# Patient Record
Sex: Female | Born: 1950 | Race: White | Hispanic: No | Smoking: Never smoker
Health system: Southern US, Community
[De-identification: ages and names within clinical notes are randomized; demographics above are authoritative.]

## PROBLEM LIST (undated history)

## (undated) DIAGNOSIS — K219 Gastro-esophageal reflux disease without esophagitis: Secondary | ICD-10-CM

## (undated) DIAGNOSIS — L409 Psoriasis, unspecified: Secondary | ICD-10-CM

## (undated) DIAGNOSIS — N8110 Cystocele, unspecified: Secondary | ICD-10-CM

## (undated) DIAGNOSIS — K921 Melena: Secondary | ICD-10-CM

## (undated) DIAGNOSIS — K5790 Diverticulosis of intestine, part unspecified, without perforation or abscess without bleeding: Secondary | ICD-10-CM

## (undated) DIAGNOSIS — D500 Iron deficiency anemia secondary to blood loss (chronic): Secondary | ICD-10-CM

## (undated) DIAGNOSIS — E039 Hypothyroidism, unspecified: Secondary | ICD-10-CM

---

## 2020-11-27 LAB — COLOGUARD: COLOGUARD: POSITIVE — AB

## 2021-08-02 ENCOUNTER — Emergency Department (HOSPITAL_BASED_OUTPATIENT_CLINIC_OR_DEPARTMENT_OTHER): Payer: Medicare HMO

## 2021-08-02 ENCOUNTER — Emergency Department (HOSPITAL_BASED_OUTPATIENT_CLINIC_OR_DEPARTMENT_OTHER)
Admission: EM | Admit: 2021-08-02 | Discharge: 2021-08-03 | Disposition: A | Discharge status: Home / Self Care | Payer: Medicare HMO | Attending: Emergency Medicine | Admitting: Emergency Medicine

## 2021-08-02 ENCOUNTER — Encounter (HOSPITAL_BASED_OUTPATIENT_CLINIC_OR_DEPARTMENT_OTHER): Payer: Self-pay | Admitting: Radiology

## 2021-08-02 DIAGNOSIS — K529 Noninfective gastroenteritis and colitis, unspecified: Secondary | ICD-10-CM | POA: Diagnosis not present

## 2021-08-02 DIAGNOSIS — K5900 Constipation, unspecified: Secondary | ICD-10-CM | POA: Insufficient documentation

## 2021-08-02 DIAGNOSIS — R1013 Epigastric pain: Secondary | ICD-10-CM | POA: Diagnosis present

## 2021-08-02 LAB — COMPREHENSIVE METABOLIC PANEL
ALT: 18 U/L (ref 0–44)
AST: 17 U/L (ref 15–41)
Albumin: 4.2 g/dL (ref 3.5–5.0)
Alkaline Phosphatase: 58 U/L (ref 38–126)
Anion gap: 12 (ref 5–15)
BUN: 12 mg/dL (ref 8–23)
CO2: 28 mmol/L (ref 22–32)
Calcium: 9.4 mg/dL (ref 8.9–10.3)
Chloride: 96 mmol/L — ABNORMAL LOW (ref 98–111)
Creatinine, Ser: 0.59 mg/dL (ref 0.44–1.00)
GFR, Estimated: >60 mL/min (ref >60)
Glucose, Bld: 125 mg/dL — ABNORMAL HIGH (ref 70–99)
Potassium: 3.4 mmol/L — ABNORMAL LOW (ref 3.5–5.1)
Sodium: 136 mmol/L (ref 135–145)
Total Bilirubin: 0.5 mg/dL (ref 0.3–1.2)
Total Protein: 6.8 g/dL (ref 6.5–8.1)

## 2021-08-02 LAB — CBC
HCT: 29.9 % — ABNORMAL LOW (ref 36.0–46.0)
Hemoglobin: 9.8 g/dL — ABNORMAL LOW (ref 12.0–15.0)
MCH: 29.5 pg (ref 26.0–34.0)
MCHC: 32.8 g/dL (ref 30.0–36.0)
MCV: 90.1 fL (ref 80.0–100.0)
Platelets: 442 10*3/uL — ABNORMAL HIGH (ref 150–400)
RBC: 3.32 MIL/uL — ABNORMAL LOW (ref 3.87–5.11)
RDW: 13.4 % (ref 11.5–15.5)
WBC: 9.5 10*3/uL (ref 4.0–10.5)
nRBC: 0 % (ref 0.0–0.2)

## 2021-08-02 LAB — TROPONIN I (HIGH SENSITIVITY): Troponin I (High Sensitivity): 5 ng/L (ref <18)

## 2021-08-02 LAB — LIPASE, BLOOD: Lipase: <10 U/L — ABNORMAL LOW (ref 11–51)

## 2021-08-02 MED ORDER — ALUM & MAG HYDROXIDE-SIMETH 200-200-20 MG/5ML PO SUSP
30.0000 mL | Freq: Once | ORAL | Status: AC
  Administered 2021-08-02: 30 mL via ORAL
  Filled 2021-08-02: qty 30

## 2021-08-02 MED ORDER — FAMOTIDINE IN NACL 20-0.9 MG/50ML-% IV SOLN
20.0000 mg | Freq: Once | INTRAVENOUS | Status: AC
  Administered 2021-08-02: 20 mg via INTRAVENOUS
  Filled 2021-08-02: qty 50

## 2021-08-02 MED ORDER — ONDANSETRON 4 MG PO TBDP: 4.0000 mg | ORAL_TABLET | Freq: Once | ORAL | Status: DC | PRN

## 2021-08-02 MED ORDER — SODIUM CHLORIDE 0.9 % IV BOLUS
1000.0000 mL | Freq: Once | INTRAVENOUS | Status: AC
  Administered 2021-08-02: 1000 mL via INTRAVENOUS

## 2021-08-02 MED ORDER — ONDANSETRON HCL 4 MG/2ML IJ SOLN
4.0000 mg | Freq: Once | INTRAMUSCULAR | Status: AC
  Administered 2021-08-02: 4 mg via INTRAVENOUS
  Filled 2021-08-02: qty 2

## 2021-08-02 MED ORDER — IOHEXOL 350 MG/ML SOLN
100.0000 mL | Freq: Once | INTRAVENOUS | Status: AC | PRN
  Administered 2021-08-02: 50 mL via INTRAVENOUS

## 2021-08-02 NOTE — ED Provider Notes (Signed)
[facility]ERGENCY [facility] Emergency Department Provider Note MRN:  [id number]  Arrival date & time: 08/03/21     Chief Complaint   Abdominal Pain   History of Present Illness   Allison Finley is a 70 y.o. year-old female with no pertinent past medical history presenting to the ED with chief complaint of abdominal pain.  Location: [facility] Duration: 3 weeks but worse over the past 3 days Onset: Gradual Timing: Was intermittent but now it is constant Description: Sharp Severity: Moderate Exacerbating/Alleviating Factors: Worse after meals Associated Symptoms: Nausea, vomiting, constipation Pertinent Negatives: Denies chest pain or shortness of breath, no fever, no lower abdominal pain   Review of Systems  A complete 10 system review of systems was obtained and all systems are negative except as noted in the HPI and PMH.   Patient's Health History   History reviewed. No pertinent past medical history.    No family history on file.  Social History   Socioeconomic History   Marital status: Divorced    Spouse name: Not on file   Number of children: Not on file   Years of education: Not on file   Highest education level: Not on file  Occupational History   Not on file  Tobacco Use   Smoking status: Not on file   Smokeless tobacco: Not on file  Substance and Sexual Activity   Alcohol use: Not on file   Drug use: Not on file   Sexual activity: Not on file  Other Topics Concern   Not on file  Social History Narrative   Not on file   Social Determinants of Health   Financial Resource Strain: Not on file  Food Insecurity: Not on file  Transportation Needs: Not on file  Physical Activity: Not on file  Stress: Not on file  Social Connections: Not on file  Intimate Partner Violence: Not on file     Physical Exam   Vitals:   08/03/21 0145 08/03/21 0200  BP: 103/63   Pulse: 77 74  Resp: 18 19  Temp:    SpO2: 93% 95%    CONSTITUTIONAL:  Well-appearing, NAD NEURO:  Alert and oriented x 3, no focal deficits EYES:  eyes equal and reactive ENT/NECK:  no LAD, no JVD CARDIO: Regular rate, well-perfused, normal S1 and S2 PULM:  CTAB no wheezing or rhonchi GI/GU:  normal bowel sounds, non-distended, non-tender MSK/SPINE:  No gross deformities, no edema SKIN:  no rash, atraumatic PSYCH:  Appropriate speech and behavior  *Additional and/or pertinent findings included in MDM below  Diagnostic and Interventional Summary    EKG Interpretation  Date/Time:  Friday August 02 2021 23:41:20 EDT Ventricular Rate:  71 PR Interval:  144 QRS Duration: 98 QT Interval:  381 QTC Calculation: 414 R Axis:   77 Text Interpretation: Sinus rhythm Confirmed by Kennis Carina [phone]) on 08/03/2021 1:48:19 AM       Labs Reviewed  LIPASE, BLOOD - Abnormal; Notable for the following components:      Result Value   Lipase <10 (*)    All other components within normal limits  COMPREHENSIVE METABOLIC PANEL - Abnormal; Notable for the following components:   Potassium 3.4 (*)    Chloride 96 (*)    Glucose, Bld 125 (*)    All other components within normal limits  CBC - Abnormal; Notable for the following components:   RBC 3.32 (*)    Hemoglobin 9.8 (*)    HCT 29.9 (*)    Platelets  442 (*)    All other components within normal limits  URINALYSIS, ROUTINE W REFLEX MICROSCOPIC - Abnormal; Notable for the following components:   Specific Gravity, Urine 1.031 (*)    Ketones, ur 40 (*)    All other components within normal limits  TROPONIN I (HIGH SENSITIVITY)  TROPONIN I (HIGH SENSITIVITY)    CT ABDOMEN PELVIS W CONTRAST  Final Result      Medications  ondansetron (ZOFRAN-ODT) disintegrating tablet 4 mg (has no administration in time range)  ondansetron (ZOFRAN) injection 4 mg (4 mg Intravenous Given 08/02/21 2229)  sodium chloride 0.9 % bolus 1,000 mL (0 mLs Intravenous Stopped 08/03/21 0033)  famotidine (PEPCID) IVPB 20 mg premix (0  mg Intravenous Stopped 08/02/21 2343)  alum & mag hydroxide-simeth (MAALOX/MYLANTA) 200-200-20 MG/5ML suspension 30 mL (30 mLs Oral Given 08/02/21 2307)  iohexol (OMNIPAQUE) 350 MG/ML injection 100 mL (50 mLs Intravenous Contrast Given 08/02/21 2315)     Procedures  /  Critical Care Procedures  ED Course and Medical Decision Making  I have reviewed the triage vital signs, the nursing notes, and pertinent available records from the EMR.  Listed above are laboratory and imaging tests that I personally ordered, reviewed, and interpreted and then considered in my medical decision making (see below for details).  Differential diagnosis includes gastritis, pancreatitis, biliary colic, SBO.  Also considering atypical presentation of ACS.  Awaiting EKG, labs, CT.     CT is overall reassuring, demonstrating an enteritis.  Patient continues to look and feel well, normal vital signs, appropriate for discharge.  Elmer Sow. Pilar Plate, MD [facility] Emergency Medicine [facility] [email or web address]  Final Clinical Impressions(s) / ED Diagnoses     ICD-10-CM   1. Enteritis  K52.9       ED Discharge Orders          Ordered    omeprazole (PRILOSEC) 20 MG capsule  Daily        08/03/21 0212    ondansetron (ZOFRAN ODT) 4 MG disintegrating tablet  Every 8 hours PRN        08/03/21 0212    sucralfate (CARAFATE) 1 g tablet  4 times daily PRN        08/03/21 0212             Discharge Instructions Discussed with and Provided to Patient:     Discharge Instructions      You were evaluated in the Emergency Department and after careful evaluation, we did not find any emergent condition requiring admission or further testing in the hospital.  Your exam/testing today is overall reassuring.  Symptoms seem to be due to an enteritis or inflammation of the small bowels, possibly due to a virus.  You may also have some underlying acid reflux disease.  Recommend taking the omeprazole  daily to prevent pain.  Take the Carafate medication up to 4 times daily for discomfort as needed for immediate relief.  Use the Zofran as needed for nausea.  If symptoms continue, recommend follow-up with primary care doctor.  Please return to the Emergency Department if you experience any worsening of your condition.   Thank you for allowing [place] to be a part of your care.        Sabas Sous, MD 08/03/21 [phone]

## 2021-08-02 NOTE — ED Triage Notes (Signed)
Pt to ED from home with c/o abd pain/NVD x48 hours. Pt states she has not been able to hold down water or solid food for the past 24 hours. Pt states that pain is upper abd and radiates to the left and right side.

## 2021-08-03 LAB — URINALYSIS, ROUTINE W REFLEX MICROSCOPIC
Bilirubin Urine: NEGATIVE
Glucose, UA: NEGATIVE mg/dL
Hgb urine dipstick: NEGATIVE
Ketones, ur: 40 mg/dL — AB
Leukocytes,Ua: NEGATIVE
Nitrite: NEGATIVE
Protein, ur: NEGATIVE mg/dL
Specific Gravity, Urine: 1.031 — ABNORMAL HIGH (ref 1.005–1.030)
pH: 7.5 (ref 5.0–8.0)

## 2021-08-03 MED ORDER — ONDANSETRON 4 MG PO TBDP
4.0000 mg | ORAL_TABLET | Freq: Three times a day (TID) | ORAL | 0 refills | Status: DC | PRN
Start: 2021-08-03 — End: 2024-05-19

## 2021-08-03 MED ORDER — SUCRALFATE 1 G PO TABS: 1.0000 g | ORAL_TABLET | Freq: Four times a day (QID) | ORAL | 0 refills | Status: DC | PRN

## 2021-08-03 MED ORDER — OMEPRAZOLE 20 MG PO CPDR: 20.0000 mg | DELAYED_RELEASE_CAPSULE | Freq: Every day | ORAL | 1 refills | Status: DC

## 2021-08-03 NOTE — Discharge Instructions (Addendum)
You were evaluated in the Emergency Department and after careful evaluation, we did not find any emergent condition requiring admission or further testing in the hospital.  Your exam/testing today is overall reassuring.  Symptoms seem to be due to an enteritis or inflammation of the small bowels, possibly due to a virus.  You may also have some underlying acid reflux disease.  Recommend taking the omeprazole daily to prevent pain.  Take the Carafate medication up to 4 times daily for discomfort as needed for immediate relief.  Use the Zofran as needed for nausea.  If symptoms continue, recommend follow-up with primary care doctor.  Please return to the Emergency Department if you experience any worsening of your condition.   Thank you for allowing [place] to be a part of your care.

## 2021-08-03 NOTE — ED Notes (Signed)
Pt verbalizes understanding of discharge instructions. Opportunity for questioning and answers were provided. Armand removed by staff, pt discharged from ED to home. Educated to pick up Rx.  

## 2021-12-22 HISTORY — PX: CATARACT EXTRACTION: SUR2

## 2023-01-17 IMAGING — CT CT ABD-PELV W/ CM
2 of 5 series · 16 of 46 positions shown, 18 images · IV contrast (APPLIED)
Comparison: None.

CLINICAL DATA: Nausea and vomiting with abdominal pain for 2 days,
initial encounter

EXAM:
CT ABDOMEN AND PELVIS WITH CONTRAST
TECHNIQUE: Multidetector CT imaging of the abdomen and pelvis was performed
using the standard protocol following bolus administration of
intravenous contrast.
CONTRAST:  50mL OMNIPAQUE IOHEXOL 350 MG/ML SOLN

[Series 2: abd pel w · axial · 0.64mm/px · z∈[+810,+1190]mm · 13 of 86 slices shown, 15 images]
[im 5/86  soft-tissue]
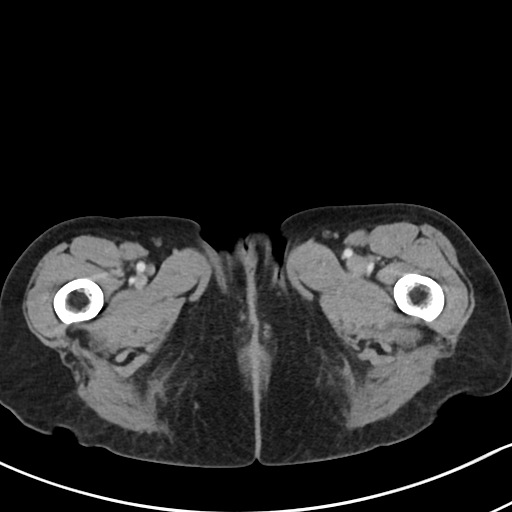
[im 5/86  bone]
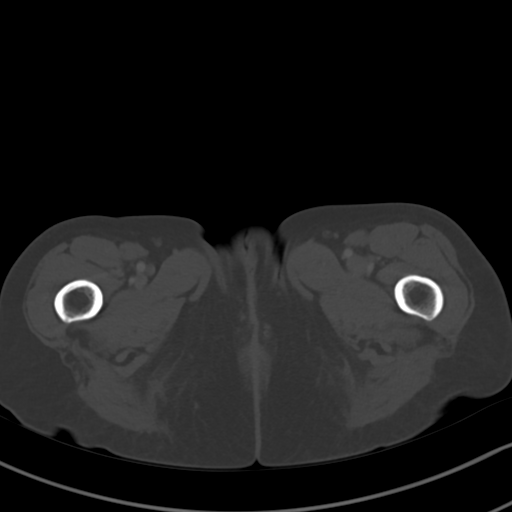
[im 10/86  soft-tissue]
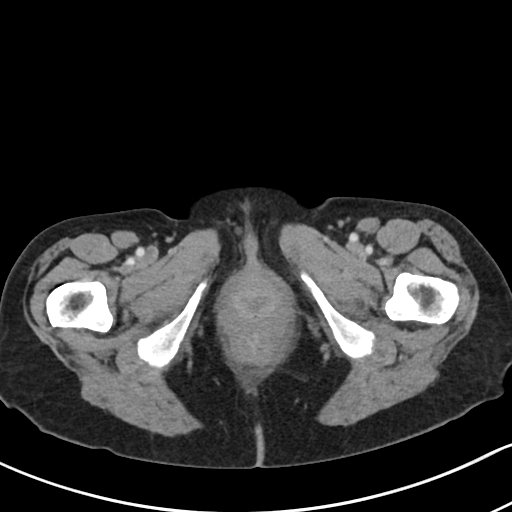
[im 19/86  soft-tissue]
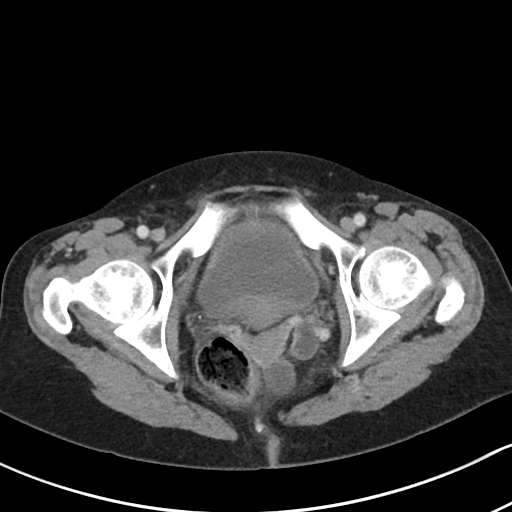
[im 24/86  soft-tissue]
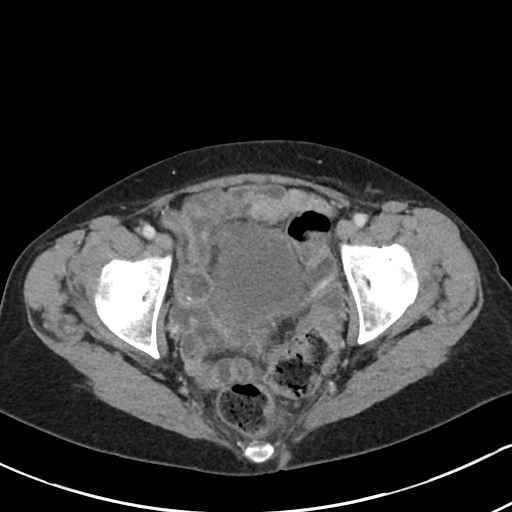
[im 29/86  soft-tissue]
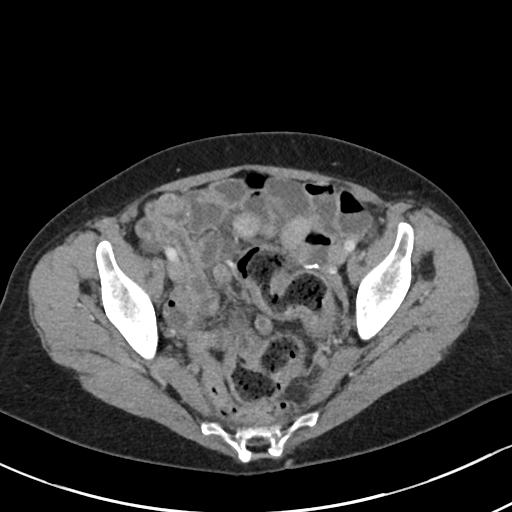
[im 38/86  soft-tissue]
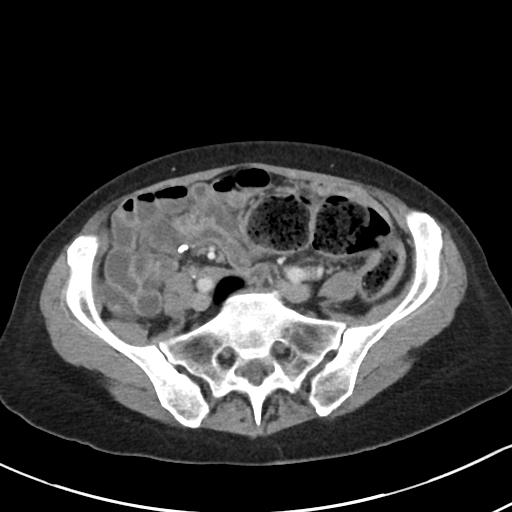
[im 43/86  soft-tissue]
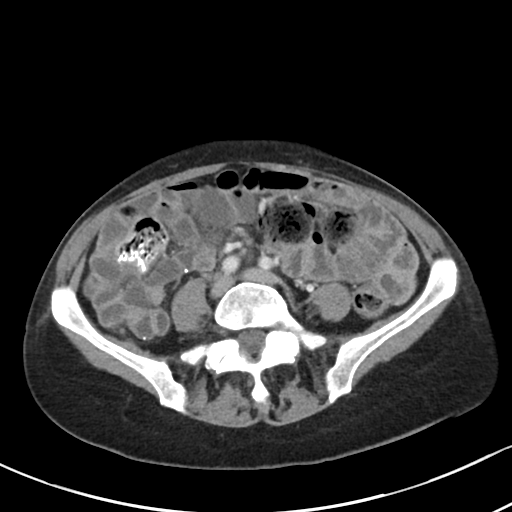
[im 48/86  soft-tissue]
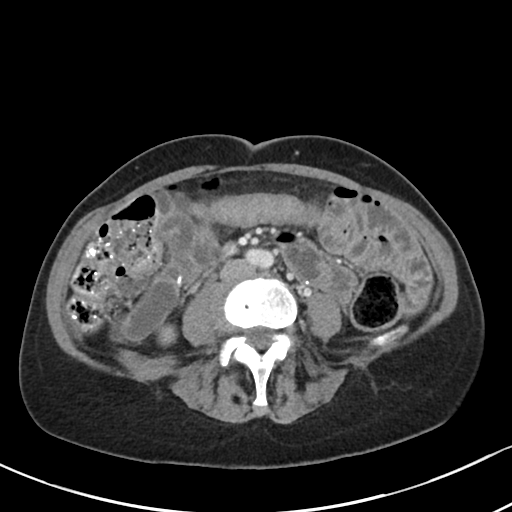
[im 57/86  soft-tissue]
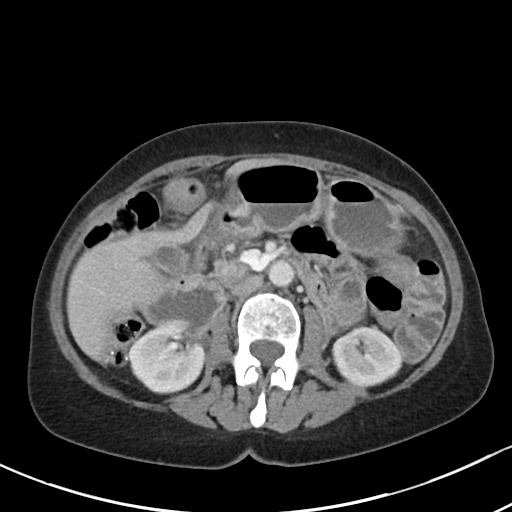
[im 57/86  bone]
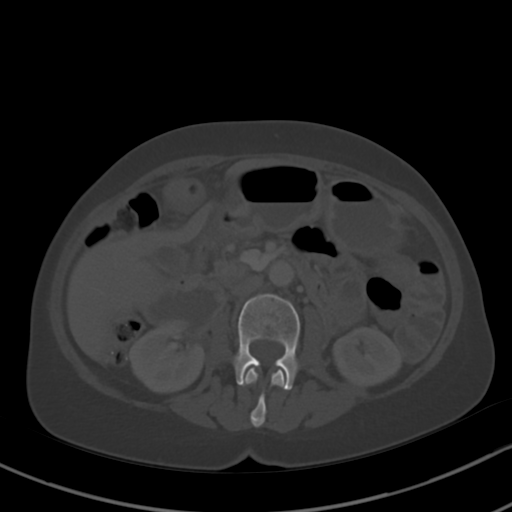
[im 62/86  soft-tissue]
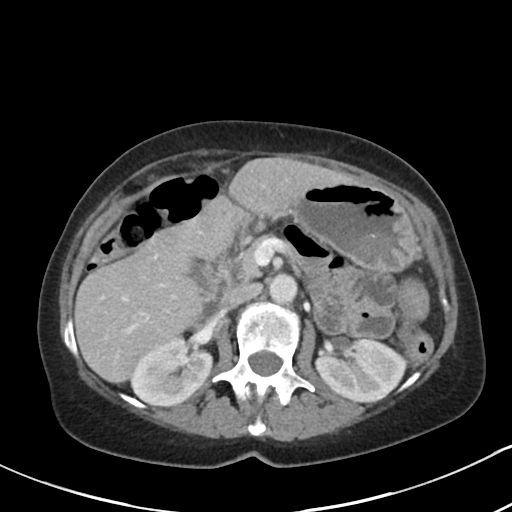
[im 67/86  soft-tissue]
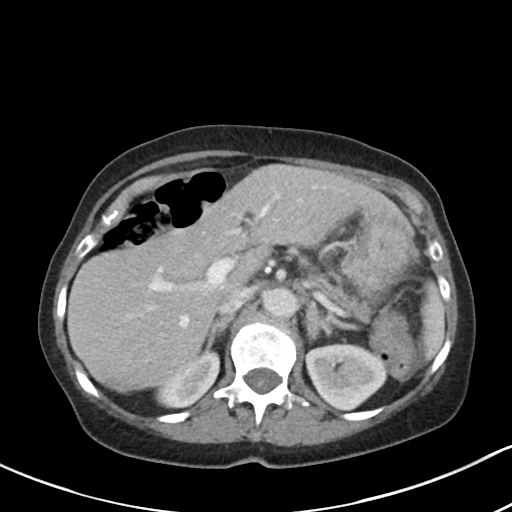
[im 76/86  soft-tissue]
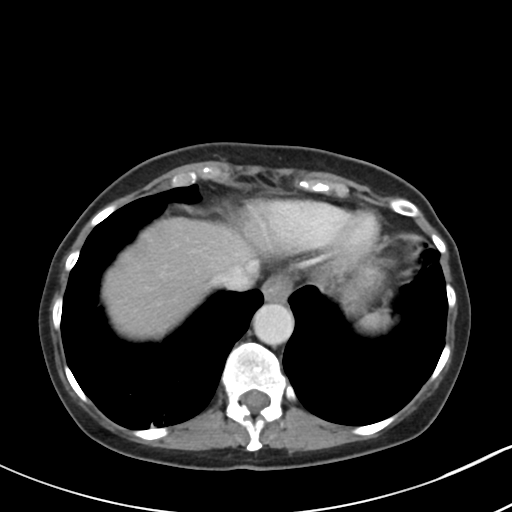
[im 81/86  soft-tissue]
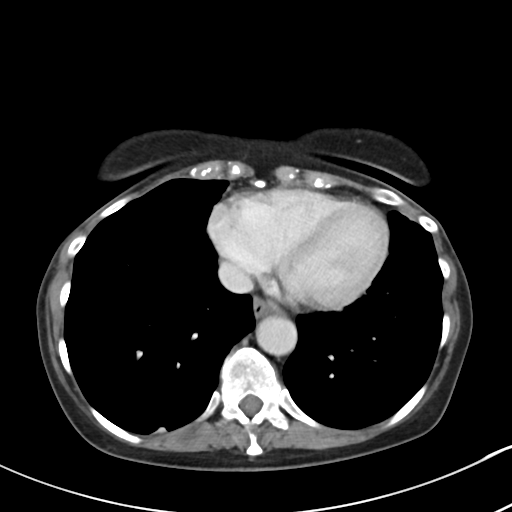

[Series 5: coronal · coronal · 0.67mm/px · 3 of 71 slices shown]
[im 24/71  soft-tissue]
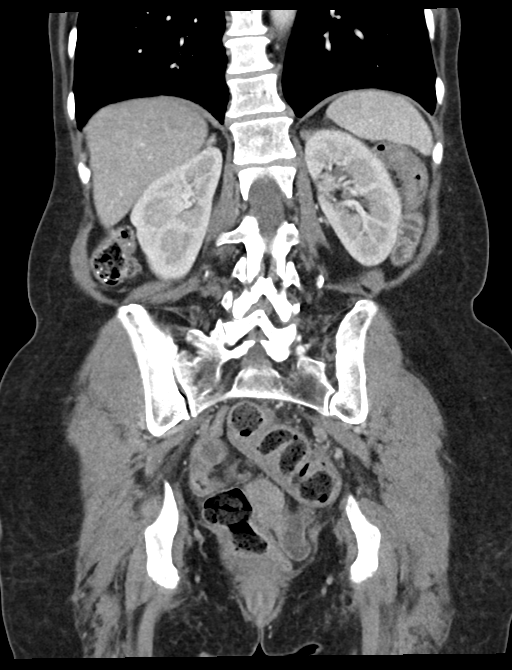
[im 32/71  soft-tissue]
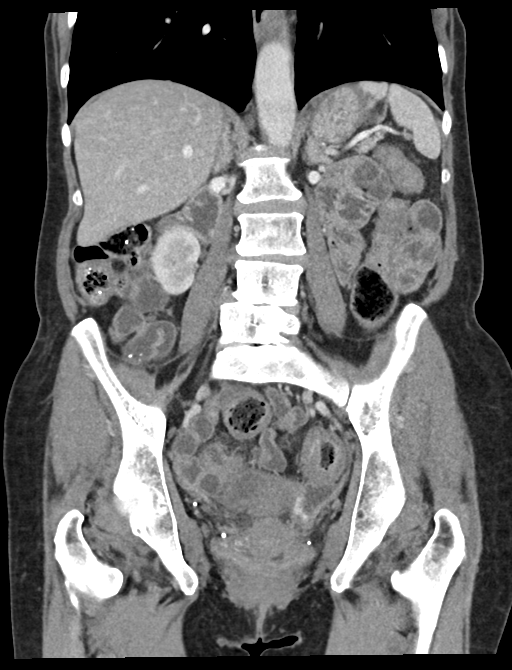
[im 39/71  soft-tissue]
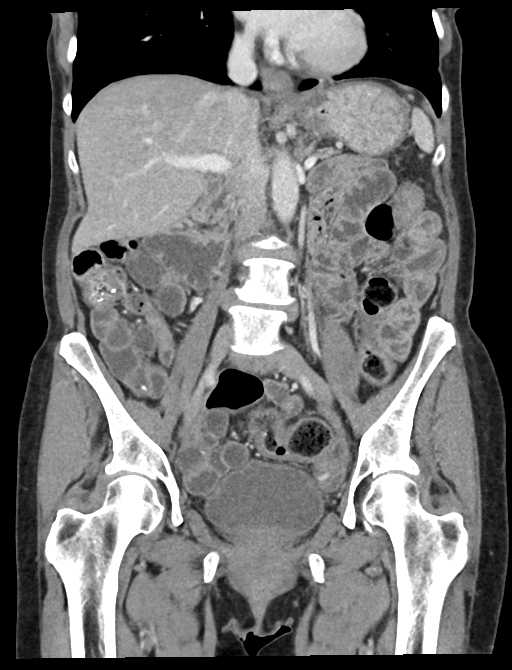

[16 of 46 positions shown; findings below may reference images not displayed]

FINDINGS: Lower chest: Scarring is noted in the right lower lobe

Hepatobiliary: Fatty infiltration of the liver is noted. Gallbladder
is within normal limits.

Pancreas: Unremarkable. No pancreatic ductal dilatation or
surrounding inflammatory changes.

Spleen: Normal in size without focal abnormality.

Adrenals/Urinary Tract: Adrenal glands are with within normal
limits. Kidneys demonstrate normal enhancement pattern bilaterally.
Normal excretion is noted on delayed images. No obstructive calculi
are seen. Ureters are within normal limits. Bladder is partially
distended.

Stomach/Bowel: Fecal material is noted throughout the left colon
consistent with a mild degree of constipation. Fluid-filled loops of
small bowel are noted without obstructive change. This may represent
a degree of enteritis. Stomach is unremarkable.

Vascular/Lymphatic: Aortic atherosclerosis. No enlarged abdominal or
pelvic lymph nodes.

Reproductive: Uterus and bilateral adnexa are unremarkable.

Other: No abdominal wall hernia or abnormality. No abdominopelvic
ascites.

Musculoskeletal: No acute or significant osseous findings.
IMPRESSION: Changes of mild colonic constipation.

Multiple fluid-filled loops of small bowel without obstructive
change. This may represent some mild enteritis.

## 2024-05-19 ENCOUNTER — Inpatient Hospital Stay (HOSPITAL_COMMUNITY)
Admission: EM | Admit: 2024-05-19 | Discharge: 2024-05-24 | DRG: 386 | Disposition: A | Discharge status: Home / Self Care | Attending: Internal Medicine | Admitting: Internal Medicine

## 2024-05-19 ENCOUNTER — Emergency Department (HOSPITAL_COMMUNITY)

## 2024-05-19 ENCOUNTER — Encounter (HOSPITAL_COMMUNITY): Payer: Self-pay

## 2024-05-19 ENCOUNTER — Other Ambulatory Visit: Payer: Self-pay

## 2024-05-19 DIAGNOSIS — K921 Melena: Secondary | ICD-10-CM | POA: Diagnosis not present

## 2024-05-19 DIAGNOSIS — Z79890 Hormone replacement therapy (postmenopausal): Secondary | ICD-10-CM

## 2024-05-19 DIAGNOSIS — K5130 Ulcerative (chronic) rectosigmoiditis without complications: Secondary | ICD-10-CM

## 2024-05-19 DIAGNOSIS — N814 Uterovaginal prolapse, unspecified: Secondary | ICD-10-CM | POA: Diagnosis present

## 2024-05-19 DIAGNOSIS — Z91018 Allergy to other foods: Secondary | ICD-10-CM

## 2024-05-19 DIAGNOSIS — K51911 Ulcerative colitis, unspecified with rectal bleeding: Principal | ICD-10-CM | POA: Diagnosis present

## 2024-05-19 DIAGNOSIS — K922 Gastrointestinal hemorrhage, unspecified: Principal | ICD-10-CM

## 2024-05-19 DIAGNOSIS — K219 Gastro-esophageal reflux disease without esophagitis: Secondary | ICD-10-CM | POA: Diagnosis present

## 2024-05-19 DIAGNOSIS — K5730 Diverticulosis of large intestine without perforation or abscess without bleeding: Secondary | ICD-10-CM | POA: Diagnosis present

## 2024-05-19 DIAGNOSIS — K5190 Ulcerative colitis, unspecified, without complications: Secondary | ICD-10-CM | POA: Diagnosis present

## 2024-05-19 DIAGNOSIS — N8110 Cystocele, unspecified: Secondary | ICD-10-CM | POA: Diagnosis present

## 2024-05-19 DIAGNOSIS — R809 Proteinuria, unspecified: Secondary | ICD-10-CM | POA: Diagnosis present

## 2024-05-19 DIAGNOSIS — K5900 Constipation, unspecified: Secondary | ICD-10-CM | POA: Diagnosis present

## 2024-05-19 DIAGNOSIS — Z91014 Allergy to mammalian meats: Secondary | ICD-10-CM

## 2024-05-19 DIAGNOSIS — R609 Edema, unspecified: Secondary | ICD-10-CM | POA: Diagnosis present

## 2024-05-19 DIAGNOSIS — K529 Noninfective gastroenteritis and colitis, unspecified: Secondary | ICD-10-CM

## 2024-05-19 DIAGNOSIS — A09 Infectious gastroenteritis and colitis, unspecified: Secondary | ICD-10-CM | POA: Diagnosis present

## 2024-05-19 DIAGNOSIS — E785 Hyperlipidemia, unspecified: Secondary | ICD-10-CM | POA: Diagnosis present

## 2024-05-19 DIAGNOSIS — Z79899 Other long term (current) drug therapy: Secondary | ICD-10-CM

## 2024-05-19 DIAGNOSIS — D500 Iron deficiency anemia secondary to blood loss (chronic): Secondary | ICD-10-CM | POA: Diagnosis present

## 2024-05-19 DIAGNOSIS — K644 Residual hemorrhoidal skin tags: Secondary | ICD-10-CM | POA: Diagnosis present

## 2024-05-19 DIAGNOSIS — E871 Hypo-osmolality and hyponatremia: Secondary | ICD-10-CM | POA: Diagnosis present

## 2024-05-19 DIAGNOSIS — E039 Hypothyroidism, unspecified: Secondary | ICD-10-CM | POA: Diagnosis present

## 2024-05-19 DIAGNOSIS — D509 Iron deficiency anemia, unspecified: Secondary | ICD-10-CM

## 2024-05-19 DIAGNOSIS — K8689 Other specified diseases of pancreas: Secondary | ICD-10-CM | POA: Diagnosis present

## 2024-05-19 LAB — COMPREHENSIVE METABOLIC PANEL WITH GFR
ALT: 19 U/L (ref 0–44)
AST: 18 U/L (ref 15–41)
Albumin: 3 g/dL — ABNORMAL LOW (ref 3.5–5.0)
Alkaline Phosphatase: 59 U/L (ref 38–126)
Anion gap: 9 (ref 5–15)
BUN: 11 mg/dL (ref 8–23)
CO2: 25 mmol/L (ref 22–32)
Calcium: 8.8 mg/dL — ABNORMAL LOW (ref 8.9–10.3)
Chloride: 99 mmol/L (ref 98–111)
Creatinine, Ser: 0.7 mg/dL (ref 0.44–1.00)
GFR, Estimated: >60 mL/min (ref >60)
Glucose, Bld: 106 mg/dL — ABNORMAL HIGH (ref 70–99)
Potassium: 4.2 mmol/L (ref 3.5–5.1)
Sodium: 133 mmol/L — ABNORMAL LOW (ref 135–145)
Total Bilirubin: 0.5 mg/dL (ref 0.0–1.2)
Total Protein: 6.2 g/dL — ABNORMAL LOW (ref 6.5–8.1)

## 2024-05-19 LAB — CBC WITH DIFFERENTIAL/PLATELET
Abs Immature Granulocytes: 0.1 10*3/uL — ABNORMAL HIGH (ref 0.00–0.07)
Basophils Absolute: 0.1 10*3/uL (ref 0.0–0.1)
Basophils Relative: 1 %
Eosinophils Absolute: 0.6 10*3/uL — ABNORMAL HIGH (ref 0.0–0.5)
Eosinophils Relative: 6 %
HCT: 31.2 % — ABNORMAL LOW (ref 36.0–46.0)
Hemoglobin: 10 g/dL — ABNORMAL LOW (ref 12.0–15.0)
Immature Granulocytes: 1 %
Lymphocytes Relative: 16 %
Lymphs Abs: 1.8 10*3/uL (ref 0.7–4.0)
MCH: 27.9 pg (ref 26.0–34.0)
MCHC: 32.1 g/dL (ref 30.0–36.0)
MCV: 87.2 fL (ref 80.0–100.0)
Monocytes Absolute: 1.6 10*3/uL — ABNORMAL HIGH (ref 0.1–1.0)
Monocytes Relative: 14 %
Neutro Abs: 7 10*3/uL (ref 1.7–7.7)
Neutrophils Relative %: 62 %
Platelets: 349 10*3/uL (ref 150–400)
RBC: 3.58 MIL/uL — ABNORMAL LOW (ref 3.87–5.11)
RDW: 12.8 % (ref 11.5–15.5)
WBC: 11.2 10*3/uL — ABNORMAL HIGH (ref 4.0–10.5)
nRBC: 0 % (ref 0.0–0.2)

## 2024-05-19 LAB — CBC
HCT: 33.8 % — ABNORMAL LOW (ref 36.0–46.0)
Hemoglobin: 11 g/dL — ABNORMAL LOW (ref 12.0–15.0)
MCH: 28.4 pg (ref 26.0–34.0)
MCHC: 32.5 g/dL (ref 30.0–36.0)
MCV: 87.1 fL (ref 80.0–100.0)
Platelets: 397 10*3/uL (ref 150–400)
RBC: 3.88 MIL/uL (ref 3.87–5.11)
RDW: 13.1 % (ref 11.5–15.5)
WBC: 12.5 10*3/uL — ABNORMAL HIGH (ref 4.0–10.5)
nRBC: 0 % (ref 0.0–0.2)

## 2024-05-19 LAB — URINALYSIS, ROUTINE W REFLEX MICROSCOPIC
Bilirubin Urine: NEGATIVE
Glucose, UA: NEGATIVE mg/dL
Ketones, ur: 5 mg/dL — AB
Nitrite: NEGATIVE
Protein, ur: 30 mg/dL — AB
Specific Gravity, Urine: 1.027 (ref 1.005–1.030)
pH: 5 (ref 5.0–8.0)

## 2024-05-19 LAB — TYPE AND SCREEN
ABO/RH(D): A POS
Antibody Screen: NEGATIVE

## 2024-05-19 LAB — POC OCCULT BLOOD, ED: Fecal Occult Bld: POSITIVE — AB

## 2024-05-19 MED ORDER — LACTATED RINGERS IV BOLUS
1000.0000 mL | Freq: Once | INTRAVENOUS | Status: AC
  Administered 2024-05-19: 1000 mL via INTRAVENOUS

## 2024-05-19 MED ORDER — LORAZEPAM 2 MG/ML PO CONC: 2.0000 mg | Freq: Once | ORAL | Status: DC | PRN

## 2024-05-19 MED ORDER — IOHEXOL 350 MG/ML SOLN
70.0000 mL | Freq: Once | INTRAVENOUS | Status: AC | PRN
  Administered 2024-05-19: 70 mL via INTRAVENOUS

## 2024-05-19 MED ORDER — PANTOPRAZOLE SODIUM 40 MG IV SOLR
80.0000 mg | Freq: Once | INTRAVENOUS | Status: AC
  Administered 2024-05-19: 80 mg via INTRAVENOUS
  Filled 2024-05-19: qty 20

## 2024-05-19 MED ORDER — HYDROCORTISONE 1 % EX CREA
TOPICAL_CREAM | Freq: Three times a day (TID) | CUTANEOUS | Status: DC
  Administered 2024-05-20 – 2024-05-21 (×2): 1 via TOPICAL
  Filled 2024-05-19: qty 28

## 2024-05-19 MED ORDER — PANTOPRAZOLE SODIUM 20 MG PO TBEC
20.0000 mg | DELAYED_RELEASE_TABLET | Freq: Every day | ORAL | Status: DC
  Administered 2024-05-20 – 2024-05-24 (×4): 20 mg via ORAL
  Filled 2024-05-19 (×5): qty 1

## 2024-05-19 MED ORDER — ROSUVASTATIN CALCIUM 5 MG PO TABS
5.0000 mg | ORAL_TABLET | Freq: Every day | ORAL | Status: DC
  Administered 2024-05-19 – 2024-05-24 (×5): 5 mg via ORAL
  Filled 2024-05-19 (×7): qty 1

## 2024-05-19 MED ORDER — LEVOTHYROXINE SODIUM 112 MCG PO TABS
112.0000 ug | ORAL_TABLET | Freq: Every day | ORAL | Status: DC
  Administered 2024-05-20 – 2024-05-24 (×4): 112 ug via ORAL
  Filled 2024-05-19 (×4): qty 1

## 2024-05-19 NOTE — Hospital Course (Addendum)
 Allison Finley is a 64 F with a history of diverticular disease, GERD, hypothyroidism, hyperlipidemia, herpes zoster who presented to the ED for bloody diarrhea and was admitted to the [facility]MT[facility] on 5/29 for further workup and management of hematochezia.    Hematochezia Hemorrhoids  Concern for colitis on imaging Leukocytosis (12.5, 11.2) Patient is a well-appearing and hemodynamically stable 73 yo who presents for 2-3 week history of diarrheal illness/hematochezia. Pertinent positives include multiple, daily melanotic stools, dizziness, and weakness. Pertinent negatives include absent fever, no nausea/vomiting, no weight loss, no confusion, and no sick contacts. CBC on admission remarkable for Hgb 11 and 10 on repeat as well as positive FOBT. Rectal exam performed by ED provider positive for external, non-thrombosed hemorrhoids. No tenderness, distention, guarding, rebound, or active bleeding noted on exam. CTA GI bleed protocol showed findings concerning for active inflammation consistent with colitis of the cecum, ascending colon, and rectosigmoid as well as constipation.    Concern for lower GI bleed given CBC from 4 months ago with Hgb 14 now 10 in the setting of positive FOBT, history of diverticular disease, and findings consistent with colitis on CTA. Low concern for perforation/peritonitis based on reassuring physical exam. GI consulted in ED for colonoscopy when appropriate. Last colonoscopy in 2022 reassuring. Given patient's well appearance and current inflammation GI may defer colonoscopy to outpatient setting. Appreciate their recommendations. Bleeding could also be from hemorrhoids although they appear asymptomatic at this time. Given history of low Hgb in the past have also ordered iron panel to help characterize anemia. Will monitor Hgb and transfuse as needed. Stopped IV PPI in the absence of active bleeding and resumed home PPI dose.    Bleeding and diarrhea could be concerning for  dysentery given mild leukocytosis, although patient does appear well and has remained afebrile. Agree with GI panel given high risk features including age >44 and persistence of symptoms for more than 1 week. Will defer antibiotic therapy until GI panel returns. Separately, diarrhea following meals and in the absence of a rash could be due to due to a more mild gastric enteritis that should resolve with time. Patient has only had 1 episode of diarrhea today. Loose stools could also be encopresis due to constipation seen on imaging. Other diagnoses to consider that are lower on the differential given labs/imaging/clinical presentation include diverticular bleed, ischemic colitis, microscopic colitis, mesenteric ischemia, and protein allergy.  Patient was given lactated Ringer 's in the ED.  Started on PPI 20 mg daily.  On evaluation in the morning, patient was stable, denied any dizziness or lightheadedness.  Denies any chest pain or shortness of breath.  Denied any acute abdominal pain reported soreness in the lower abdomen.  Tolerating p.o. okay.  Reports that she had 2 bowel movements this morning.  Labs this morning showed stable hemoglobin of 10.1 compared to yesterday 10.0; iron/TIBC showed low saturation ratio 5, iron 16, ferritin 22.  GI on board, plan for outpatient colonoscopy.  Will plan on outpatient IV iron supplementation.   Pancreas CT abdomen/pelvis 2022 for nausea/vomiting did not show pancreatic ductal dilatation or surrounding inflammatory changes. CT here noted atrophy of the body and tail of the pancreas with mild dilation of the main pancreatic duct. No discrete mass noted.Changes possibly related to chronic pancreatitis. Today patient denies epigastric pain, Alk phos WNL. Recommend outpatient monitoring should patient become symptomatic.   Proteinuria Present in UA. Asymptomatic. Recommend following up with PCP.    Chronic health problems   Hypothyroidism TSH  of 5.120 WNL on  03/04/2024 ([facility]). On levothyroxine  112 mcg daily at home. Resumed here.    Hyperlipidemia On rosuvastatin  5 mg daily at bedtime at home. Resumed here.   ===================================================================  Allison Finley, Allison Finley came to the hospital for blood in your stool.  Will check your labs, your stool studies were negative for infection.  Your hemoglobin stayed up without a drop.  - Please make sure you follow-up with gastroenterology in 2 weeks. - Please avoid any ibuprofen, Aleve, Advil, Goody powders; as it can increase the risk of bleeding from the gut - Please make sure you follow-up with your primary care doctor for a repeat of hemoglobin in 1 week - Please make sure you drink plenty of water and stay hydrated, and eat well.  These are new medications: Start prednisone as follows: - Take 4 tablets, 40 mg, starting tomorrow 05/25/2024 until 06/03/2024.  Then: - Take 3 tablets, 30 mg, starting 06/04/2024 until 06/13/2024.  Then - Take 2 tablets, 20 mg, starting 06/10/2024 until 06/23/2024 then: - Take 1 tablet, 10 mg, starting 7//2025 until 07/03/2024.  I am also sending you home with Anusol  suppository, she can place 1 suppository rectally as needed for up to 2 times a day for hemorrhoids or anal itching.  You will be contacted for iron replacement therapy through IV.  Otherwise please continue the rest of medications as prescribed.   If you have any of these following symptoms, please call us  or seek care at an emergency department: -Chest Pain -Difficulty Breathing -Worsening abdominal pain -Syncope (passing out) -Drooping of face -Slurred speech -Sudden weakness in your leg or arm -Fever -Chills -blood in the stool -dark black, sticky stool  If you have any questions or concerns, call our clinic at [phone] or after hours call [phone] and ask for the internal medicine resident on call.  I am glad you are feeling better. It was a  pleasure taking care for you. I wish a good recovery and good health!   Dr. Lanney Pitts   =============================================================================  Disposition  Hematochezia/hemorrhoids/colitis/leukocytosis:

## 2024-05-19 NOTE — ED Notes (Signed)
Back from CT, no changes, alert, NAD, calm, interactive.  

## 2024-05-19 NOTE — ED Notes (Signed)
 Report attempted

## 2024-05-19 NOTE — ED Notes (Signed)
 Not in room

## 2024-05-19 NOTE — Progress Notes (Signed)
 Pt arrived at the unit alert, oriented x 4 ambulatory. No other complaint as of the moment. Awaiting for stool sample. All pt needs attended. Plan of care ongoing.
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
## 2024-05-19 NOTE — ED Provider Notes (Signed)
 Supervised resident visit.  Patient here with several days of blood in her stool.  No history of GI bleeds not on anticoagulation.  States that its red and sometimes dark.  She has some abdominal cramping.  She has positive Hemoccult.  Hemoglobin 11 today.  Down from 14 from prior.  Rechecked hemoglobin 4 hours later and dropped another gram to 10.  CT showed no evidence of active GI bleed.  But did show colitis of the cecum colon and rectosigmoid.  Overall given drop in hemoglobin we did talk with GI and we will admit overnight.  Will continue to trend.  But suspect this could be infectious process.  This chart was dictated using voice recognition software.  Despite best efforts to proofread,  errors can occur which can change the documentation meaning.    Allison Rue, DO 05/19/24 [phone]

## 2024-05-19 NOTE — ED Triage Notes (Signed)
 Reports for the last 3 days having diarrhea after eating and seeing blood in stool but was worse today.  Called PCP and told to come here.  Patient reports lower abd soreness from all the diarrhea.

## 2024-05-19 NOTE — ED Provider Notes (Signed)
 [place] EMERGENCY DEPARTMENT AT [facility] Provider Note  History  Chief Complaint:  Rectal Bleeding   Rectal Bleeding    Allison Finley is a 73 y.o. female with no significant past medical history who presents to the emergency department for extensive diarrhea over the past 2 weeks.  She reports that every time she eats it comes right back out.  She then noticed some bleeding.  She describes this bleeding as bright red blood when she wipes.  She also noticed some dark stool.  She is on no anticoagulation.  She has no fever, shortness of breath or chest pain.  She denies any excessive aspirin or ibuprofen use.  Has never had this before.  Otherwise healthy.  States that she does have some lower abdominal cramping however reports no pain.  History reviewed. No pertinent past medical history.  History reviewed. No pertinent surgical history.  History reviewed. No pertinent family history.  Social History   Tobacco Use   Smoking status: Never   Smokeless tobacco: Never  Vaping Use   Vaping status: Never Used  Substance Use Topics   Alcohol use: Never   Drug use: Never    Review of Systems  Review of Systems  Gastrointestinal:  Positive for hematochezia.     Reviewed and documented in HPI if pertinent.   Physical Exam   ED Triage Vitals  Encounter Vitals Group     BP 05/19/24 1017 123/73     Systolic BP Percentile --      Diastolic BP Percentile --      Pulse Rate 05/19/24 1017 92     Resp 05/19/24 1017 20     Temp 05/19/24 1017 98.4 F (36.9 C)     Temp src --      SpO2 05/19/24 1017 98 %     Weight 05/19/24 1026 128 lb (58.1 kg)     Height 05/19/24 1026 5\' 2"  (1.575 m)     Head Circumference --      Peak Flow --      Pain Score 05/19/24 1026 2     Pain Loc --      Pain Education --      Exclude from Growth Chart --      Physical Exam Vitals and nursing note reviewed. Exam conducted with a chaperone present Ethyl Hering, RN present for  rectal exam).  Constitutional:      General: She is not in acute distress.    Appearance: She is well-developed.  HENT:     Head: Normocephalic and atraumatic.  Eyes:     Conjunctiva/sclera: Conjunctivae normal.  Cardiovascular:     Rate and Rhythm: Normal rate and regular rhythm.     Heart sounds: No murmur heard. Pulmonary:     Effort: Pulmonary effort is normal. No respiratory distress.     Breath sounds: Normal breath sounds.  Abdominal:     Palpations: Abdomen is soft.     Tenderness: There is no abdominal tenderness.  Genitourinary:    Comments: Rectal exam reveals external hemorrhoids nonthrombosed; no active bleeding; mucoid stool within the vault that is fecal occult positive Musculoskeletal:        General: No swelling.     Cervical back: Neck supple.  Skin:    General: Skin is warm and dry.     Capillary Refill: Capillary refill takes less than 2 seconds.  Neurological:     Mental Status: She is alert.  Psychiatric:  Mood and Affect: Mood normal.      Procedures   Procedures  ED Course - Medical Decision Making  Brief Overview [name] is a 73 y.o. female who presents as per above.  I have reviewed the nursing documentation for past medical history, family history, and social history and agree.  I have reviewed the patient's vital signs. There are no abnormalities.  Initial Differential Diagnoses: I am primarily concerned for diverticular bleed, upper GI bleed, diverticulosis, colitis, anemia, electrolyte abnormalities.  Therapies: These medications and interventions were provided for the patient while in the ED.  Medications  lactated ringers bolus 1,000 mL (has no administration in time range)  iohexol  (OMNIPAQUE ) 350 MG/ML injection 70 mL (70 mLs Intravenous Contrast Given 05/19/24 1649)  pantoprazole (PROTONIX) injection 80 mg (80 mg Intravenous Given 05/19/24 1831)    Testing Results: On my interpretation labs are  significant for : Initial hemoglobin 11, repeat 10 Mild leukocytosis Creatinine stable UA contaminated  On my interpretation imaging is significant for: CT reveals reveals colitis  See the EMR for full details regarding lab and imaging results.   Medical Decision Making 74 year old female presents the emergency department for rectal bleeding, diarrhea.  She reports that the diarrhea has been going on for 2 weeks.  Recent onset of rectal bleeding.  She describes it as bright red blood when she wipes however has noted black stools.  On exam patient does have mild tenderness in the bilateral lower quadrants.  Her abdomen is not peritonitic.  There is no distention.  Patient has no stigmata of cirrhosis on exam.  Her lungs and heart are clear to auscultation.  She has no peripheral edema.  Rectal exam reveals external hemorrhoids that are nonthrombosed and brown mucoid occult positive stool.  No bright red blood per rectum on my exam.  Laboratory studies reveal hemoglobin of 11.  Mild leukocytosis.  Creatinine stable.  We did perform a repeat CBC to have develop a trend.  It appears the patient's hemoglobin has went from 11 to 10.  Given that patient does have a downward hemoglobin trend I have given patient's Protonix, consulted GI and will admit the patient for hematocrit/hemoglobin monitoring.  We also performed CT imaging of the abdomen to identify secondary causes of gastrointestinal bleeding.  Patient is on no blood thinners or antiplatelet agents that need to be reversed.  I did discuss with internal medicine and they will admit the patient.  GI states that they will see the patient in the morning.  Problems Addressed: Gastrointestinal hemorrhage, unspecified gastrointestinal hemorrhage type: acute illness or injury that poses a threat to life or bodily functions  Amount and/or Complexity of Data Reviewed Labs: ordered. Decision-making details documented in ED Course. Radiology:  ordered.  Risk Prescription drug management. Decision regarding hospitalization.     ### All radiography studies, electrocardiograms, and laboratory data were personally reviewed by me and incorporated into my medical decision making. Impression   1. Gastrointestinal hemorrhage, unspecified gastrointestinal hemorrhage type      Note: Dragon medical dictation software was used in the creation of this note.     Arminda Landmark, MD 05/19/24 [address] DO 05/19/24 2224

## 2024-05-19 NOTE — ED Notes (Signed)
 EDP at [facility]

## 2024-05-19 NOTE — ED Notes (Signed)
 EDP at [facility] for rectal exam, chaperone present

## 2024-05-20 DIAGNOSIS — K921 Melena: Secondary | ICD-10-CM | POA: Diagnosis not present

## 2024-05-20 DIAGNOSIS — K529 Noninfective gastroenteritis and colitis, unspecified: Secondary | ICD-10-CM | POA: Diagnosis not present

## 2024-05-20 DIAGNOSIS — K5190 Ulcerative colitis, unspecified, without complications: Secondary | ICD-10-CM | POA: Diagnosis present

## 2024-05-20 HISTORY — DX: Ulcerative colitis, unspecified, without complications: K51.90

## 2024-05-20 LAB — GASTROINTESTINAL PANEL BY PCR, STOOL (REPLACES STOOL CULTURE)

## 2024-05-20 LAB — BASIC METABOLIC PANEL WITH GFR
Anion gap: 8 (ref 5–15)
BUN: 9 mg/dL (ref 8–23)
CO2: 22 mmol/L (ref 22–32)
Calcium: 8.2 mg/dL — ABNORMAL LOW (ref 8.9–10.3)
Chloride: 101 mmol/L (ref 98–111)
Creatinine, Ser: 0.67 mg/dL (ref 0.44–1.00)
GFR, Estimated: >60 mL/min (ref >60)
Glucose, Bld: 100 mg/dL — ABNORMAL HIGH (ref 70–99)
Potassium: 3.8 mmol/L (ref 3.5–5.1)
Sodium: 131 mmol/L — ABNORMAL LOW (ref 135–145)

## 2024-05-20 LAB — CBC
HCT: 30.9 % — ABNORMAL LOW (ref 36.0–46.0)
Hemoglobin: 10.1 g/dL — ABNORMAL LOW (ref 12.0–15.0)
MCH: 28 pg (ref 26.0–34.0)
MCHC: 32.7 g/dL (ref 30.0–36.0)
MCV: 85.6 fL (ref 80.0–100.0)
Platelets: 370 10*3/uL (ref 150–400)
RBC: 3.61 MIL/uL — ABNORMAL LOW (ref 3.87–5.11)
RDW: 12.9 % (ref 11.5–15.5)
WBC: 10.6 10*3/uL — ABNORMAL HIGH (ref 4.0–10.5)
nRBC: 0 % (ref 0.0–0.2)

## 2024-05-20 LAB — IRON AND TIBC
Iron: 16 ug/dL — ABNORMAL LOW (ref 28–170)
Saturation Ratios: 5 % — ABNORMAL LOW (ref 10.4–31.8)
TIBC: 323 ug/dL (ref 250–450)
UIBC: 307 ug/dL

## 2024-05-20 LAB — FERRITIN: Ferritin: 22 ng/mL (ref 11–307)

## 2024-05-20 MED ORDER — SODIUM CHLORIDE 0.9 % IV SOLN: INTRAVENOUS | Status: DC

## 2024-05-20 MED ORDER — FLEET ENEMA RE ENEM
2.0000 | ENEMA | Freq: Once | RECTAL | Status: AC
  Administered 2024-05-21: 2 via RECTAL
  Filled 2024-05-20: qty 2

## 2024-05-20 NOTE — Plan of Care (Signed)

## 2024-05-20 NOTE — H&P (View-Only) (Signed)
 Reason for Consult: Colitis and diarrhea Referring Physician: Teaching Service  [facility] HPI: This is a 73 year old female with a PMH of GERD, hyperlipidemia, and hypothyroidism admitted for complaints of bloody diarrhea.  Her symptoms started a two weeks ago and she averaged multiple loose bowel movements a day that progressively turned into watery diarrhea.  She had diarrhea with or without PO intake, but PO intake certainly accelerated her diarrhea.  The patient self-medicated with Peptobismol and it turned her stools black, however, she did report having some black stools before using Pepto.  She does not report any issues with nausea, vomiting, or fever, but there is some lower abdominal "sensitivity".  As her symptoms progressed she started to notice some hematochezia and this was one of the reasons for her presentation to the hospital.  She was also feeling weak when she was at work.  In the ER a CT agio was performed and she was identified to have a pan-colitis.  Her CBC showed that her HGB was 10.1 g/dL, but compared to her CBC on 12/2023 there was a marked decline.  Her HGB on 12/2023 was at 14 g/dL.  The patient's last colonoscopy was on 01/03/2021 and it was performed for a positive Cologuard.  Diverticula were the only finding.  She had a good prep and the pull out time was 15 minutes.  The patient denies taking any new supplement or being on any new medications.  Since being hospitalized she continues to experience diarrhea.  History reviewed. No pertinent past medical history.  History reviewed. No pertinent surgical history.  History reviewed. No pertinent family history.  Social History:  reports that she has never smoked. She has never used smokeless tobacco. She reports that she does not drink alcohol and does not use drugs.  Allergies:  Allergies  Allergen Reactions   Beef-Derived Drug Products    Chicken Allergy    Pork-Derived Products Other (See Comments)     vegetarian    Medications: Scheduled:  hydrocortisone cream   Topical TID   levothyroxine  112 mcg Oral Q0600   pantoprazole  20 mg Oral Daily   rosuvastatin  5 mg Oral Daily   Continuous:  Results for orders placed or performed during the hospital encounter of 05/19/24 (from the past 24 hours)  Urinalysis, Routine w reflex microscopic -Urine, Clean Catch     Status: Abnormal   Collection Time: 05/19/24  1:54 PM  Result Value Ref Range   Color, Urine AMBER (A) YELLOW   APPearance CLOUDY (A) CLEAR   Specific Gravity, Urine 1.027 1.005 - 1.030   pH 5.0 5.0 - 8.0   Glucose, UA NEGATIVE NEGATIVE mg/dL   Hgb urine dipstick SMALL (A) NEGATIVE   Bilirubin Urine NEGATIVE NEGATIVE   Ketones, ur 5 (A) NEGATIVE mg/dL   Protein, ur 30 (A) NEGATIVE mg/dL   Nitrite NEGATIVE NEGATIVE   Leukocytes,Ua TRACE (A) NEGATIVE   RBC / HPF 0-5 0 - 5 RBC/hpf   WBC, UA 0-5 0 - 5 WBC/hpf   Bacteria, UA RARE (A) NONE SEEN   Squamous Epithelial / HPF 11-20 0 - 5 /HPF   Mucus PRESENT   POC occult blood, ED     Status: Abnormal   Collection Time: 05/19/24  3:18 PM  Result Value Ref Range   Fecal Occult Bld POSITIVE (A) NEGATIVE  CBC with Differential     Status: Abnormal   Collection Time: 05/19/24  5:25 PM  Result Value Ref Range  WBC 11.2 (H) 4.0 - 10.5 K/uL   RBC 3.58 (L) 3.87 - 5.11 MIL/uL   Hemoglobin 10.0 (L) 12.0 - 15.0 g/dL   HCT 09.8 (L) 11.9 - 14.7 %   MCV 87.2 80.0 - 100.0 fL   MCH 27.9 26.0 - 34.0 pg   MCHC 32.1 30.0 - 36.0 g/dL   RDW 82.9 56.2 - 13.0 %   Platelets 349 150 - 400 K/uL   nRBC 0.0 0.0 - 0.2 %   Neutrophils Relative % 62 %   Neutro Abs 7.0 1.7 - 7.7 K/uL   Lymphocytes Relative 16 %   Lymphs Abs 1.8 0.7 - 4.0 K/uL   Monocytes Relative 14 %   Monocytes Absolute 1.6 (H) 0.1 - 1.0 K/uL   Eosinophils Relative 6 %   Eosinophils Absolute 0.6 (H) 0.0 - 0.5 K/uL   Basophils Relative 1 %   Basophils Absolute 0.1 0.0 - 0.1 K/uL   Immature Granulocytes 1 %   Abs Immature  Granulocytes 0.10 (H) 0.00 - 0.07 K/uL  Gastrointestinal Panel by PCR , Stool     Status: None   Collection Time: 05/19/24 10:57 PM   Specimen: Stool  Result Value Ref Range   Campylobacter species NOT DETECTED NOT DETECTED   Plesimonas shigelloides NOT DETECTED NOT DETECTED   Salmonella species NOT DETECTED NOT DETECTED   Yersinia enterocolitica NOT DETECTED NOT DETECTED   Vibrio species NOT DETECTED NOT DETECTED   Vibrio cholerae NOT DETECTED NOT DETECTED   Enteroaggregative E coli (EAEC) NOT DETECTED NOT DETECTED   Enteropathogenic E coli (EPEC) NOT DETECTED NOT DETECTED   Enterotoxigenic E coli (ETEC) NOT DETECTED NOT DETECTED   Shiga like toxin producing E coli (STEC) NOT DETECTED NOT DETECTED   Shigella/Enteroinvasive E coli (EIEC) NOT DETECTED NOT DETECTED   Cryptosporidium NOT DETECTED NOT DETECTED   Cyclospora cayetanensis NOT DETECTED NOT DETECTED   Entamoeba histolytica NOT DETECTED NOT DETECTED   Giardia lamblia NOT DETECTED NOT DETECTED   Adenovirus F40/41 NOT DETECTED NOT DETECTED   Astrovirus NOT DETECTED NOT DETECTED   Norovirus GI/GII NOT DETECTED NOT DETECTED   Rotavirus A NOT DETECTED NOT DETECTED   Sapovirus (I, II, IV, and V) NOT DETECTED NOT DETECTED  Basic metabolic panel     Status: Abnormal   Collection Time: 05/20/24  6:16 AM  Result Value Ref Range   Sodium 131 (L) 135 - 145 mmol/L   Potassium 3.8 3.5 - 5.1 mmol/L   Chloride 101 98 - 111 mmol/L   CO2 22 22 - 32 mmol/L   Glucose, Bld 100 (H) 70 - 99 mg/dL   BUN 9 8 - 23 mg/dL   Creatinine, Ser 8.65 0.44 - 1.00 mg/dL   Calcium 8.2 (L) 8.9 - 10.3 mg/dL   GFR, Estimated >78 >46 mL/min   Anion gap 8 5 - 15  CBC     Status: Abnormal   Collection Time: 05/20/24  6:16 AM  Result Value Ref Range   WBC 10.6 (H) 4.0 - 10.5 K/uL   RBC 3.61 (L) 3.87 - 5.11 MIL/uL   Hemoglobin 10.1 (L) 12.0 - 15.0 g/dL   HCT 96.2 (L) 95.2 - 84.1 %   MCV 85.6 80.0 - 100.0 fL   MCH 28.0 26.0 - 34.0 pg   MCHC 32.7 30.0 - 36.0  g/dL   RDW 32.4 40.1 - 02.7 %   Platelets 370 150 - 400 K/uL   nRBC 0.0 0.0 - 0.2 %  Iron and TIBC  Status: Abnormal   Collection Time: 05/20/24  6:16 AM  Result Value Ref Range   Iron 16 (L) 28 - 170 ug/dL   TIBC 914 782 - 956 ug/dL   Saturation Ratios 5 (L) 10.4 - 31.8 %   UIBC 307 ug/dL  Ferritin     Status: None   Collection Time: 05/20/24  6:16 AM  Result Value Ref Range   Ferritin 22 11 - 307 ng/mL     CT ANGIO GI BLEED Result Date: 05/19/2024 CLINICAL DATA:  Lower GI bleed. EXAM: CTA ABDOMEN AND PELVIS WITHOUT AND WITH CONTRAST TECHNIQUE: Multidetector CT imaging of the abdomen and pelvis was performed using the standard protocol during bolus administration of intravenous contrast. Multiplanar reconstructed images and MIPs were obtained and reviewed to evaluate the vascular anatomy. RADIATION DOSE REDUCTION: This exam was performed according to the departmental dose-optimization program which includes automated exposure control, adjustment of the mA and/or kV according to patient size and/or use of iterative reconstruction technique. CONTRAST:  70mL OMNIPAQUE  IOHEXOL  350 MG/ML SOLN COMPARISON:  CT abdomen pelvis dated 08/02/2021. FINDINGS: VASCULAR Aorta: Mild atherosclerotic calcification. No aneurysmal dilatation or dissection. No periaortic fluid collection. Celiac: Patent without evidence of aneurysm, dissection, vasculitis or significant stenosis. SMA: Patent without evidence of aneurysm, dissection, vasculitis or significant stenosis. Renals: Both renal arteries are patent without evidence of aneurysm, dissection, vasculitis, fibromuscular dysplasia or significant stenosis. IMA: Patent without evidence of aneurysm, dissection, vasculitis or significant stenosis. Inflow: Patent without evidence of aneurysm, dissection, vasculitis or significant stenosis. Proximal Outflow: The visualized proximal heart post patent. Veins: The IVC is unremarkable. The SMV, splenic vein, and main portal  vein are patent. No portal venous gas. Review of the MIP images confirms the above findings. NON-VASCULAR Lower chest: Bibasilar linear atelectasis/scarring. No intra-abdominal free air or free fluid. Hepatobiliary: No focal liver abnormality is seen. No gallstones, gallbladder wall thickening, or biliary dilatation. Pancreas: There is atrophy of the body and tail of the pancreas with mild dilatation of the main pancreatic duct of indeterminate etiology, possibly sequela chronic pancreatitis. No discrete mass noted. Spleen: Normal in size without focal abnormality. Adrenals/Urinary Tract: The adrenal glands unremarkable. There is no hydronephrosis or nephrolithiasis on either side. There is symmetric enhancement of the kidneys. The visualized ureters and urinary bladder appear unremarkable. There is herniation of the bladder through the pelvic floor consistent with a cystocele. Stomach/Bowel: There is diffuse thickening of the wall of the rectosigmoid with mucosal enhancement. Additionally there is inflammatory changes and thickening of the cecum and ascending colon with mucosal enhancement. Findings consistent with colitis. Follow-up with colonoscopy after resolution of active inflammation recommended to exclude underlying mass. There is large amount of dense stool throughout the colon. There is no bowel obstruction. No evidence of active GI bleed. The appendix is not visualized with certainty. No inflammatory changes identified in the right lower quadrant. Lymphatic: No adenopathy. Reproductive: Hysterectomy. Other: None Musculoskeletal: Degenerative changes of the spine. No acute osseous pathology. IMPRESSION: 1. No evidence of active GI bleed. 2. Colitis of the cecum, ascending colon, and rectosigmoid. Follow-up with colonoscopy after resolution of active inflammation recommended to exclude underlying mass. 3. Constipation. No bowel obstruction. 4. Cystocele. Electronically Signed   By: Angus Bark M.D.    On: 05/19/2024 18:41    ROS:  As stated above in the HPI otherwise negative.  Blood pressure 101/74, pulse 85, temperature 97.8 F (36.6 C), temperature source Oral, resp. rate 17, height 5\' 2"  (1.575 m), weight 58.1 kg,  SpO2 100%.    PE: Gen: NAD, Alert and Oriented HEENT:  [place]/AT, EOMI Neck: Supple, no LAD Lungs: CTA Bilaterally CV: RRR without M/G/R ABD: Soft, mild lower abdominal tenderness, +BS Ext: No C/C/E  Assessment/Plan: 1) Pan-colitis with persistent diarrhea. 2) Diverticula. 3) Anemia with iron deficiency.   The patient continues to have diarrhea.  In light of the diarrhea and the colitis, an FFS with cold biopsies is in order.  Hopefully the FFS can provide more insight to her colitis.  As for her IDA, it is unknown if her HGB acutely dropped with the onset of her symptoms or if was a gradual drop.  There was no overt imaging evidence of any upper GI tract abnormalities.  Her infectious diarrhea work up was negative.  She may have a late onset presentation for IBD.  Plan: 1) FFS with cold biopsies tomorrow.  Dr. Dominic Friendly will perform the procedure. 2) Monitor HGB and transfuse if necessary. 3) Continue supportive care.  Keilin Gamboa D 05/20/2024, 12:36 PM

## 2024-05-20 NOTE — Progress Notes (Signed)
 HD#0 Subjective:   Summary: Allison Finley is a 73 y.o. female with PMH of diverticular disease, GERD, hypothyroidism, hyperlipidemia, and herpes zoster who presented to the ED for bloody diarrhea admitted for possible LGIB.    Overnight Events: None   Interim history: Patient is evaluated bedside, denies any nausea, vomiting.  Reports mild lower abdominal tenderness, no chest pain or shortness of breath no recent fevers.  No dizziness or lightheadedness.  Reports that she had 2 bowel movements this morning and noticed streaks of blood.  Objective:  Vital signs in last 24 hours: Vitals:   05/19/24 2000 05/19/24 2025 05/20/24 0532 05/20/24 0700  BP: (!) 111/57 127/66 117/67 101/74  Pulse: 99 (!) 101 91 85  Resp: (!) 28 19 16 17   Temp:  97.7 F (36.5 C) 97.8 F (36.6 C) 97.8 F (36.6 C)  TempSrc:  Oral Oral Oral  SpO2: 99% 100% 95% 100%  Weight:      Height:       Supplemental O2: Room Air SpO2: 100 %   Physical Exam:  Constitutional: Well-appearing, laying in bed, no acute distress Cardiovascular: regular rate and rhythm, no m/r/g Pulmonary/Chest: normal work of breathing on room air, lungs clear to auscultation bilaterally Abdominal: soft, non-tender, non-distended, bowel sounds present MSK: normal bulk and tone, no lower extremity edema Neurological: Awake, alert, answering questions appropriately, no focal deficits Skin: warm and dry Psych: Pleasant  Filed Weights   05/19/24 1026  Weight: 58.1 kg     Intake/Output Summary (Last 24 hours) at 05/20/2024 1503 Last data filed at 05/20/2024 1300 Gross per 24 hour  Intake 1200 ml  Output --  Net 1200 ml   Net IO Since Admission: 1,200 mL [05/20/24 1503]  Pertinent Labs:    Latest Ref Rng & Units 05/20/2024    6:16 AM 05/19/2024    5:25 PM 05/19/2024   10:34 AM  CBC  WBC 4.0 - 10.5 K/uL 10.6  11.2  12.5   Hemoglobin 12.0 - 15.0 g/dL 16.1  09.6  04.5   Hematocrit 36.0 - 46.0 % 30.9  31.2  33.8    Platelets 150 - 400 K/uL 370  349  397        Latest Ref Rng & Units 05/20/2024    6:16 AM 05/19/2024   10:34 AM 08/02/2021   10:01 PM  CMP  Glucose 70 - 99 mg/dL 409  811  914   BUN 8 - 23 mg/dL 9  11  12    Creatinine 0.44 - 1.00 mg/dL 7.82  9.56  2.13   Sodium 135 - 145 mmol/L 131  133  136   Potassium 3.5 - 5.1 mmol/L 3.8  4.2  3.4   Chloride 98 - 111 mmol/L 101  99  96   CO2 22 - 32 mmol/L 22  25  28    Calcium 8.9 - 10.3 mg/dL 8.2  8.8  9.4   Total Protein 6.5 - 8.1 g/dL  6.2  6.8   Total Bilirubin 0.0 - 1.2 mg/dL  0.5  0.5   Alkaline Phos 38 - 126 U/L  59  58   AST 15 - 41 U/L  18  17   ALT 0 - 44 U/L  19  18     Imaging: CT ANGIO GI BLEED Result Date: 05/19/2024 CLINICAL DATA:  Lower GI bleed. EXAM: CTA ABDOMEN AND PELVIS WITHOUT AND WITH CONTRAST TECHNIQUE: Multidetector CT imaging of the abdomen and pelvis was performed using the  standard protocol during bolus administration of intravenous contrast. Multiplanar reconstructed images and MIPs were obtained and reviewed to evaluate the vascular anatomy. RADIATION DOSE REDUCTION: This exam was performed according to the departmental dose-optimization program which includes automated exposure control, adjustment of the mA and/or kV according to patient size and/or use of iterative reconstruction technique. CONTRAST:  70mL OMNIPAQUE  IOHEXOL  350 MG/ML SOLN COMPARISON:  CT abdomen pelvis dated 08/02/2021. FINDINGS: VASCULAR Aorta: Mild atherosclerotic calcification. No aneurysmal dilatation or dissection. No periaortic fluid collection. Celiac: Patent without evidence of aneurysm, dissection, vasculitis or significant stenosis. SMA: Patent without evidence of aneurysm, dissection, vasculitis or significant stenosis. Renals: Both renal arteries are patent without evidence of aneurysm, dissection, vasculitis, fibromuscular dysplasia or significant stenosis. IMA: Patent without evidence of aneurysm, dissection, vasculitis or significant  stenosis. Inflow: Patent without evidence of aneurysm, dissection, vasculitis or significant stenosis. Proximal Outflow: The visualized proximal heart post patent. Veins: The IVC is unremarkable. The SMV, splenic vein, and main portal vein are patent. No portal venous gas. Review of the MIP images confirms the above findings. NON-VASCULAR Lower chest: Bibasilar linear atelectasis/scarring. No intra-abdominal free air or free fluid. Hepatobiliary: No focal liver abnormality is seen. No gallstones, gallbladder wall thickening, or biliary dilatation. Pancreas: There is atrophy of the body and tail of the pancreas with mild dilatation of the main pancreatic duct of indeterminate etiology, possibly sequela chronic pancreatitis. No discrete mass noted. Spleen: Normal in size without focal abnormality. Adrenals/Urinary Tract: The adrenal glands unremarkable. There is no hydronephrosis or nephrolithiasis on either side. There is symmetric enhancement of the kidneys. The visualized ureters and urinary bladder appear unremarkable. There is herniation of the bladder through the pelvic floor consistent with a cystocele. Stomach/Bowel: There is diffuse thickening of the wall of the rectosigmoid with mucosal enhancement. Additionally there is inflammatory changes and thickening of the cecum and ascending colon with mucosal enhancement. Findings consistent with colitis. Follow-up with colonoscopy after resolution of active inflammation recommended to exclude underlying mass. There is large amount of dense stool throughout the colon. There is no bowel obstruction. No evidence of active GI bleed. The appendix is not visualized with certainty. No inflammatory changes identified in the right lower quadrant. Lymphatic: No adenopathy. Reproductive: Hysterectomy. Other: None Musculoskeletal: Degenerative changes of the spine. No acute osseous pathology. IMPRESSION: 1. No evidence of active GI bleed. 2. Colitis of the cecum, ascending  colon, and rectosigmoid. Follow-up with colonoscopy after resolution of active inflammation recommended to exclude underlying mass. 3. Constipation. No bowel obstruction. 4. Cystocele. Electronically Signed   By: Angus Bark M.D.   On: 05/19/2024 18:41    Assessment/Plan:   Principal Problem:   Hematochezia Active Problems:   Colitis   Patient Summary: Allison Finley. Daniely is a 37 F with a history of diverticular disease, GERD, hypothyroidism, hyperlipidemia, herpes zoster who presented to the ED for bloody diarrhea and was admitted to the [facility] on 5/29 for further workup and management of hematochezia.    Hematochezia Infectious colitis resulting dysentery  Leukocytosis IDA  Presented with 2 to 3 weeks of loose stools with hematochezia. FOBT positive. CT angio showed colitis of the cecum, ascending colon and rectosigmoid; constipation. No vascular abnormalities noted.  Less likely ischemic colitis. Last colonoscopy 12/2020 reassuring. On exam, asymptomatic. VSS, afebrile.  - Hgb 10.1 (10.0) no change - Leukocytosis 10.6 (11.2) improved - Iron 16, Saturation 5, Ferritin 22; Total Iron deficit 765   - Recommend OP IV iron  - Follow GI recs  -  FFS tomorrow  - Encourage PO intake and hydration  - Continue PPI 20 mg daily - Continue hydrocortisone 1% to be applied to hemorrhoids up to 3 times daily as needed  Pancreas CT abdomen/pelvis 2022 for nausea/vomiting did not show pancreatic ductal dilatation or surrounding inflammatory changes. CT here noted atrophy of the body and tail of the pancreas with mild dilation of the main pancreatic duct. No discrete mass noted.Changes possibly related to chronic pancreatitis. Today patient denies epigastric pain, Alk phos WNL. Recommend outpatient monitoring should patient become symptomatic.   Proteinuria Present in UA. Asymptomatic. Recommend following up with PCP.     Hypothyroidism TSH of 5.120 WNL on 03/04/2024 ([facility]). On  levothyroxine 112 mcg daily at home. Resumed here.    Hyperlipidemia On rosuvastatin 5 mg daily at bedtime at home. Resumed here.    Diet: Full liquids diet IVF: None,None VTE: SCDs Wounds: None Code: Full PT/OT recs: None TOC recs: None Family Update: Daughter and grandson at bedside   Dispo: Anticipated discharge to Home in 1 days pending medical management.   Signature: Lanney Pitts, D.O.  Internal Medicine Resident, PGY-1 [facility]  3:03 PM, 05/20/2024   Please contact the on call pager after 5 pm and on weekends at [phone].

## 2024-05-20 NOTE — Care Management Obs Status (Signed)
 MEDICARE OBSERVATION STATUS NOTIFICATION   Patient Details  Name: Allison Finley MRN: [id number] Date of Birth: [birth year]   Medicare Observation Status Notification Given:  Yes    Tom-Johnson, Angelique Ken, RN 05/20/2024, 3:09 PM

## 2024-05-20 NOTE — Consult Note (Signed)
 Reason for Consult: Colitis and diarrhea Referring Physician: Teaching Service  [facility] HPI: This is a 73 year old female with a PMH of GERD, hyperlipidemia, and hypothyroidism admitted for complaints of bloody diarrhea.  Her symptoms started a two weeks ago and she averaged multiple loose bowel movements a day that progressively turned into watery diarrhea.  She had diarrhea with or without PO intake, but PO intake certainly accelerated her diarrhea.  The patient self-medicated with Peptobismol and it turned her stools black, however, she did report having some black stools before using Pepto.  She does not report any issues with nausea, vomiting, or fever, but there is some lower abdominal "sensitivity".  As her symptoms progressed she started to notice some hematochezia and this was one of the reasons for her presentation to the hospital.  She was also feeling weak when she was at work.  In the ER a CT agio was performed and she was identified to have a pan-colitis.  Her CBC showed that her HGB was 10.1 g/dL, but compared to her CBC on 12/2023 there was a marked decline.  Her HGB on 12/2023 was at 14 g/dL.  The patient's last colonoscopy was on 01/03/2021 and it was performed for a positive Cologuard.  Diverticula were the only finding.  She had a good prep and the pull out time was 15 minutes.  The patient denies taking any new supplement or being on any new medications.  Since being hospitalized she continues to experience diarrhea.  History reviewed. No pertinent past medical history.  History reviewed. No pertinent surgical history.  History reviewed. No pertinent family history.  Social History:  reports that she has never smoked. She has never used smokeless tobacco. She reports that she does not drink alcohol and does not use drugs.  Allergies:  Allergies  Allergen Reactions   Beef-Derived Drug Products    Chicken Allergy    Pork-Derived Products Other (See Comments)     vegetarian    Medications: Scheduled:  hydrocortisone cream   Topical TID   levothyroxine  112 mcg Oral Q0600   pantoprazole  20 mg Oral Daily   rosuvastatin  5 mg Oral Daily   Continuous:  Results for orders placed or performed during the hospital encounter of 05/19/24 (from the past 24 hours)  Urinalysis, Routine w reflex microscopic -Urine, Clean Catch     Status: Abnormal   Collection Time: 05/19/24  1:54 PM  Result Value Ref Range   Color, Urine AMBER (A) YELLOW   APPearance CLOUDY (A) CLEAR   Specific Gravity, Urine 1.027 1.005 - 1.030   pH 5.0 5.0 - 8.0   Glucose, UA NEGATIVE NEGATIVE mg/dL   Hgb urine dipstick SMALL (A) NEGATIVE   Bilirubin Urine NEGATIVE NEGATIVE   Ketones, ur 5 (A) NEGATIVE mg/dL   Protein, ur 30 (A) NEGATIVE mg/dL   Nitrite NEGATIVE NEGATIVE   Leukocytes,Ua TRACE (A) NEGATIVE   RBC / HPF 0-5 0 - 5 RBC/hpf   WBC, UA 0-5 0 - 5 WBC/hpf   Bacteria, UA RARE (A) NONE SEEN   Squamous Epithelial / HPF 11-20 0 - 5 /HPF   Mucus PRESENT   POC occult blood, ED     Status: Abnormal   Collection Time: 05/19/24  3:18 PM  Result Value Ref Range   Fecal Occult Bld POSITIVE (A) NEGATIVE  CBC with Differential     Status: Abnormal   Collection Time: 05/19/24  5:25 PM  Result Value Ref Range  WBC 11.2 (H) 4.0 - 10.5 K/uL   RBC 3.58 (L) 3.87 - 5.11 MIL/uL   Hemoglobin 10.0 (L) 12.0 - 15.0 g/dL   HCT 09.8 (L) 11.9 - 14.7 %   MCV 87.2 80.0 - 100.0 fL   MCH 27.9 26.0 - 34.0 pg   MCHC 32.1 30.0 - 36.0 g/dL   RDW 82.9 56.2 - 13.0 %   Platelets 349 150 - 400 K/uL   nRBC 0.0 0.0 - 0.2 %   Neutrophils Relative % 62 %   Neutro Abs 7.0 1.7 - 7.7 K/uL   Lymphocytes Relative 16 %   Lymphs Abs 1.8 0.7 - 4.0 K/uL   Monocytes Relative 14 %   Monocytes Absolute 1.6 (H) 0.1 - 1.0 K/uL   Eosinophils Relative 6 %   Eosinophils Absolute 0.6 (H) 0.0 - 0.5 K/uL   Basophils Relative 1 %   Basophils Absolute 0.1 0.0 - 0.1 K/uL   Immature Granulocytes 1 %   Abs Immature  Granulocytes 0.10 (H) 0.00 - 0.07 K/uL  Gastrointestinal Panel by PCR , Stool     Status: None   Collection Time: 05/19/24 10:57 PM   Specimen: Stool  Result Value Ref Range   Campylobacter species NOT DETECTED NOT DETECTED   Plesimonas shigelloides NOT DETECTED NOT DETECTED   Salmonella species NOT DETECTED NOT DETECTED   Yersinia enterocolitica NOT DETECTED NOT DETECTED   Vibrio species NOT DETECTED NOT DETECTED   Vibrio cholerae NOT DETECTED NOT DETECTED   Enteroaggregative E coli (EAEC) NOT DETECTED NOT DETECTED   Enteropathogenic E coli (EPEC) NOT DETECTED NOT DETECTED   Enterotoxigenic E coli (ETEC) NOT DETECTED NOT DETECTED   Shiga like toxin producing E coli (STEC) NOT DETECTED NOT DETECTED   Shigella/Enteroinvasive E coli (EIEC) NOT DETECTED NOT DETECTED   Cryptosporidium NOT DETECTED NOT DETECTED   Cyclospora cayetanensis NOT DETECTED NOT DETECTED   Entamoeba histolytica NOT DETECTED NOT DETECTED   Giardia lamblia NOT DETECTED NOT DETECTED   Adenovirus F40/41 NOT DETECTED NOT DETECTED   Astrovirus NOT DETECTED NOT DETECTED   Norovirus GI/GII NOT DETECTED NOT DETECTED   Rotavirus A NOT DETECTED NOT DETECTED   Sapovirus (I, II, IV, and V) NOT DETECTED NOT DETECTED  Basic metabolic panel     Status: Abnormal   Collection Time: 05/20/24  6:16 AM  Result Value Ref Range   Sodium 131 (L) 135 - 145 mmol/L   Potassium 3.8 3.5 - 5.1 mmol/L   Chloride 101 98 - 111 mmol/L   CO2 22 22 - 32 mmol/L   Glucose, Bld 100 (H) 70 - 99 mg/dL   BUN 9 8 - 23 mg/dL   Creatinine, Ser 8.65 0.44 - 1.00 mg/dL   Calcium 8.2 (L) 8.9 - 10.3 mg/dL   GFR, Estimated >78 >46 mL/min   Anion gap 8 5 - 15  CBC     Status: Abnormal   Collection Time: 05/20/24  6:16 AM  Result Value Ref Range   WBC 10.6 (H) 4.0 - 10.5 K/uL   RBC 3.61 (L) 3.87 - 5.11 MIL/uL   Hemoglobin 10.1 (L) 12.0 - 15.0 g/dL   HCT 96.2 (L) 95.2 - 84.1 %   MCV 85.6 80.0 - 100.0 fL   MCH 28.0 26.0 - 34.0 pg   MCHC 32.7 30.0 - 36.0  g/dL   RDW 32.4 40.1 - 02.7 %   Platelets 370 150 - 400 K/uL   nRBC 0.0 0.0 - 0.2 %  Iron and TIBC  Status: Abnormal   Collection Time: 05/20/24  6:16 AM  Result Value Ref Range   Iron 16 (L) 28 - 170 ug/dL   TIBC 914 782 - 956 ug/dL   Saturation Ratios 5 (L) 10.4 - 31.8 %   UIBC 307 ug/dL  Ferritin     Status: None   Collection Time: 05/20/24  6:16 AM  Result Value Ref Range   Ferritin 22 11 - 307 ng/mL     CT ANGIO GI BLEED Result Date: 05/19/2024 CLINICAL DATA:  Lower GI bleed. EXAM: CTA ABDOMEN AND PELVIS WITHOUT AND WITH CONTRAST TECHNIQUE: Multidetector CT imaging of the abdomen and pelvis was performed using the standard protocol during bolus administration of intravenous contrast. Multiplanar reconstructed images and MIPs were obtained and reviewed to evaluate the vascular anatomy. RADIATION DOSE REDUCTION: This exam was performed according to the departmental dose-optimization program which includes automated exposure control, adjustment of the mA and/or kV according to patient size and/or use of iterative reconstruction technique. CONTRAST:  70mL OMNIPAQUE  IOHEXOL  350 MG/ML SOLN COMPARISON:  CT abdomen pelvis dated 08/02/2021. FINDINGS: VASCULAR Aorta: Mild atherosclerotic calcification. No aneurysmal dilatation or dissection. No periaortic fluid collection. Celiac: Patent without evidence of aneurysm, dissection, vasculitis or significant stenosis. SMA: Patent without evidence of aneurysm, dissection, vasculitis or significant stenosis. Renals: Both renal arteries are patent without evidence of aneurysm, dissection, vasculitis, fibromuscular dysplasia or significant stenosis. IMA: Patent without evidence of aneurysm, dissection, vasculitis or significant stenosis. Inflow: Patent without evidence of aneurysm, dissection, vasculitis or significant stenosis. Proximal Outflow: The visualized proximal heart post patent. Veins: The IVC is unremarkable. The SMV, splenic vein, and main portal  vein are patent. No portal venous gas. Review of the MIP images confirms the above findings. NON-VASCULAR Lower chest: Bibasilar linear atelectasis/scarring. No intra-abdominal free air or free fluid. Hepatobiliary: No focal liver abnormality is seen. No gallstones, gallbladder wall thickening, or biliary dilatation. Pancreas: There is atrophy of the body and tail of the pancreas with mild dilatation of the main pancreatic duct of indeterminate etiology, possibly sequela chronic pancreatitis. No discrete mass noted. Spleen: Normal in size without focal abnormality. Adrenals/Urinary Tract: The adrenal glands unremarkable. There is no hydronephrosis or nephrolithiasis on either side. There is symmetric enhancement of the kidneys. The visualized ureters and urinary bladder appear unremarkable. There is herniation of the bladder through the pelvic floor consistent with a cystocele. Stomach/Bowel: There is diffuse thickening of the wall of the rectosigmoid with mucosal enhancement. Additionally there is inflammatory changes and thickening of the cecum and ascending colon with mucosal enhancement. Findings consistent with colitis. Follow-up with colonoscopy after resolution of active inflammation recommended to exclude underlying mass. There is large amount of dense stool throughout the colon. There is no bowel obstruction. No evidence of active GI bleed. The appendix is not visualized with certainty. No inflammatory changes identified in the right lower quadrant. Lymphatic: No adenopathy. Reproductive: Hysterectomy. Other: None Musculoskeletal: Degenerative changes of the spine. No acute osseous pathology. IMPRESSION: 1. No evidence of active GI bleed. 2. Colitis of the cecum, ascending colon, and rectosigmoid. Follow-up with colonoscopy after resolution of active inflammation recommended to exclude underlying mass. 3. Constipation. No bowel obstruction. 4. Cystocele. Electronically Signed   By: Angus Bark M.D.    On: 05/19/2024 18:41    ROS:  As stated above in the HPI otherwise negative.  Blood pressure 101/74, pulse 85, temperature 97.8 F (36.6 C), temperature source Oral, resp. rate 17, height 5\' 2"  (1.575 m), weight 58.1 kg,  SpO2 100%.    PE: Gen: NAD, Alert and Oriented HEENT:  [place]/AT, EOMI Neck: Supple, no LAD Lungs: CTA Bilaterally CV: RRR without M/G/R ABD: Soft, mild lower abdominal tenderness, +BS Ext: No C/C/E  Assessment/Plan: 1) Pan-colitis with persistent diarrhea. 2) Diverticula. 3) Anemia with iron deficiency.   The patient continues to have diarrhea.  In light of the diarrhea and the colitis, an FFS with cold biopsies is in order.  Hopefully the FFS can provide more insight to her colitis.  As for her IDA, it is unknown if her HGB acutely dropped with the onset of her symptoms or if was a gradual drop.  There was no overt imaging evidence of any upper GI tract abnormalities.  Her infectious diarrhea work up was negative.  She may have a late onset presentation for IBD.  Plan: 1) FFS with cold biopsies tomorrow.  Dr. Dominic Friendly will perform the procedure. 2) Monitor HGB and transfuse if necessary. 3) Continue supportive care.  Keilin Gamboa D 05/20/2024, 12:36 PM

## 2024-05-21 ENCOUNTER — Encounter (HOSPITAL_COMMUNITY): Payer: Self-pay | Admitting: Internal Medicine

## 2024-05-21 ENCOUNTER — Observation Stay (HOSPITAL_COMMUNITY): Admitting: Anesthesiology

## 2024-05-21 ENCOUNTER — Encounter (HOSPITAL_COMMUNITY)
Admission: EM | Disposition: A | Discharge status: Home / Self Care | Payer: Self-pay | Source: Home / Self Care | Attending: Internal Medicine

## 2024-05-21 DIAGNOSIS — K921 Melena: Secondary | ICD-10-CM | POA: Diagnosis not present

## 2024-05-21 DIAGNOSIS — K529 Noninfective gastroenteritis and colitis, unspecified: Secondary | ICD-10-CM

## 2024-05-21 DIAGNOSIS — K6289 Other specified diseases of anus and rectum: Secondary | ICD-10-CM

## 2024-05-21 HISTORY — PX: FLEXIBLE SIGMOIDOSCOPY: SHX5431

## 2024-05-21 HISTORY — PX: BONE BIOPSY: SHX375

## 2024-05-21 LAB — CBC
HCT: 34.2 % — ABNORMAL LOW (ref 36.0–46.0)
Hemoglobin: 11.3 g/dL — ABNORMAL LOW (ref 12.0–15.0)
MCH: 28.3 pg (ref 26.0–34.0)
MCHC: 33 g/dL (ref 30.0–36.0)
MCV: 85.7 fL (ref 80.0–100.0)
Platelets: 414 10*3/uL — ABNORMAL HIGH (ref 150–400)
RBC: 3.99 MIL/uL (ref 3.87–5.11)
RDW: 12.8 % (ref 11.5–15.5)
WBC: 13.1 10*3/uL — ABNORMAL HIGH (ref 4.0–10.5)
nRBC: 0 % (ref 0.0–0.2)

## 2024-05-21 LAB — BASIC METABOLIC PANEL WITH GFR
Anion gap: 7 (ref 5–15)
BUN: 5 mg/dL — ABNORMAL LOW (ref 8–23)
CO2: 27 mmol/L (ref 22–32)
Calcium: 8.4 mg/dL — ABNORMAL LOW (ref 8.9–10.3)
Chloride: 101 mmol/L (ref 98–111)
Creatinine, Ser: 0.64 mg/dL (ref 0.44–1.00)
GFR, Estimated: >60 mL/min (ref >60)
Glucose, Bld: 111 mg/dL — ABNORMAL HIGH (ref 70–99)
Potassium: 3.9 mmol/L (ref 3.5–5.1)
Sodium: 135 mmol/L (ref 135–145)

## 2024-05-21 LAB — C DIFFICILE QUICK SCREEN W PCR REFLEX
C Diff antigen: NEGATIVE
C Diff interpretation: NOT DETECTED
C Diff toxin: NEGATIVE

## 2024-05-21 SURGERY — SIGMOIDOSCOPY, FLEXIBLE
Anesthesia: Monitor Anesthesia Care

## 2024-05-21 MED ORDER — PROPOFOL 500 MG/50ML IV EMUL
INTRAVENOUS | Status: DC | PRN
  Administered 2024-05-21: 100 ug/kg/min via INTRAVENOUS

## 2024-05-21 MED ORDER — LIDOCAINE 2% (20 MG/ML) 5 ML SYRINGE
INTRAMUSCULAR | Status: DC | PRN
  Administered 2024-05-21: 30 mg via INTRAVENOUS

## 2024-05-21 MED ORDER — SODIUM CHLORIDE 0.9 % IV SOLN
INTRAVENOUS | Status: DC | PRN
Start: 2024-05-21 — End: 2024-05-21

## 2024-05-21 NOTE — Anesthesia Procedure Notes (Signed)
 Date/Time: 05/21/2024 10:19 AM  Performed by: Dyanna Glasgow, CRNAPre-anesthesia Checklist: Patient identified, Emergency Drugs available, Suction available and Patient being monitored Patient Re-evaluated:Patient Re-evaluated prior to induction Oxygen Delivery Method: Nasal cannula Preoxygenation: Pre-oxygenation with 100% oxygen Induction Type: IV induction

## 2024-05-21 NOTE — Transfer of Care (Signed)
 Immediate Anesthesia Transfer of Care Note  Patient: Allison Finley  Procedure(s) Performed: SIGMOIDOSCOPY, FLEXIBLE BIOPSY, GI  Patient Location: PACU  Anesthesia Type:MAC  Level of Consciousness: awake, alert , and oriented  Airway & Oxygen Therapy: Patient Spontanous Breathing and Patient connected to nasal cannula oxygen  Post-op Assessment: Report given to RN and Post -op Vital signs reviewed and stable  Post vital signs: Reviewed and stable  Last Vitals:  Vitals Value Taken Time  BP 80/51 05/21/24 1043  Temp 37.3 C 05/21/24 1042  Pulse 85 05/21/24 1044  Resp 18 05/21/24 1044  SpO2 95 % 05/21/24 1044  Vitals shown include unfiled device data.  Last Pain:  Vitals:   05/21/24 0958  TempSrc: Temporal  PainSc: 2          Complications: No notable events documented.

## 2024-05-21 NOTE — Op Note (Signed)
 [facility] Patient Name: Allison Finley Procedure Date : 05/21/2024 MRN: [id number] Attending MD: Roel Clarity. Dominic Friendly , MD, [id number] Date of Birth: 06-06-1951 CSN: [id number] Age: 73 Admit Type: Inpatient Procedure:                Flexible Sigmoidoscopy Indications:              Hematochezia, Diarrhea, Abnormal CT of the GI tract                           see Dr Cleora Daft inpatient consult from yesterday.                           GI pathogen panel (without C. difficile) negative Providers:                Ace Abu L. Dominic Friendly, MD, Perla Bradford, RN, Gabino Joe, Technician Referring MD:              Medicines:                Monitored Anesthesia Care Complications:            No immediate complications. Estimated Blood Loss:     Estimated blood loss was minimal. Procedure:                Pre-Anesthesia Assessment:                           - Prior to the procedure, a History and Physical                            was performed, and patient medications and                            allergies were reviewed. The patient's tolerance of                            previous anesthesia was also reviewed. The risks                            and benefits of the procedure and the sedation                            options and risks were discussed with the patient.                            All questions were answered, and informed consent                            was obtained. Prior Anticoagulants: The patient has                            taken no anticoagulant or antiplatelet agents. ASA  Grade Assessment: III - A patient with severe                            systemic disease. After reviewing the risks and                            benefits, the patient was deemed in satisfactory                            condition to undergo the procedure.                           After obtaining informed consent, the scope was                             passed under direct vision. The CF-HQ190L ([id number])                            Olympus coloscope was introduced through the anus                            and advanced to the the sigmoid colon. The flexible                            sigmoidoscopy was accomplished without difficulty.                            The patient tolerated the procedure well. The                            quality of the bowel preparation was good. Scope In: 10:28:11 AM Scope Out: 10:32:48 AM Total Procedure Duration: 0 hours 4 minutes 37 seconds  Findings:      The perianal and digital rectal examinations were normal aside from       incidental uterine prolapse (see photo).      Diffuse severe inflammation characterized by increased vascularity,       friability, edema and mucus was found in the rectum and in the distal       sigmoid colon. Biopsies were taken with a cold forceps for histology       (evaluate for possible IBD).      The exam was otherwise without abnormality. Retroflexion not performed       in the rectum due to the degree of inflammation. Impression:               - Diffuse severe inflammation was found in the                            rectum and in the distal sigmoid colon secondary to                            colitis. Biopsied.                           - The examination was otherwise normal. Recommendation:           -  Return patient to hospital ward for ongoing care.                           - Resume regular diet.                           - Check stool for Clostridium difficile PCR today                            (will be ordered). Must rule this out before                            starting steroids for possible new onset IBD.                           Patient and daughter will be updated regarding                            findings and plan. Procedure Code(s):        --- Professional ---                           [phone], Sigmoidoscopy, flexible; with biopsy,  single                            or multiple Diagnosis Code(s):        --- Professional ---                           K52.9, Noninfective gastroenteritis and colitis,                            unspecified                           K92.1, Melena (includes Hematochezia)                           R19.7, Diarrhea, unspecified                           R93.3, Abnormal findings on diagnostic imaging of                            other parts of digestive tract CPT copyright 2022 [employer]. All rights reserved. The codes documented in this report are preliminary and upon coder review may  be revised to meet current compliance requirements. Thaddus Mcdowell L. Dominic Friendly, MD 05/21/2024 10:42:49 AM This report has been signed electronically. Number of Addenda: 0

## 2024-05-21 NOTE — Plan of Care (Signed)

## 2024-05-21 NOTE — Anesthesia Preprocedure Evaluation (Signed)
 Anesthesia Evaluation  Patient identified by MRN, date of birth, ID band Patient awake    Reviewed: Allergy & Precautions, NPO status , Patient's Chart, lab work & pertinent test results, reviewed documented beta blocker date and time   History of Anesthesia Complications Negative for: history of anesthetic complications  Airway Mallampati: I  TM Distance: >3 FB     Dental no notable dental hx.    Pulmonary neg COPD, neg PE   breath sounds clear to auscultation       Cardiovascular negative cardio ROS  Rhythm:Regular Rate:Normal     Neuro/Psych neg Seizures    GI/Hepatic ,GERD  ,,LGIB   Endo/Other    Renal/GU      Musculoskeletal   Abdominal   Peds  Hematology  (+) Blood dyscrasia, anemia   Anesthesia Other Findings   Reproductive/Obstetrics                              Anesthesia Physical Anesthesia Plan  ASA: 2  Anesthesia Plan: MAC   Post-op Pain Management:    Induction: Intravenous  PONV Risk Score and Plan:   Airway Management Planned: Natural Airway and Simple Face Mask  Additional Equipment:   Intra-op Plan:   Post-operative Plan:   Informed Consent: I have reviewed the patients History and Physical, chart, labs and discussed the procedure including the risks, benefits and alternatives for the proposed anesthesia with the patient or authorized representative who has indicated his/her understanding and acceptance.     Dental advisory given  Plan Discussed with: CRNA  Anesthesia Plan Comments:          Anesthesia Quick Evaluation

## 2024-05-21 NOTE — Interval H&P Note (Signed)
 History and Physical Interval Note:  05/21/2024 10:04 AM  Allison Finley  has presented today for surgery, with the diagnosis of Diarrhea, hematochezia, and colitis.  The various methods of treatment have been discussed with the patient and family. After consideration of risks, benefits and other options for treatment, the patient has consented to  Procedure(s): SIGMOIDOSCOPY, FLEXIBLE (N/A) as a surgical intervention.  The patient's history has been reviewed, patient examined, no change in status, stable for surgery.  I have reviewed the patient's chart and labs.  Questions were answered to the patient's satisfaction.    Signout rec'd from Dr Nickey Barn and chart review performed.  Patient clinically stable and Hgb 11.3  [name]

## 2024-05-21 NOTE — Progress Notes (Signed)
 HD#0 Subjective:   Summary: Allison Finley is a 73 y.o. female with female with PMH of diverticular disease, GERD, hypothyroidism, hyperlipidemia, and herpes zoster who presented to the ED for bloody diarrhea admitted for possible LGIB.    Overnight Events: None   Interim history: Patient is evaluated at bedside, with daughter present.  Reports that she is doing well this morning, denies any acute nausea or vomiting or abdominal pain.  No recent fevers.  Reports that she had 2 bowel movements this morning with streaks of blood.  No dizzy spells or lightheadedness.  Objective:  Vital signs in last 24 hours: Vitals:   05/21/24 1042 05/21/24 1045 05/21/24 1100 05/21/24 1115  BP: (!) 81/53 (!) 90/47 (!) 93/57 (!) 94/52  Pulse: 87 87 82 80  Resp: (!) 27 (!) 23 17 19   Temp: 99.2 F (37.3 C)   98 F (36.7 C)  TempSrc:      SpO2: 98% 98% 98% 98%  Weight:      Height:       Supplemental O2: Room Air SpO2: 98 %   Physical Exam:   Constitutional: Appeared pale, laying in bed, no acute distress Cardiovascular: regular rate  Pulmonary/Chest: normal work of breathing on room air, lungs clear to auscultation bilaterally Abdominal: Soft, nondistended. MSK: normal bulk and tone, no lower extremity edema Neurological: Awake, alert, answering questions appropriately, no focal deficits Skin: warm and dry Psych: Pleasant  Filed Weights   05/19/24 1026  Weight: 58.1 kg     Intake/Output Summary (Last 24 hours) at 05/21/2024 1528 Last data filed at 05/21/2024 1035 Gross per 24 hour  Intake 50 ml  Output --  Net 50 ml   Net IO Since Admission: 1,250 mL [05/21/24 1528]  Pertinent Labs:    Latest Ref Rng & Units 05/21/2024    5:16 AM 05/20/2024    6:16 AM 05/19/2024    5:25 PM  CBC  WBC 4.0 - 10.5 K/uL 13.1  10.6  11.2   Hemoglobin 12.0 - 15.0 g/dL 16.1  09.6  04.5   Hematocrit 36.0 - 46.0 % 34.2  30.9  31.2   Platelets 150 - 400 K/uL 414  370  349        Latest  Ref Rng & Units 05/21/2024    5:16 AM 05/20/2024    6:16 AM 05/19/2024   10:34 AM  CMP  Glucose 70 - 99 mg/dL 409  811  914   BUN 8 - 23 mg/dL 5  9  11    Creatinine 0.44 - 1.00 mg/dL 7.82  9.56  2.13   Sodium 135 - 145 mmol/L 135  131  133   Potassium 3.5 - 5.1 mmol/L 3.9  3.8  4.2   Chloride 98 - 111 mmol/L 101  101  99   CO2 22 - 32 mmol/L 27  22  25    Calcium  8.9 - 10.3 mg/dL 8.4  8.2  8.8   Total Protein 6.5 - 8.1 g/dL   6.2   Total Bilirubin 0.0 - 1.2 mg/dL   0.5   Alkaline Phos 38 - 126 U/L   59   AST 15 - 41 U/L   18   ALT 0 - 44 U/L   19     Imaging: No results found.  Assessment/Plan:   Principal Problem:   Hematochezia Active Problems:   Colitis   Patient Summary: Allison Finley. Hendricksen is a 69 F with a history of diverticular disease, GERD,  hypothyroidism, hyperlipidemia, herpes zoster who presented to the ED for bloody diarrhea and was admitted to the IMTS on 5/29 for further workup and management of hematochezia.    Hematochezia Colitis Leukocytosis IDA  Presented with 2 to 3 weeks of loose stools with hematochezia. FOBT positive. CT angio showed colitis of the cecum, ascending colon and rectosigmoid; constipation. No vascular abnormalities noted. Last colonoscopy 12/2020 reassuring.  Flexible sigmoidoscopy today showed diffuse severe inflammation characterized by increased vascularity, friability, edema and mucus in the rectum and in the distal sigmoid colon concerning for colitis. Biopsies were taken to evaluate for IBD.  - GI on board, will plan to start steroids once C. difficile panel comes back negative - Hgb 11.3 (10.9), stable - Leukocytosis 13.1 (10.6), increased - Follow-up C. difficile - Iron 16, Saturation 5, Ferritin 22; Total Iron deficit 765              - Recommend OP IV iron  - Encourage PO intake and hydration  - Continue PPI 20 mg daily - Continue hydrocortisone  1% to be applied to hemorrhoids up to 3 times daily as needed   Pancreas CT  abdomen/pelvis 2022 for nausea/vomiting did not show pancreatic ductal dilatation or surrounding inflammatory changes. CT here noted atrophy of the body and tail of the pancreas with mild dilation of the main pancreatic duct. No discrete mass noted. Changes possibly related to chronic pancreatitis. Alk phos WNL. Recommend outpatient monitoring should patient become symptomatic.   Proteinuria Present in UA. Asymptomatic. Recommend following up with PCP.      Hypothyroidism TSH of 5.120 WNL on 03/04/2024 ([facility]). On levothyroxine  112 mcg daily at home. Resumed here.    Hyperlipidemia On rosuvastatin  5 mg daily at bedtime at home. Resumed here.   Diet: Regular IVF: None,None VTE: SCDs Wounds:  Code: Full PT/OT recs: None TOC recs: None Family Update: Daughter at bedside   Dispo: Anticipated discharge to Home in 1 days pending medical management.   Signature: Lanney Pitts, D.O.  Internal Medicine Resident, PGY-1 [facility]  3:28 PM, 05/21/2024   Please contact the on call pager after 5 pm and on weekends at [phone].

## 2024-05-22 ENCOUNTER — Encounter (HOSPITAL_COMMUNITY): Payer: Self-pay | Admitting: Internal Medicine

## 2024-05-22 DIAGNOSIS — Z91018 Allergy to other foods: Secondary | ICD-10-CM | POA: Diagnosis not present

## 2024-05-22 DIAGNOSIS — R809 Proteinuria, unspecified: Secondary | ICD-10-CM | POA: Diagnosis present

## 2024-05-22 DIAGNOSIS — K8689 Other specified diseases of pancreas: Secondary | ICD-10-CM | POA: Diagnosis present

## 2024-05-22 DIAGNOSIS — Z79899 Other long term (current) drug therapy: Secondary | ICD-10-CM | POA: Diagnosis not present

## 2024-05-22 DIAGNOSIS — K644 Residual hemorrhoidal skin tags: Secondary | ICD-10-CM | POA: Diagnosis present

## 2024-05-22 DIAGNOSIS — K51911 Ulcerative colitis, unspecified with rectal bleeding: Secondary | ICD-10-CM | POA: Diagnosis present

## 2024-05-22 DIAGNOSIS — N814 Uterovaginal prolapse, unspecified: Secondary | ICD-10-CM | POA: Diagnosis present

## 2024-05-22 DIAGNOSIS — D509 Iron deficiency anemia, unspecified: Secondary | ICD-10-CM

## 2024-05-22 DIAGNOSIS — Z79890 Hormone replacement therapy (postmenopausal): Secondary | ICD-10-CM | POA: Diagnosis not present

## 2024-05-22 DIAGNOSIS — E871 Hypo-osmolality and hyponatremia: Secondary | ICD-10-CM | POA: Diagnosis present

## 2024-05-22 DIAGNOSIS — D500 Iron deficiency anemia secondary to blood loss (chronic): Secondary | ICD-10-CM | POA: Diagnosis present

## 2024-05-22 DIAGNOSIS — K219 Gastro-esophageal reflux disease without esophagitis: Secondary | ICD-10-CM | POA: Diagnosis present

## 2024-05-22 DIAGNOSIS — N8110 Cystocele, unspecified: Secondary | ICD-10-CM | POA: Diagnosis present

## 2024-05-22 DIAGNOSIS — E039 Hypothyroidism, unspecified: Secondary | ICD-10-CM | POA: Diagnosis present

## 2024-05-22 DIAGNOSIS — R609 Edema, unspecified: Secondary | ICD-10-CM | POA: Diagnosis present

## 2024-05-22 DIAGNOSIS — K529 Noninfective gastroenteritis and colitis, unspecified: Secondary | ICD-10-CM | POA: Diagnosis not present

## 2024-05-22 DIAGNOSIS — K5130 Ulcerative (chronic) rectosigmoiditis without complications: Secondary | ICD-10-CM | POA: Diagnosis not present

## 2024-05-22 DIAGNOSIS — E785 Hyperlipidemia, unspecified: Secondary | ICD-10-CM | POA: Diagnosis present

## 2024-05-22 DIAGNOSIS — Z91014 Allergy to mammalian meats: Secondary | ICD-10-CM | POA: Diagnosis not present

## 2024-05-22 DIAGNOSIS — A09 Infectious gastroenteritis and colitis, unspecified: Secondary | ICD-10-CM | POA: Diagnosis present

## 2024-05-22 DIAGNOSIS — K5730 Diverticulosis of large intestine without perforation or abscess without bleeding: Secondary | ICD-10-CM | POA: Diagnosis present

## 2024-05-22 DIAGNOSIS — K921 Melena: Secondary | ICD-10-CM | POA: Diagnosis present

## 2024-05-22 DIAGNOSIS — K5900 Constipation, unspecified: Secondary | ICD-10-CM | POA: Diagnosis present

## 2024-05-22 LAB — CBC
HCT: 29.4 % — ABNORMAL LOW (ref 36.0–46.0)
Hemoglobin: 9.7 g/dL — ABNORMAL LOW (ref 12.0–15.0)
MCH: 28.1 pg (ref 26.0–34.0)
MCHC: 33 g/dL (ref 30.0–36.0)
MCV: 85.2 fL (ref 80.0–100.0)
Platelets: 369 10*3/uL (ref 150–400)
RBC: 3.45 MIL/uL — ABNORMAL LOW (ref 3.87–5.11)
RDW: 13 % (ref 11.5–15.5)
WBC: 11 10*3/uL — ABNORMAL HIGH (ref 4.0–10.5)
nRBC: 0 % (ref 0.0–0.2)

## 2024-05-22 MED ORDER — CARMEX CLASSIC LIP BALM EX OINT
TOPICAL_OINTMENT | CUTANEOUS | Status: DC | PRN
  Filled 2024-05-22: qty 10

## 2024-05-22 MED ORDER — METHYLPREDNISOLONE SODIUM SUCC 125 MG IJ SOLR
80.0000 mg | Freq: Every day | INTRAMUSCULAR | Status: DC
  Administered 2024-05-22 – 2024-05-24 (×3): 80 mg via INTRAVENOUS
  Filled 2024-05-22 (×3): qty 2

## 2024-05-22 NOTE — Progress Notes (Addendum)
 Daily Progress Note Cross Coverage for Dr. Nickey Barn DOA: 05/19/2024 Hospital Day: 4   Cc:    Brief History:  73 y.o. year old female with a medical history including but not limited to GERD, hypothyroidism, hyperlipidemia.  Patient admitted 05/19/2024 with diarrhea.  See 05/20/24 GI consult note by Dr. Nickey Barn   ASSESSMENT    73 year old female admitted with diarrhea , pancolitis on CT scan and iron deficiency anemia.   Diffuse severe inflammation of the distal colon and rectum on flex sigmoidoscopy this admission.  Biopsies are pending .  Suspect ulcerative colitis Infectious workup negative. Today:  Symptoms improving on solumedrol. Stool forming and without blood  Iron deficiency anemia.  Hgb fluctuating between 11's and 9's. Hasn't required transfusion. Ferritin 22.    PLAN   --Started on IV Solu-Medrol 80 mg daily yesterday.  Will continue for now -- Await colon biopsies --am CBC --Would probably benefit from dose of IV iron this admission   Subjective   Tolerating small portions of food.  Stools are little more formed today and without blood.   Objective    Recent Labs    05/20/24 0616 05/21/24 0516 05/22/24 0614  WBC 10.6* 13.1* 11.0*  HGB 10.1* 11.3* 9.7*  HCT 30.9* 34.2* 29.4*  MCV 85.6 85.7 85.2  PLT 370 414* 369   Recent Labs    05/20/24 0616  FERRITIN 22  TIBC 323  IRONPCTSAT 5*   Recent Labs    05/20/24 0616 05/21/24 0516  NA 131* 135  K 3.8 3.9  CL 101 101  CO2 22 27  GLUCOSE 100* 111*  BUN 9 5*  CREATININE 0.67 0.64  CALCIUM  8.2* 8.4*   Imaging:  CT ANGIO GI BLEED CLINICAL DATA:  Lower GI bleed.  EXAM: CTA ABDOMEN AND PELVIS WITHOUT AND WITH CONTRAST  TECHNIQUE: Multidetector CT imaging of the abdomen and pelvis was performed using the standard protocol during bolus administration of intravenous contrast. Multiplanar reconstructed images and MIPs were obtained and reviewed to evaluate the vascular anatomy.  RADIATION DOSE  REDUCTION: This exam was performed according to the departmental dose-optimization program which includes automated exposure control, adjustment of the mA and/or kV according to patient size and/or use of iterative reconstruction technique.  CONTRAST:  70mL OMNIPAQUE  IOHEXOL  350 MG/ML SOLN  COMPARISON:  CT abdomen pelvis dated 08/02/2021.  FINDINGS: VASCULAR  Aorta: Mild atherosclerotic calcification. No aneurysmal dilatation or dissection. No periaortic fluid collection.  Celiac: Patent without evidence of aneurysm, dissection, vasculitis or significant stenosis.  SMA: Patent without evidence of aneurysm, dissection, vasculitis or significant stenosis.  Renals: Both renal arteries are patent without evidence of aneurysm, dissection, vasculitis, fibromuscular dysplasia or significant stenosis.  IMA: Patent without evidence of aneurysm, dissection, vasculitis or significant stenosis.  Inflow: Patent without evidence of aneurysm, dissection, vasculitis or significant stenosis.  Proximal Outflow: The visualized proximal heart post patent.  Veins: The IVC is unremarkable. The SMV, splenic vein, and main portal vein are patent. No portal venous gas.  Review of the MIP images confirms the above findings.  NON-VASCULAR  Lower chest: Bibasilar linear atelectasis/scarring.  No intra-abdominal free air or free fluid.  Hepatobiliary: No focal liver abnormality is seen. No gallstones, gallbladder wall thickening, or biliary dilatation.  Pancreas: There is atrophy of the body and tail of the pancreas with mild dilatation of the main pancreatic duct of indeterminate etiology, possibly sequela chronic pancreatitis. No discrete mass noted.  Spleen: Normal in size without focal abnormality.  Adrenals/Urinary Tract: The  adrenal glands unremarkable. There is no hydronephrosis or nephrolithiasis on either side. There is symmetric enhancement of the kidneys. The visualized ureters  and urinary bladder appear unremarkable. There is herniation of the bladder through the pelvic floor consistent with a cystocele.  Stomach/Bowel: There is diffuse thickening of the wall of the rectosigmoid with mucosal enhancement. Additionally there is inflammatory changes and thickening of the cecum and ascending colon with mucosal enhancement. Findings consistent with colitis. Follow-up with colonoscopy after resolution of active inflammation recommended to exclude underlying mass. There is large amount of dense stool throughout the colon. There is no bowel obstruction. No evidence of active GI bleed. The appendix is not visualized with certainty. No inflammatory changes identified in the right lower quadrant.  Lymphatic: No adenopathy.  Reproductive: Hysterectomy.  Other: None  Musculoskeletal: Degenerative changes of the spine. No acute osseous pathology.  IMPRESSION: 1. No evidence of active GI bleed. 2. Colitis of the cecum, ascending colon, and rectosigmoid. Follow-up with colonoscopy after resolution of active inflammation recommended to exclude underlying mass. 3. Constipation. No bowel obstruction. 4. Cystocele.  Electronically Signed   By: Angus Bark M.D.   On: 05/19/2024 18:41     Scheduled inpatient medications:   hydrocortisone  cream   Topical TID   levothyroxine   112 mcg Oral Q0600   methylPREDNISolone (SOLU-MEDROL) injection  80 mg Intravenous Daily   pantoprazole   20 mg Oral Daily   rosuvastatin   5 mg Oral Daily   Continuous inpatient infusions:  PRN inpatient medications:   Vital signs in last 24 hours: Temp:  [97.6 F (36.4 C)-98.9 F (37.2 C)] 97.6 F (36.4 C) (06/01 0854) Pulse Rate:  [74-100] 87 (06/01 0854) Resp:  [18-20] 18 (06/01 0854) BP: (101-118)/(60-68) 105/66 (06/01 0854) SpO2:  [96 %-98 %] 98 % (06/01 0854) Last BM Date : 05/21/24  Intake/Output Summary (Last 24 hours) at 05/22/2024 1148 Last data filed at 05/22/2024  0854 Gross per 24 hour  Intake 240 ml  Output --  Net 240 ml    Intake/Output from previous day: 05/31 0701 - 06/01 0700 In: 170 [P.O.:120; I.V.:50] Out: -  Intake/Output this shift: Total I/O In: 120 [P.O.:120] Out: -    Physical Exam:  General: Alert female in NAD Heart:  Regular rate and rhythm.  Pulmonary: Normal respiratory effort Abdomen: Soft, nondistended, nontender. Normal bowel sounds. Extremities: No lower extremity edema  Neurologic: Alert and oriented Psych: Pleasant. Cooperative     LOS: 0 days   Mai Schwalbe ,NP 05/22/2024, 11:48 AM  I have taken an interval history, thoroughly reviewed the chart and examined the patient. I agree with the Advanced Practitioner's note, impression and recommendations, and have recorded additional findings, impressions and recommendations below. I performed a substantive portion of this encounter (>50% time spent), including a complete performance of the medical decision making.  My additional thoughts are as follows:  Pancolitis-looks like new diagnosis UC.  C. difficile came back negative. Started Solu-Medrol 40 mg IV every 12 hours this morning She looks better today, she feels less anxious about the overall situation.  Abdominal exam reassuring  Recommend considering a dose of IV iron for this patient.  Dr. Nickey Barn to return tomorrow for further management   [name] [phone]

## 2024-05-22 NOTE — Anesthesia Postprocedure Evaluation (Signed)
 Anesthesia Post Note  Patient: Allison Finley  Procedure(s) Performed: SIGMOIDOSCOPY, FLEXIBLE BIOPSY, GI     Patient location during evaluation: PACU Anesthesia Type: MAC Level of consciousness: awake and alert Pain management: pain level controlled Vital Signs Assessment: post-procedure vital signs reviewed and stable Respiratory status: spontaneous breathing, nonlabored ventilation, respiratory function stable and patient connected to nasal cannula oxygen Cardiovascular status: blood pressure returned to baseline and stable Postop Assessment: no apparent nausea or vomiting Anesthetic complications: no   No notable events documented.  Last Vitals:  Vitals:   05/21/24 1623 05/21/24 2020  BP: 118/68 101/60  Pulse: 94 100  Resp: 18 20  Temp: 36.5 C 37.1 C  SpO2: 98% 96%    Last Pain:  Vitals:   05/21/24 2020  TempSrc: Oral  PainSc: 0-No pain                 Leslye Rast

## 2024-05-22 NOTE — Progress Notes (Signed)
 HD#0 Subjective:   Summary: Allison Finley is a 73 y.o. female with female with PMH of diverticular disease, GERD, hypothyroidism, hyperlipidemia, and herpes zoster who presented to the ED for bloody diarrhea admitted for possible LGIB.    Overnight Events: None   Interim history: Patient is evaluated at bedside. Reports doing well this morning. States stools are more formed today and no red blood streaks. Appetite is improving. No n/v or abdominal pain.    Objective:  Vital signs in last 24 hours: Vitals:   05/21/24 1623 05/21/24 2020 05/22/24 0458 05/22/24 0854  BP: 118/68 101/60 101/66 105/66  Pulse: 94 100 74 87  Resp: 18 20 18 18   Temp: 97.7 F (36.5 C) 98.8 F (37.1 C) 98.9 F (37.2 C) 97.6 F (36.4 C)  TempSrc: Oral Oral Oral Oral  SpO2: 98% 96% 96% 98%  Weight:      Height:       Supplemental O2: Room Air SpO2: 96 %   Physical Exam:  Constitutional: Appeared pale, sitting up in bed, no acute distress Cardiovascular: regular rate Pulmonary/Chest: normal work of breathing on room air Abdominal: Soft, non-tender, non-distended MSK: normal bulk and tone, no lower extremity edema Neurological: Awake, alert, answering questions appropriately, no focal deficits Psych: Pleasant  Filed Weights   05/19/24 1026  Weight: 58.1 kg     Intake/Output Summary (Last 24 hours) at 05/22/2024 0629 Last data filed at 05/21/2024 1624 Gross per 24 hour  Intake 170 ml  Output --  Net 170 ml   Net IO Since Admission: 1,370 mL [05/22/24 0629]  Pertinent Labs:    Latest Ref Rng & Units 05/21/2024    5:16 AM 05/20/2024    6:16 AM 05/19/2024    5:25 PM  CBC  WBC 4.0 - 10.5 K/uL 13.1  10.6  11.2   Hemoglobin 12.0 - 15.0 g/dL 16.1  09.6  04.5   Hematocrit 36.0 - 46.0 % 34.2  30.9  31.2   Platelets 150 - 400 K/uL 414  370  349        Latest Ref Rng & Units 05/21/2024    5:16 AM 05/20/2024    6:16 AM 05/19/2024   10:34 AM  CMP  Glucose 70 - 99 mg/dL 409  811   914   BUN 8 - 23 mg/dL 5  9  11    Creatinine 0.44 - 1.00 mg/dL 7.82  9.56  2.13   Sodium 135 - 145 mmol/L 135  131  133   Potassium 3.5 - 5.1 mmol/L 3.9  3.8  4.2   Chloride 98 - 111 mmol/L 101  101  99   CO2 22 - 32 mmol/L 27  22  25    Calcium  8.9 - 10.3 mg/dL 8.4  8.2  8.8   Total Protein 6.5 - 8.1 g/dL   6.2   Total Bilirubin 0.0 - 1.2 mg/dL   0.5   Alkaline Phos 38 - 126 U/L   59   AST 15 - 41 U/L   18   ALT 0 - 44 U/L   19     Imaging: No results found.  Assessment/Plan:   Principal Problem:   Hematochezia Active Problems:   Colitis   Patient Summary: Allison Finley is a 31 F with a history of diverticular disease, GERD, hypothyroidism, hyperlipidemia, herpes zoster who presented to the ED for bloody diarrhea and was admitted to the IMTS on 5/29 for further workup and management of hematochezia.  Hematochezia Colitis c/f new onset IBD Leukocytosis IDA  Presented with 2 to 3 weeks of loose stools with hematochezia. CT angio showed colitis of the cecum, ascending colon and rectosigmoid; constipation. No vascular abnormalities noted. Last colonoscopy 12/2020 reassuring.  Flexible sigmoidoscopy 5/31 showed diffuse severe inflammation characterized by increased vascularity, friability, edema and mucus in the rectum and in the distal sigmoid colon concerning for colitis. Biopsies were taken to evaluate for IBD. Negative C diff PCR. Leukocytosis improving, remains afebrile.  - GI on board, appreciate assistance, start Solumedrol today  - Trend CBC, Hgb stable 9.7 from 10-11  - Iron 16, Saturation 5, Ferritin 22; Total Iron deficit 765              - Recommend OP IV iron  - Encourage PO intake and hydration  - Continue PPI 20 mg daily - Continue hydrocortisone  1% to be applied to hemorrhoids up to 3 times daily as needed   Pancreatic duct dilatation  CT abdomen/pelvis 2022 for nausea/vomiting did not show pancreatic ductal dilatation or surrounding inflammatory  changes. CT here noted atrophy of the body and tail of the pancreas with mild dilation of the main pancreatic duct. No discrete mass noted. Changes possibly related to chronic pancreatitis. Alk phos WNL. Recommend outpatient monitoring should patient become symptomatic.   Proteinuria Present in UA. Asymptomatic, no urinary symptoms. Recommend outpatient follow up with PCP.    Hypothyroidism TSH of 5.120 WNL on 03/04/2024 ([facility]). Continued home levothyroxine  112 mcg daily.    Hyperlipidemia Continued home rosuvastatin  5 mg daily at bedtime.   Diet: Regular IVF: None,None VTE: SCDs Wounds: na Code: Full PT/OT recs: None TOC recs: None Family Update: Daughter updated 5/31   Dispo: Anticipated discharge to Home in 0-1 days pending medical management and GI disposition.   Signature: Jearldine Mina, DO Internal Medicine Resident PGY-2 Please contact the on-call pager after 5 pm and on weekends at [phone].

## 2024-05-23 ENCOUNTER — Other Ambulatory Visit (HOSPITAL_COMMUNITY): Payer: Self-pay | Admitting: Internal Medicine

## 2024-05-23 ENCOUNTER — Telehealth (HOSPITAL_COMMUNITY): Payer: Self-pay | Admitting: Internal Medicine

## 2024-05-23 DIAGNOSIS — K5130 Ulcerative (chronic) rectosigmoiditis without complications: Secondary | ICD-10-CM

## 2024-05-23 DIAGNOSIS — D509 Iron deficiency anemia, unspecified: Secondary | ICD-10-CM

## 2024-05-23 DIAGNOSIS — D500 Iron deficiency anemia secondary to blood loss (chronic): Secondary | ICD-10-CM

## 2024-05-23 LAB — CBC
HCT: 30.2 % — ABNORMAL LOW (ref 36.0–46.0)
Hemoglobin: 10 g/dL — ABNORMAL LOW (ref 12.0–15.0)
MCH: 28 pg (ref 26.0–34.0)
MCHC: 33.1 g/dL (ref 30.0–36.0)
MCV: 84.6 fL (ref 80.0–100.0)
Platelets: 373 10*3/uL (ref 150–400)
RBC: 3.57 MIL/uL — ABNORMAL LOW (ref 3.87–5.11)
RDW: 12.8 % (ref 11.5–15.5)
WBC: 15.3 10*3/uL — ABNORMAL HIGH (ref 4.0–10.5)
nRBC: 0 % (ref 0.0–0.2)

## 2024-05-23 NOTE — Progress Notes (Signed)
 Subjective: Patient seen and examined.  Chart reviewed.  Flexible sigmoidoscopy revealed evidence of IBD biopsies were done.  Patient seems to be improving on steroids but still having some rectal bleeding with multiple bowel movements in a day.  She is concerned about her iron deficiency anemia and wants to know when iron supplements will be started.  She has had some lower abdominal cramping before bowel movement but denies any other problems with abdominal pain, nausea or vomiting.  There are no ocular complaints.  She is she denies having any skin rashes. There is no history of aphthous ulcers or joint pains  Objective: Vital signs in last 24 hours: Temp:  [97.3 F (36.3 C)-97.9 F (36.6 C)] 97.3 F (36.3 C) (06/02 0700) Pulse Rate:  [72-82] 79 (06/02 0700) Resp:  [16-18] 17 (06/02 0700) BP: (100-110)/(62-69) 100/66 (06/02 0700) SpO2:  [97 %-100 %] 100 % (06/02 0700) Last BM Date : 05/23/24  Intake/Output from previous day: 06/01 0701 - 06/02 0700 In: 360 [P.O.:360] Out: -  Intake/Output this shift: Total I/O In: 240 [P.O.:240] Out: -   General appearance: alert, cooperative, appears stated age, no distress, and pale Resp: clear to auscultation bilaterally Cardio: regular rate and rhythm, S1, S2 normal, no murmur, click, rub or gallop GI: soft, non-tender; bowel sounds normal; no masses,  no organomegaly Extremities: extremities normal, atraumatic, no cyanosis or edema  Lab Results: Recent Labs    05/21/24 0516 05/22/24 0614 05/23/24 0658  WBC 13.1* 11.0* 15.3*  HGB 11.3* 9.7* 10.0*  HCT 34.2* 29.4* 30.2*  PLT 414* 369 373   BMET Recent Labs    05/21/24 0516  NA 135  K 3.9  CL 101  CO2 27  GLUCOSE 111*  BUN 5*  CREATININE 0.64  CALCIUM  8.4*    C-Diff Recent Labs    05/21/24 1135  CDIFFTOX NEGATIVE   No results for input(s): "CDIFFPCR" in the last 72 hours. Fecal Lactopherrin No results for input(s): "FECLLACTOFRN" in the last 72  hours.  Studies/Results: No results found.  Medications: I have reviewed the patient's current medications. Prior to Admission:  Medications Prior to Admission  Medication Sig Dispense Refill Last Dose/Taking   levothyroxine  (SYNTHROID ) 112 MCG tablet Take 112 mcg by mouth daily.   05/18/2024 Morning   Multiple Vitamin (MULTIVITAMIN WITH MINERALS) TABS tablet Take 1 tablet by mouth daily.   Past Week   omeprazole  (PRILOSEC) 20 MG capsule Take 1 capsule (20 mg total) by mouth daily. 30 capsule 1 05/19/2024 Morning   rosuvastatin  (CRESTOR ) 5 MG tablet Take 5 mg by mouth daily.   05/19/2024 Evening   Scheduled:  hydrocortisone  cream   Topical TID   levothyroxine   112 mcg Oral Q0600   methylPREDNISolone (SOLU-MEDROL) injection  80 mg Intravenous Daily   pantoprazole   20 mg Oral Daily   rosuvastatin   5 mg Oral Daily   Continuous: PRN:lip balm  Assessment/Plan: 1) Bloody diarrhea with evidence of possible inflammation noted in the rectum and rectosigmoid colon on flexible sigmoidoscopy and inflammation noted in the CTA with changes in the cecum and ascending colon, transverse colon and descending colon including sigmoid colon and rectum. Biopsy results are still pending patient seems to be responding to steroids well. I told her that we will discharge her on a steroid taper with plans for to follow-up in the office in 2 weeks. I would not recommend starting oral iron at this time because of the confusion with black stools but she may benefit from an iron  infusion prior to discharge. 2) Colonic diverticulosis. 3) Posthemorrhagic iron deficiency anemia. 4) GERD on PPI's. 5) Hypothyroidism-on Synthroid .Aaron Aas 6) Hyperlipidemia-on Rosuvastatin ..  LOS: 1 day   Tami Falcon 05/23/2024, 12:51 PM

## 2024-05-23 NOTE — Telephone Encounter (Signed)
 Patient referred to infusion pharmacy team for ambulatory infusion of IV iron.  Insurance - [employer]  Dx code - D50.9 IV Iron Therapy - Venofer 200 mg IV x 5  Infusion appointments - [facility] Scheduling team will schedule patient as soon as possible.   Allison Finley D. Chino Sardo, PharmD

## 2024-05-23 NOTE — Plan of Care (Signed)

## 2024-05-23 NOTE — Progress Notes (Signed)
 HD#1 Subjective:   Summary: Allison Finley is a 72 y.o. female with female with PMH of diverticular disease, GERD, hypothyroidism, hyperlipidemia, and herpes zoster who presented to the ED for bloody diarrhea admitted for Hematochezia   Overnight Events: None   Interim history: Patient is evaluated at bedside. Reports doing well this morning. States stools are more formed today and some red blood streaks.  Appetite is well, no nausea or vomiting.  No abdominal pain.  No dizziness or lightheadedness.  Objective:  Vital signs in last 24 hours: Vitals:   05/22/24 1619 05/22/24 1939 05/23/24 0500 05/23/24 0700  BP: 110/69 105/67 102/62 100/66  Pulse: 73 82 72 79  Resp: 18 17 16 17   Temp: (!) 97.5 F (36.4 C) 97.6 F (36.4 C) 97.9 F (36.6 C) (!) 97.3 F (36.3 C)  TempSrc: Oral Oral Oral Oral  SpO2: 97% 98% 100% 100%  Weight:      Height:       Supplemental O2: Room Air SpO2: 100 %   Physical Exam:  Constitutional: Appeared pale, sitting up in bed, no acute distress Cardiovascular: regular rate Pulmonary/Chest: normal work of breathing on room air Abdominal: Soft, non-tender, non-distended MSK: normal bulk and tone, no lower extremity edema Neurological: Awake, alert, answering questions appropriately, no focal deficits Psych: Pleasant  Filed Weights   05/19/24 1026  Weight: 58.1 kg     Intake/Output Summary (Last 24 hours) at 05/23/2024 1408 Last data filed at 05/23/2024 1300 Gross per 24 hour  Intake 720 ml  Output --  Net 720 ml   Net IO Since Admission: 2,210 mL [05/23/24 1408]  Pertinent Labs:    Latest Ref Rng & Units 05/23/2024    6:58 AM 05/22/2024    6:14 AM 05/21/2024    5:16 AM  CBC  WBC 4.0 - 10.5 K/uL 15.3  11.0  13.1   Hemoglobin 12.0 - 15.0 g/dL 40.9  9.7  81.1   Hematocrit 36.0 - 46.0 % 30.2  29.4  34.2   Platelets 150 - 400 K/uL 373  369  414        Latest Ref Rng & Units 05/21/2024    5:16 AM 05/20/2024    6:16 AM 05/19/2024    10:34 AM  CMP  Glucose 70 - 99 mg/dL 914  782  956   BUN 8 - 23 mg/dL 5  9  11    Creatinine 0.44 - 1.00 mg/dL 2.13  0.86  5.78   Sodium 135 - 145 mmol/L 135  131  133   Potassium 3.5 - 5.1 mmol/L 3.9  3.8  4.2   Chloride 98 - 111 mmol/L 101  101  99   CO2 22 - 32 mmol/L 27  22  25    Calcium  8.9 - 10.3 mg/dL 8.4  8.2  8.8   Total Protein 6.5 - 8.1 g/dL   6.2   Total Bilirubin 0.0 - 1.2 mg/dL   0.5   Alkaline Phos 38 - 126 U/L   59   AST 15 - 41 U/L   18   ALT 0 - 44 U/L   19     Imaging: No results found.  Assessment/Plan:   Principal Problem:   Inflammatory bowel disease (ulcerative colitis) (HCC) Active Problems:   Hematochezia   Iron deficiency anemia due to chronic blood loss   Cystocele, unspecified   Patient Summary: Allison Finley. Allison Finley is a 71 F with a history of diverticular disease, GERD, hypothyroidism, hyperlipidemia,  herpes zoster who presented to the ED for bloody diarrhea and was admitted to the IMTS on 5/29 for further workup and management of hematochezia.    Hematochezia Colitis c/f new onset IBD Leukocytosis IDA  Presented with 2 to 3 weeks of loose stools with hematochezia. CT angio showed colitis of the cecum, ascending colon and rectosigmoid; constipation. No vascular abnormalities noted. Last colonoscopy 12/2020 reassuring.  Flexible sigmoidoscopy 5/31 showed diffuse severe inflammation characterized by increased vascularity, friability, edema and mucus in the rectum and in the distal sigmoid colon concerning for colitis. Biopsies were taken to evaluate for IBD. Negative C diff PCR. Remains afebrile.  - GI on board, appreciate assistance  - IV Solu-Medrol 80 mg daily - Hemoglobin 10.0 (9.7), stable -WBC 15.3 (11.0), likely due to starting steroids - Iron 16, Saturation 5, Ferritin 22; Total Iron deficit 765              - Recommend OP IV iron  - Encourage PO intake and hydration  - Continue PPI 20 mg daily - Continue hydrocortisone  1% to be applied  to hemorrhoids up to 3 times daily as needed - Stable, anticipating possibly discharge tomorrow as long as okay with GI. - Will need outpatient GI follow-up   Pancreatic duct dilatation  CT abdomen/pelvis 2022 for nausea/vomiting did not show pancreatic ductal dilatation or surrounding inflammatory changes. CT here noted atrophy of the body and tail of the pancreas with mild dilation of the main pancreatic duct. No discrete mass noted. Changes possibly related to chronic pancreatitis. Alk phos WNL. Recommend outpatient monitoring should patient become symptomatic.   Proteinuria Present in UA. Asymptomatic, no urinary symptoms. Recommend outpatient follow up with PCP.    Hypothyroidism TSH of 5.120 WNL on 03/04/2024 ([facility]). Continued home levothyroxine  112 mcg daily.    Hyperlipidemia Continued home rosuvastatin  5 mg daily at bedtime.   Diet: Regular IVF: None,None VTE: SCDs Wounds: na Code: Full PT/OT recs: None TOC recs: None  Dispo: Anticipated discharge to Home in 0-1 days pending medical management and GI disposition.   Signature: Jearldine Mina, DO Internal Medicine Resident PGY-2 Please contact the on-call pager after 5 pm and on weekends at [phone].

## 2024-05-23 NOTE — TOC Initial Note (Signed)
 Transition of Care (TOC) - Initial/Assessment Note   Spoke with patient at bedside. Patient from home alone. Independent , dose not use any DME. Drives self to appointments.   PCP Terrance Ferretti at [facility]. Patient has called and scheduled a follow appointment     Transition of Care Department [facility]) has reviewed patient and no TOC needs have been identified at this time. We will continue to monitor patient advancement through interdisciplinary progression rounds. If new patient transition needs arise, please place a TOC consult.   Patient Details  Name: Allison Finley MRN: [id number] Date of Birth: 05/05/1951  Transition of Care [facility]) CM/SW Contact:    Terre Ferri, RN Phone Number: 05/23/2024, 10:18 AM  Clinical Narrative:                   Expected Discharge Plan: Home/Self Care Barriers to Discharge: Continued Medical Work up   Patient Goals and CMS Choice Patient states their goals for this hospitalization and ongoing recovery are:: to return to home   Choice offered to / list presented to : NA      Expected Discharge Plan and Services In-house Referral: NA Discharge Planning Services: CM Consult Post Acute Care Choice: NA Living arrangements for the past 2 months: Single Family Home                 DME Arranged: N/A DME Agency: NA       HH Arranged: NA HH Agency: NA        Prior Living Arrangements/Services Living arrangements for the past 2 months: Single Family Home Lives with:: Self Patient language and need for interpreter reviewed:: Yes Do you feel safe going back to the place where you live?: Yes      Need for Family Participation in Patient Care: No (Comment) Care giver support system in place?: Yes (comment)   Criminal Activity/Legal Involvement Pertinent to Current Situation/Hospitalization: No - Comment as needed  Activities of Daily Living   ADL Screening (condition at time of admission) Independently performs  ADLs?: Yes (appropriate for developmental age) Is the patient deaf or have difficulty hearing?: No Does the patient have difficulty seeing, even when wearing glasses/contacts?: No Does the patient have difficulty concentrating, remembering, or making decisions?: No  Permission Sought/Granted   Permission granted to share information with : No              Emotional Assessment Appearance:: Appears stated age Attitude/Demeanor/Rapport: Engaged Affect (typically observed): Appropriate Orientation: : Oriented to Self, Oriented to Place, Oriented to  Time, Oriented to Situation Alcohol / Substance Use: Not Applicable Psych Involvement: No (comment)  Admission diagnosis:  Hematochezia [K92.1] Gastrointestinal hemorrhage, unspecified gastrointestinal hemorrhage type [K92.2] Patient Active Problem List   Diagnosis Date Noted   Iron deficiency anemia due to chronic blood loss 05/22/2024   Cystocele, unspecified 05/22/2024   Inflammatory bowel disease (ulcerative colitis) (HCC) 05/20/2024   Hematochezia 05/19/2024   PCP:  Pcp, No Pharmacy:   [facility] [address], [place] - [address] [place] [address] Phone: [phone] Fax: [phone]  [facility] [address] [place] [address] Phone: [phone] Fax: [phone]     Social Drivers of Health (SDOH) Social History: SDOH Screenings   Food Insecurity: Unknown (05/19/2024)  Housing: Unknown (05/19/2024)  Transportation Needs: No Transportation Needs (05/19/2024)  Utilities: Not At Risk (05/19/2024)  Social Connections: Moderately Isolated (05/19/2024)  Tobacco Use: Low Risk  (05/21/2024)  Recent Concern: Tobacco Use - Medium Risk (03/18/2024)   Received from [facility]   SDOH Interventions:     Readmission Risk Interventions     No data to display

## 2024-05-23 NOTE — Plan of Care (Signed)

## 2024-05-24 ENCOUNTER — Encounter (HOSPITAL_COMMUNITY): Payer: Self-pay | Admitting: Gastroenterology

## 2024-05-24 ENCOUNTER — Other Ambulatory Visit (HOSPITAL_COMMUNITY): Payer: Self-pay

## 2024-05-24 DIAGNOSIS — D500 Iron deficiency anemia secondary to blood loss (chronic): Secondary | ICD-10-CM | POA: Diagnosis not present

## 2024-05-24 DIAGNOSIS — K5130 Ulcerative (chronic) rectosigmoiditis without complications: Secondary | ICD-10-CM | POA: Diagnosis not present

## 2024-05-24 LAB — CBC
HCT: 30.7 % — ABNORMAL LOW (ref 36.0–46.0)
Hemoglobin: 10.2 g/dL — ABNORMAL LOW (ref 12.0–15.0)
MCH: 28.3 pg (ref 26.0–34.0)
MCHC: 33.2 g/dL (ref 30.0–36.0)
MCV: 85.3 fL (ref 80.0–100.0)
Platelets: 461 10*3/uL — ABNORMAL HIGH (ref 150–400)
RBC: 3.6 MIL/uL — ABNORMAL LOW (ref 3.87–5.11)
RDW: 12.8 % (ref 11.5–15.5)
WBC: 15.1 10*3/uL — ABNORMAL HIGH (ref 4.0–10.5)
nRBC: 0 % (ref 0.0–0.2)

## 2024-05-24 LAB — STOOL CULTURE: E coli, Shiga toxin Assay: NEGATIVE

## 2024-05-24 LAB — STOOL CULTURE REFLEX - CMPCXR

## 2024-05-24 LAB — SURGICAL PATHOLOGY

## 2024-05-24 LAB — STOOL CULTURE REFLEX - RSASHR

## 2024-05-24 MED ORDER — HYDROCORTISONE ACETATE 25 MG RE SUPP
25.0000 mg | Freq: Two times a day (BID) | RECTAL | 0 refills | Status: AC | PRN
  Filled 2024-05-24: qty 12, 6d supply, fill #0

## 2024-05-24 MED ORDER — PREDNISONE 10 MG PO TABS
ORAL_TABLET | ORAL | 0 refills | Status: DC
Start: 2024-05-25 — End: 2024-07-18
  Filled 2024-05-24: qty 100, 40d supply, fill #0

## 2024-05-24 MED ORDER — IRON SUCROSE 500 MG IVPB - SIMPLE MED
500.0000 mg | Freq: Once | INTRAVENOUS | Status: DC
  Filled 2024-05-24 (×2): qty 275

## 2024-05-24 MED ORDER — HYDROCORTISONE ACETATE 25 MG RE SUPP
25.0000 mg | Freq: Two times a day (BID) | RECTAL | 0 refills | Status: DC
  Filled 2024-05-24: qty 12, 6d supply, fill #0

## 2024-05-24 MED ORDER — HYDROCORTISONE ACETATE 25 MG RE SUPP: 25.0000 mg | Freq: Two times a day (BID) | RECTAL | Status: DC

## 2024-05-24 MED ORDER — SODIUM CHLORIDE 0.9 % IV SOLN
500.0000 mg | Freq: Once | INTRAVENOUS | Status: AC
  Administered 2024-05-24: 500 mg via INTRAVENOUS
  Filled 2024-05-24: qty 25

## 2024-05-24 NOTE — Discharge Instructions (Signed)
 Ms. Mcfate,  You came to the hospital for blood in your stool.  Will check your labs, your stool studies were negative for infection.  Your hemoglobin stayed up without a drop.  - Please make sure you follow-up with gastroenterology in 2 weeks. - Please avoid any ibuprofen, Aleve, Advil, Goody powders; as it can increase the risk of bleeding from the gut - Please make sure you follow-up with your primary care doctor for a repeat of hemoglobin in 1 week - Please make sure you drink plenty of water and stay hydrated, and eat well.  These are new medications: Start prednisone as follows: - Take 4 tablets, 40 mg, starting tomorrow 6//2025 until 06/03/2024.  Then: - Take 3 tablets, 30 mg, starting 06/04/2024 until 06/13/2024.  Then - Take 2 tablets, 20 mg, starting 06/10/2024 until 06/23/2024 then: - Take 1 tablet, 10 mg, starting 7//2025 until 07/03/2024.  I am also sending you home with Anusol  suppository, she can place 1 suppository rectally as needed for up to 2 times a day for hemorrhoids or anal itching.  You will be contacted for iron replacement therapy through IV.  Otherwise please continue the rest of medications as prescribed.   If you have any of these following symptoms, please call us  or seek care at an emergency department: -Chest Pain -Difficulty Breathing -Worsening abdominal pain -Syncope (passing out) -Drooping of face -Slurred speech -Sudden weakness in your leg or arm -Fever -Chills -blood in the stool -dark black, sticky stool  If you have any questions or concerns, call our clinic at [phone] or after hours call [phone] and ask for the internal medicine resident on call.  I am glad you are feeling better. It was a pleasure taking care for you. I wish a good recovery and good health!   Dr. Lanney Pitts

## 2024-05-24 NOTE — Progress Notes (Signed)
  Progress Note   Date: 05/24/2024  Patient Name: Allison Finley        MRN#: [id number]  Review the patient's clinical findings supports the diagnosis of:   Hyponatremia

## 2024-05-24 NOTE — Progress Notes (Signed)
 Subjective: She reports 2-3 forming stools.  Stools are dark, but there is no overt blood.  Objective: Vital signs in last 24 hours: Temp:  [97.7 F (36.5 C)] 97.7 F (36.5 C) (06/02 1938) Pulse Rate:  [72-83] 83 (06/03 0754) Resp:  [16-18] 17 (06/03 0754) BP: (96-99)/(55-69) 96/55 (06/03 0754) SpO2:  [98 %-99 %] 98 % (06/03 0754) Last BM Date : 05/23/24  Intake/Output from previous day: 06/02 0701 - 06/03 0700 In: 840 [P.O.:840] Out: -  Intake/Output this shift: Total I/O In: 120 [P.O.:120] Out: -   General appearance: alert and no distress GI: soft, non-tender; bowel sounds normal; no masses,  no organomegaly  Lab Results: Recent Labs    05/22/24 0614 05/23/24 0658 05/24/24 0603  WBC 11.0* 15.3* 15.1*  HGB 9.7* 10.0* 10.2*  HCT 29.4* 30.2* 30.7*  PLT 369 373 461*   BMET No results for input(s): "NA", "K", "CL", "CO2", "GLUCOSE", "BUN", "CREATININE", "CALCIUM " in the last 72 hours. LFT No results for input(s): "PROT", "ALBUMIN", "AST", "ALT", "ALKPHOS", "BILITOT", "BILIDIR", "IBILI" in the last 72 hours. PT/INR No results for input(s): "LABPROT", "INR" in the last 72 hours. Hepatitis Panel No results for input(s): "HEPBSAG", "HCVAB", "HEPAIGM", "HEPBIGM" in the last 72 hours. C-Diff No results for input(s): "CDIFFTOX" in the last 72 hours. Fecal Lactopherrin No results for input(s): "FECLLACTOFRN" in the last 72 hours.  Studies/Results: No results found.  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 2) Anemia - stable.   The patient is well clinically.  She is much better in comparison to her admission.  At this time is is reasonable to discharge her home and follow up with Dr. Tova Fresh in one week.   Serology for HBV and TPMT will be obtained as an outpatient in anticipation for starting infliximab.  The reasoning for using the biologic was discussed with her in detail and she is in agreeance.  Plan: 1)  Agree with IV iron infusion. 2) Follow up with Dr. Tova Fresh in 1 week.  LOS: 2 days   Adriana Quinby D 05/24/2024, 1:40 PM

## 2024-05-24 NOTE — Discharge Summary (Signed)
 Name: Allison Finley MRN: [id number] DOB: Dec 08, 1951 73 y.o. PCP: Margaret Sharp, PA-C  Date of Admission: 05/19/2024 10:14 AM Date of Discharge:  05/24/2024 Attending Physician: Dr. Jarvis Mesa   DISCHARGE DIAGNOSIS:  Primary Problem: Inflammatory bowel disease (ulcerative colitis) [facility])   Hospital Problems: Principal Problem:   Inflammatory bowel disease (ulcerative colitis) (HCC) Active Problems:   Hematochezia   Iron deficiency anemia due to chronic blood loss   Cystocele, unspecified    DISCHARGE MEDICATIONS:   Allergies as of 05/24/2024       Reactions   Beef-derived Drug Products    Chicken Allergy    Pork-derived Products Other (See Comments)   vegetarian        Medication List     TAKE these medications    hydrocortisone  25 MG suppository Commonly known as: ANUSOL -HC Place 1 suppository (25 mg total) rectally 2 (two) times daily as needed for up to 7 days for hemorrhoids or anal itching.   levothyroxine  112 MCG tablet Commonly known as: SYNTHROID  Take 112 mcg by mouth daily.   multivitamin with minerals Tabs tablet Take 1 tablet by mouth daily.   omeprazole  20 MG capsule Commonly known as: PRILOSEC Take 1 capsule (20 mg total) by mouth daily.   predniSONE 10 MG tablet Commonly known as: DELTASONE Take 4 tablets (40 mg total) by mouth daily for 10 days, THEN 3 tablets (30 mg total) daily for 10 days, THEN 2 tablets (20 mg total) daily for 10 days, THEN 1 tablet (10 mg total) daily for 10 days. Start taking on: May 25, 2024   rosuvastatin  5 MG tablet Commonly known as: CRESTOR  Take 5 mg by mouth daily.        DISPOSITION AND FOLLOW-UP:  AllisonAllison Finley was discharged from [facility] in Good condition. At the hospital follow up visit please address:  IBD/hematochezia/colitis: Patient is discharged with prednisone taper therapy starting tomorrow 05/25/2024.  Please make sure she follows up with  gastroenterology in about 2 weeks after discharge. Please evaluate her for bowel movements and hematochezia. Please recheck CBC during office visit. Iron deficiency anemia: Patient is given 500 mg IV replacement therapy during hospitalization. Outpatient iron replacement therapy referrals sent. Please repeat iron studies in about 3 months. Please do not start oral iron replacement therapy as GI wants to follow dark stools. Pancreatic duct dilatation CT imaging showed atrophy of the body and tail of the pancreas with mild dilation of the main pancreatic duct.  No discrete mass noted.  Recommend outpatient monitoring if patient becomes symptomatic. Proteinuria Noted on UA, asymptomatic, no urinary symptoms, follow-up in outpatient setting.  Follow-up Recommendations: Consults: None  Labs: CBC Studies: None  Medications:  New Medications: Prednisone therapy therapy, Anusol  suppository  Follow-up Appointments:  Follow-up Information     James, Natalie W, PA-C. Schedule an appointment as soon as possible for a visit on 05/24/2024.   Specialties: Family Medicine, Physician Assistant Why: For hospital follow-up Contact information: [address] [place] [address] [phone]                 HOSPITAL COURSE:  Patient Summary: Allison Finley is a 61 F with a history of diverticular disease, GERD, hypothyroidism, hyperlipidemia, herpes zoster who presented to the ED for bloody diarrhea and was admitted to the IMTS on 5/29 for further workup and management of hematochezia.    IBD Hematochezia Colitis Leukocytosis IDA Patient is a well-appearing and hemodynamically stable 73 yo who  presents for 2-3 week history of diarrheal illness/hematochezia. Pertinent positives include multiple, daily melanotic stools, dizziness, and weakness. Pertinent negatives include absent fever, no nausea/vomiting, no weight loss, no confusion, and no sick contacts. CBC on admission  remarkable for Hgb 11 and 10 on repeat as well as positive FOBT. Rectal exam performed by ED provider positive for external, non-thrombosed hemorrhoids. CTA GI bleed protocol showed findings concerning for active inflammation consistent with colitis of the cecum, ascending colon, and rectosigmoid as well as constipation. GI consulted in ED for colonoscopy when appropriate. Last colonoscopy in 2022 reassuring.  Low suspicion for mesenteric ischemia or ischemic colitis. Patient was given lactated Ringer 's in the ED.  Started on PPI 20 mg daily.  Patient had flexible sigmoidoscopy that was concerning forDiffuse severe inflammation characterized by increased vascularity, friability, edema and mucus in the rectum and the distal sigmoid colon concerning for IBD.  Biopsies were taken that were consistent with IBD.  Negative C. difficile PCR.  Patient was started on IV Solu-Medrol, received 3 doses.  On exam, patient is well, denies any dizziness or lightheadedness.  Denies any abdominal pain.  Tolerating p.o. well.  Iron studies with iron 16, saturation 5, ferritin 22; total iron deficit 765.  Patient received IV iron 500 mg during hospitalization, discharged with referral for IV iron.   Pancreas CT abdomen/pelvis 2022 for nausea/vomiting did not show pancreatic ductal dilatation or surrounding inflammatory changes. CT here noted atrophy of the body and tail of the pancreas with mild dilation of the main pancreatic duct. No discrete mass noted.Changes possibly related to chronic pancreatitis. Today patient denies epigastric pain, Alk phos WNL. Recommend outpatient monitoring should patient become symptomatic.   Proteinuria Present in UA. Asymptomatic. Recommend following up with PCP.    Chronic health problems   Hypothyroidism TSH of 5.120 WNL on 03/04/2024 ([facility]). On levothyroxine  112 mcg daily at home. Resumed here.    Hyperlipidemia On rosuvastatin  5 mg daily at bedtime at home. Resumed here.     DISCHARGE INSTRUCTIONS:   Discharge Instructions     Amb Referral to Intravenous Iron Therapy   Complete by: As directed    You have been referred to [facility] Infusion team for IV Iron Infusions. The infusion pharmacy team will reach out to you with appointment information.    Primary Diagnosis Code for IV Iron: D50.9 - Iron deficiency Anemia   Secondary diagnosis code for IV iron: Other   Comment: hematochezia   Amb Referral to Intravenous Iron Therapy   Complete by: As directed    You have been referred to [facility] Infusion team for IV Iron Infusions. The infusion pharmacy team will reach out to you with appointment information.    Primary Diagnosis Code for IV Iron: D50.9 - Iron deficiency Anemia   Secondary diagnosis code for IV iron: Z61-W96.045 - Ulcerative Colitis   Diet - low sodium heart healthy   Complete by: As directed    Discharge instructions   Complete by: As directed    Allison Finley,  You came to the hospital for blood in your stool.  Will check your labs, your stool studies were negative for infection.  Your hemoglobin stayed up without a drop.  - Please make sure you follow-up with gastroenterology in 2 weeks. - Please avoid any ibuprofen, Aleve, Advil, Goody powders; as it can increase the risk of bleeding from the gut - Please make sure you follow-up with your primary care doctor for a repeat of hemoglobin in  1 week - Please make sure you drink plenty of water and stay hydrated, and eat well.  These are new medications: Start prednisone as follows: - Take 4 tablets, 40 mg, starting tomorrow 05/25/2024 until 06/03/2024.  Then: - Take 3 tablets, 30 mg, starting 06/04/2024 until 06/13/2024.  Then - Take 2 tablets, 20 mg, starting 06/10/2024 until 06/23/2024 then: - Take 1 tablet, 10 mg, starting 7//2025 until 07/03/2024.  I am also sending you home with Anusol  suppository, she can place 1 suppository rectally as needed for up to 2 times a day for hemorrhoids  or anal itching.  You will be contacted for iron replacement therapy through IV.  Otherwise please continue the rest of medications as prescribed.   If you have any of these following symptoms, please call us  or seek care at an emergency department: -Chest Pain -Difficulty Breathing -Worsening abdominal pain -Syncope (passing out) -Drooping of face -Slurred speech -Sudden weakness in your leg or arm -Fever -Chills -blood in the stool -dark black, sticky stool  If you have any questions or concerns, call our clinic at [phone] or after hours call [phone] and ask for the internal medicine resident on call.  I am glad you are feeling better. It was a pleasure taking care for you. I wish a good recovery and good health!   Dr. Lanney Pitts   Increase activity slowly   Complete by: As directed        SUBJECTIVE:  Patient is evaluated bedside, denies any dizzy spells or lightheadedness.  Denies any nausea or vomiting.  Reports that she had 3 bowel movements, with formed stools and noted bright red blood with wiping.  No abdominal pain.  Tolerating p.o. well.  No other concerns this morning.  Discharge Vitals:   BP (!) 96/55 (BP Location: Right Arm)   Pulse 83   Temp 97.7 F (36.5 C) (Oral)   Resp 17   Ht 5\' 2"  (1.575 m)   Wt 58.1 kg   SpO2 98%   BMI 23.41 kg/m   OBJECTIVE:  Physical Exam  General: Pale appearing female, sitting beside bed, no acute distress Cardiovascular: Regular rate, Pulmonary: Breathing comfortably, no wheezing or crackles Abdomen: Soft, nontender, nondistended, bowel sounds present MSK: No lower extremity edema  Pertinent Labs, Studies, and Procedures:     Latest Ref Rng & Units 05/24/2024    6:03 AM 05/23/2024    6:58 AM 05/22/2024    6:14 AM  CBC  WBC 4.0 - 10.5 K/uL 15.1  15.3  11.0   Hemoglobin 12.0 - 15.0 g/dL 29.5  62.1  9.7   Hematocrit 36.0 - 46.0 % 30.7  30.2  29.4   Platelets 150 - 400 K/uL 461  373  369        Latest  Ref Rng & Units 05/21/2024    5:16 AM 05/20/2024    6:16 AM 05/19/2024   10:34 AM  CMP  Glucose 70 - 99 mg/dL 308  657  846   BUN 8 - 23 mg/dL 5  9  11    Creatinine 0.44 - 1.00 mg/dL 9.62  9.52  8.41   Sodium 135 - 145 mmol/L 135  131  133   Potassium 3.5 - 5.1 mmol/L 3.9  3.8  4.2   Chloride 98 - 111 mmol/L 101  101  99   CO2 22 - 32 mmol/L 27  22  25    Calcium  8.9 - 10.3 mg/dL 8.4  8.2  8.8   Total  Protein 6.5 - 8.1 g/dL   6.2   Total Bilirubin 0.0 - 1.2 mg/dL   0.5   Alkaline Phos 38 - 126 U/L   59   AST 15 - 41 U/L   18   ALT 0 - 44 U/L   19     CT ANGIO GI BLEED Result Date: 05/19/2024 CLINICAL DATA:  Lower GI bleed. EXAM: CTA ABDOMEN AND PELVIS WITHOUT AND WITH CONTRAST TECHNIQUE: Multidetector CT imaging of the abdomen and pelvis was performed using the standard protocol during bolus administration of intravenous contrast. Multiplanar reconstructed images and MIPs were obtained and reviewed to evaluate the vascular anatomy. RADIATION DOSE REDUCTION: This exam was performed according to the departmental dose-optimization program which includes automated exposure control, adjustment of the mA and/or kV according to patient size and/or use of iterative reconstruction technique. CONTRAST:  70mL OMNIPAQUE  IOHEXOL  350 MG/ML SOLN COMPARISON:  CT abdomen pelvis dated 08/02/2021. FINDINGS: VASCULAR Aorta: Mild atherosclerotic calcification. No aneurysmal dilatation or dissection. No periaortic fluid collection. Celiac: Patent without evidence of aneurysm, dissection, vasculitis or significant stenosis. SMA: Patent without evidence of aneurysm, dissection, vasculitis or significant stenosis. Renals: Both renal arteries are patent without evidence of aneurysm, dissection, vasculitis, fibromuscular dysplasia or significant stenosis. IMA: Patent without evidence of aneurysm, dissection, vasculitis or significant stenosis. Inflow: Patent without evidence of aneurysm, dissection, vasculitis or significant  stenosis. Proximal Outflow: The visualized proximal heart post patent. Veins: The IVC is unremarkable. The SMV, splenic vein, and main portal vein are patent. No portal venous gas. Review of the MIP images confirms the above findings. NON-VASCULAR Lower chest: Bibasilar linear atelectasis/scarring. No intra-abdominal free air or free fluid. Hepatobiliary: No focal liver abnormality is seen. No gallstones, gallbladder wall thickening, or biliary dilatation. Pancreas: There is atrophy of the body and tail of the pancreas with mild dilatation of the main pancreatic duct of indeterminate etiology, possibly sequela chronic pancreatitis. No discrete mass noted. Spleen: Normal in size without focal abnormality. Adrenals/Urinary Tract: The adrenal glands unremarkable. There is no hydronephrosis or nephrolithiasis on either side. There is symmetric enhancement of the kidneys. The visualized ureters and urinary bladder appear unremarkable. There is herniation of the bladder through the pelvic floor consistent with a cystocele. Stomach/Bowel: There is diffuse thickening of the wall of the rectosigmoid with mucosal enhancement. Additionally there is inflammatory changes and thickening of the cecum and ascending colon with mucosal enhancement. Findings consistent with colitis. Follow-up with colonoscopy after resolution of active inflammation recommended to exclude underlying mass. There is large amount of dense stool throughout the colon. There is no bowel obstruction. No evidence of active GI bleed. The appendix is not visualized with certainty. No inflammatory changes identified in the right lower quadrant. Lymphatic: No adenopathy. Reproductive: Hysterectomy. Other: None Musculoskeletal: Degenerative changes of the spine. No acute osseous pathology. IMPRESSION: 1. No evidence of active GI bleed. 2. Colitis of the cecum, ascending colon, and rectosigmoid. Follow-up with colonoscopy after resolution of active inflammation  recommended to exclude underlying mass. 3. Constipation. No bowel obstruction. 4. Cystocele. Electronically Signed   By: Angus Bark M.D.   On: 05/19/2024 18:41     Signed: Lanney Pitts, D.O.  Internal Medicine Resident, PGY-1 [facility]  Pager: [phone] 2:10 PM, 05/24/2024

## 2024-05-28 ENCOUNTER — Ambulatory Visit: Payer: Self-pay | Admitting: Gastroenterology

## 2024-06-06 NOTE — Progress Notes (Addendum)
 Patient: Allison Finley DOB: [birth year] PCP: Laneta Tanda Lynwood DEVONNA MRN: [id number]   Allison Finley is a 73 y.o. female presenting today for follow-up after being discharged from the hospital 13 days ago. 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 The discharge summary and/or Transitional Care Management documentation was reviewed. Medication reconciliation was performed as indicated via the [facility] as Reviewed timestamp.   EDDIS Finley was contacted by Transitional Care Management services two days after her discharge. This encounter and supporting documentation was reviewed.  The complexity of medical decision making for this patient's transitional care is moderate. History of Present Illness The patient is a 73 year old female presenting for a hospital follow-up.  Patient presents for follow-up after being hospitalized from 05/19/2024 to 05/24/2024.  She presented for bloody diarrhea for 2 to 3 weeks prior.  Reported multiple daily melanotic stools, dizziness, and weakness.  Denied fever, nausea, vomiting, weight loss, confusion, and sick contacts.  CBC on admission showed hemoglobin 11, and 10 on repeat with positive FOBT.  Had CTA that showed findings consistent with colitis of cecum, ascending colon, and rectosigmoid.  Had flexible sigmoidoscopy that showed diffuse severe inflammation concerning for IBD.  Negative C. difficile.  Received 3 doses of IV Solu-Medrol .  Iron  profile showed iron  16, saturation 5, ferritin 22, and she received IV iron  500 mg and discharged with referral for IV iron .  She had asymptomatic proteinuria.  Continued on levothyroxine  112 mcg.  Continued on home rosuvastatin  5 mg daily.  Instructed to follow-up with GI 2 weeks after discharge, with PCP for repeat of CBC in 1 week, and to complete prednisone  taper.  Today: She has been experiencing fatigue, which she attributes to anemia. She is scheduled for  iron  infusions, with the first session set for Thursday and subsequent sessions to occur weekly. She reports feeling weak but not dizzy and notes a decrease in her usual speed of movement. She finds that consuming small amounts of food improves her condition. She has resumed light duty work on Tuesdays and Wednesdays, which she manages by taking frequent breaks and sitting down more often. She is considering rescheduling her iron  infusion for the tomorrow if able.   Her current medication regimen includes prednisone , with a dosage of 3 mg daily, which she has been taking for the past three days. She anticipates transitioning to a biologic treatment after this course of prednisone . Her symptoms improve after eating and taking prednisone . However, she experiences diarrhea and cramps at night, necessitating five to six bathroom visits every other hour. These symptoms subside during the day, likely due to her prednisone  intake. She occasionally observes blood in her stool, which is typically loose. She started taking omeprazole  40 mg daily due to dry heaving in the mornings. Denies feeling nauseated with these episodes. She denies fevers, shortness of breath, and chest pain. She is following up with [name] today.  ADDENDUM: She is very weak due to her iron  deficiency anemia due to blood loss and is wanting help with PT/OT to strengthen to be able to perform ADLs better. She is now hospitalized again and is unable to have follow-up visit to further discuss PT/OT needs.    Behavioral Health Screening  Patient Health Questionnaire-2 Score: 0 (06/06/2024  9:21 AM)      Patient's Depression screening is Negative   Depression Plan: Normal/Negative Screening  Objective Blood pressure (!) 94/52, pulse 107, temperature 97.8 F (36.6 C), weight 60 kg (132 lb 3.2  oz), SpO2 97%. Physical Exam   Physical Exam Constitutional:      General: She is not in acute distress.    Appearance: Normal appearance. She is  not ill-appearing, toxic-appearing or diaphoretic.  HENT:     Head: Normocephalic and atraumatic.     Right Ear: External ear normal.     Left Ear: External ear normal.     Nose: Nose normal.   Eyes:     Extraocular Movements: Extraocular movements intact.    Cardiovascular:     Rate and Rhythm: Normal rate and regular rhythm.     Heart sounds: Normal heart sounds. No murmur heard.    No friction rub. No gallop.  Pulmonary:     Effort: Pulmonary effort is normal. No respiratory distress.     Breath sounds: Normal breath sounds. No stridor. No wheezing, rhonchi or rales.  Abdominal:     General: Abdomen is flat. Bowel sounds are normal. There is no distension.     Palpations: Abdomen is soft. There is no mass.     Tenderness: There is no abdominal tenderness. There is no guarding or rebound.     Hernia: No hernia is present.   Musculoskeletal:     Cervical back: Normal range of motion and neck supple.   Skin:    General: Skin is warm and dry.     Capillary Refill: Capillary refill takes less than 2 seconds.   Neurological:     General: No focal deficit present.     Mental Status: She is alert and oriented to person, place, and time. Mental status is at baseline.   Psychiatric:        Mood and Affect: Mood normal.        Behavior: Behavior normal.        Thought Content: Thought content normal.        Judgment: Judgment normal.     Medical History: Medical History[1]  Patient Active Problem List   Diagnosis Date Noted  . Gastroesophageal reflux disease 06/06/2024  . Ulcerative (chronic) pancolitis with rectal bleeding    (CMD) 06/06/2024  . Cystocele, unspecified 05/22/2024  . Iron  deficiency anemia 05/22/2024  . Ulcerative colitis    (CMD) 05/20/2024  . Hematochezia 05/19/2024  . 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 Constipation 11/17/2022  . Hypercholesteremia 06/12/2016  . Hypothyroidism 06/12/2016  . Osteopenia 06/12/2016  . Psoriasis 06/12/2016    Current Medications:  Medications Ordered Prior to Encounter[2] Current Medications[3]  Allergies: Allergies[4]   Immunizations:  Immunization History  Administered Date(s) Administered  . Hep A, Unspecified 01/14/1996  . Hepatitis A 10/12/2014  . Pfizer SARS-CoV-2 Primary Series 12+ yrs 02/04/2020, 02/28/2020  . TD (Adult), 2 Lf Tetanus Toxoid, Preservative Free 01/14/1996  . TDAP VACCINE (BOOSTRIX,ADACEL) 7Y+ 10/12/2013  . Zoster, Live 04/12/2013    Surgical History- Surgical History[5]  Family history- Family History[6] Social history- Social History[7]   Labs: No results found for this or any previous visit (from the past week).    Assessment & Plan 1. Gastrointestinal hemorrhage, unspecified gastrointestinal hemorrhage type (Primary) 2. Ulcerative rectosigmoiditis with other complication    (CMD) - Reports ongoing diarrhea and cramps, particularly at night, and occasional blood in stool. - Increased weakness and fatigue, especially during the night. - Advised to continue prednisone  regimen, tapering as directed, and follow up with [name] for further management. - Recommended to eat small, frequent meals and stay hydrated to manage symptoms. - CBC with Differential - Anemia Profile -  omeprazole  (PriLOSEC) 40 mg DR capsule; Take 1 capsule (40 mg total) by mouth in the morning.  Dispense: 90 capsule; Refill: 3  3. Iron  deficiency anemia due to chronic blood loss - Contributing to fatigue and low blood pressure. - Scheduled for iron  infusions, with the first one potentially being tomorrow. - Advised to have someone drive to infusion appointments due to expected initial fatigue post-infusion. - Blood test to be conducted today to assess current iron  levels. - Advised to drink plenty of water and eat regularly to help manage blood pressure. - Recommended to stand up slowly to avoid dizziness and potential falls. - Blood pressure is soft in office today but improved to  94/52. Suspect this will improve with improving anemia. Will monitor. - CBC with Differential - Anemia Profile - omeprazole  (PriLOSEC) 40 mg DR capsule; Take 1 capsule (40 mg total) by mouth in the morning.  Dispense: 90 capsule; Refill: 3     Problem List Items Addressed This Visit     Iron  deficiency anemia   Relevant Medications   omeprazole  (PriLOSEC) 40 mg DR capsule   Other Relevant Orders   CBC with Differential   Anemia Profile   Ulcerative colitis    (CMD)   Other Visit Diagnoses       Gastrointestinal hemorrhage, unspecified gastrointestinal hemorrhage type    -  Primary   Relevant Medications   omeprazole  (PriLOSEC) 40 mg DR capsule   Other Relevant Orders   CBC with Differential   Anemia Profile       Monitor: The problem is unchanged Evaluation: Labs/tests ordered, see encounter summary Assessment/Treatment Managed by specialist  Please contact my office for worsening conditions or problems, and seek emergency medical treatment and/or call 911 if you or your family deems either necessary.  Patient Instructions  Treatment Plan: Continue prednisone  regimen, tapering from three tablets a day to one. Follow up with [name]. Eat small, frequent meals and stay hydrated. Scheduled for iron  infusions, with the first one potentially tomorrow. Blood test to be conducted today to assess current iron  levels.   Follow-Up Instructions: Have someone drive to infusion appointments due to expected initial fatigue post-infusion. Drink plenty of water and eat regularly to help manage blood pressure. Stand up slowly to avoid dizziness and potential falls. Follow up with [name] for further management.    Return if symptoms worsen or fail to improve, for Next scheduled follow up.  Portions of this note were created using [employer] Copilot software. Although proofread, errors may be present.  I have personally spent 40 minutes involved in face-to-face and non-face-to-face  activities for this patient on the day of the visit.  Professional time spent includes the following activities, in addition to those noted in the documentation:  - preparing to see the patient (e.g., review of recent and/or remote lab/imaging/study results, provider notes, and patient messages/phone calls available in current EMR, CareEverywhere, and scanned records) -obtaining and/or reviewing separately obtained history either through past provider notes, patient phone calls, and/or patient's family member(s)/caregiver(s) -performing a medically appropriate examination and/or evaluation -counseling and educating the patient/family/caregiver -ordering medications, tests, or procedures -documenting clinical information in the electronic or other health record -reviewing most up to date studies or expert consensus guidelines for screening/diagnosing/treating pertinent conditions/symptoms -independently interpreting results (not separately reported) and communicating results to the patient/family/caregiver -care coordination (not separately reported) -referring and communicating with other health care professionals (when not separately reported)  Laneta Tanda Agent, PA-C 9:57 AM       [  1] Past Medical History: Diagnosis Date  . Cataract    I had eye surgery on both eyes in 2023  . Eczema 2014  . Gastroesophageal reflux disease 06/06/2024  . Hypercholesterolemia   . Hypothyroidism   . Ulcerative colitis    (CMD) 05/20/2024  . Varicella   [2] Current Outpatient Medications on File Prior to Visit  Medication Sig Dispense Refill  . clobetasoL (TEMOVATE) 0.05 % cream Apply 1 Application topically 2 (two) times a day. 60 g 0  . levothyroxine  (SYNTHROID ) 112 mcg tablet TAKE 1 TABLET EVERY MORNING 60 tablet 5  . predniSONE  (DELTASONE ) 10 mg tablet Take by mouth.    . rosuvastatin  (CRESTOR ) 5 mg tablet TAKE 1 TABLET EVERY DAY 90 tablet 3  . [DISCONTINUED] omeprazole  (PriLOSEC) 20 mg DR  capsule TAKE 1 CAPSULE EVERY DAY 90 capsule 3  . [DISCONTINUED] ascorbic acid ,sod/zinc  gluc,ox (ZINC  AND C ORAL) Take by mouth. (Patient not taking: Reported on 06/06/2024)    . [DISCONTINUED] b complex vitamins tab tablet Take 1 tablet by mouth Once Daily. (Patient not taking: Reported on 06/06/2024)    . [DISCONTINUED] biotin (MERIBIN) 5 mg capsule Take 5 mg by mouth daily. (Patient not taking: Reported on 06/06/2024)    . [DISCONTINUED] cholecalciferol (VITAMIN D3) 1,000 unit (25 mcg) tablet Take 1,000 Units by mouth Once Daily. (Patient not taking: Reported on 06/06/2024)    . [DISCONTINUED] fluticasone propionate (FLONASE) 50 mcg/spray nasal spray Administer 2 sprays into each nostril daily. (Patient not taking: Reported on 06/06/2024) 15.8 mL 2  . [DISCONTINUED] magnesium 250 mg tablet Take  by mouth. (Patient not taking: Reported on 06/06/2024)     No current facility-administered medications on file prior to visit.  [3]  Current Outpatient Medications:  .  clobetasoL (TEMOVATE) 0.05 % cream, Apply 1 Application topically 2 (two) times a day., Disp: 60 g, Rfl: 0 .  levothyroxine  (SYNTHROID ) 112 mcg tablet, TAKE 1 TABLET EVERY MORNING, Disp: 60 tablet, Rfl: 5 .  predniSONE  (DELTASONE ) 10 mg tablet, Take by mouth., Disp: , Rfl:  .  rosuvastatin  (CRESTOR ) 5 mg tablet, TAKE 1 TABLET EVERY DAY, Disp: 90 tablet, Rfl: 3 .  omeprazole  (PriLOSEC) 40 mg DR capsule, Take 1 capsule (40 mg total) by mouth in the morning., Disp: 90 capsule, Rfl: 3 [4] No Known Allergies [5] Past Surgical History: Procedure Laterality Date  . EYE SURGERY  2023   Cataract  . MOUTH SURGERY      Procedure: MOUTH SURGERY  [6] Family History Problem Relation Name Age of Onset  . High Cholesterol Mother Cy Brooks   . Rheum arthritis Mother Cy Brooks   . Arthritis Mother Cy Brooks   . Hearing loss Mother Cy Brooks   . Hypertension Mother Cy Brooks   . COPD Brother Elsie Hurst   . Hearing loss Brother Elsie Hurst   .  Diabetes Son Kiki Sax   [7] Social History Socioeconomic History  . Marital status: Single  Tobacco Use  . Smoking status: Former  . Smokeless tobacco: Never  Substance and Sexual Activity  . Alcohol use: No  . Drug use: Never  . Sexual activity: Not Currently   Social Drivers of Health   Food Insecurity: Low Risk  (06/06/2024)   Food vital sign   . Within the past 12 months, you worried that your food would run out before you got money to buy more: Never true   . Within the past 12 months, the food you bought just didn't  last and you didn't have money to get more: Never true  Transportation Needs: No Transportation Needs (06/06/2024)   Transportation   . In the past 12 months, has lack of reliable transportation kept you from medical appointments, meetings, work or from getting things needed for daily living? : No  Safety: Low Risk  (06/06/2024)   Safety   . How often does anyone, including family and friends, physically hurt you?: Never   . How often does anyone, including family and friends, insult or talk down to you?: Never   . How often does anyone, including family and friends, threaten you with harm?: Never   . How often does anyone, including family and friends, scream or curse at you?: Never  Living Situation: Low Risk  (06/06/2024)   Living Situation   . What is your living situation today?: I have a steady place to live   . Think about the place you live. Do you have problems with any of the following? Choose all that apply:: None/None on this list

## 2024-06-09 ENCOUNTER — Ambulatory Visit (HOSPITAL_COMMUNITY)
Admission: RE | Admit: 2024-06-09 | Discharge: 2024-06-09 | Disposition: A | Discharge status: Home / Self Care | Source: Ambulatory Visit | Attending: Internal Medicine | Admitting: Internal Medicine

## 2024-06-09 DIAGNOSIS — K922 Gastrointestinal hemorrhage, unspecified: Secondary | ICD-10-CM | POA: Insufficient documentation

## 2024-06-09 DIAGNOSIS — D509 Iron deficiency anemia, unspecified: Secondary | ICD-10-CM | POA: Diagnosis present

## 2024-06-09 DIAGNOSIS — D500 Iron deficiency anemia secondary to blood loss (chronic): Secondary | ICD-10-CM | POA: Diagnosis present

## 2024-06-09 MED ORDER — IRON SUCROSE 200 MG IVPB - SIMPLE MED
200.0000 mg | Status: DC
  Filled 2024-06-09: qty 200

## 2024-06-09 MED ORDER — IRON SUCROSE 200 MG IVPB - SIMPLE MED
200.0000 mg | Status: DC
  Administered 2024-06-09: 200 mg via INTRAVENOUS
  Filled 2024-06-09: qty 110

## 2024-06-13 ENCOUNTER — Ambulatory Visit (HOSPITAL_COMMUNITY)
Admission: RE | Admit: 2024-06-13 | Discharge: 2024-06-13 | Disposition: A | Discharge status: Home / Self Care | Source: Ambulatory Visit | Attending: Internal Medicine

## 2024-06-13 DIAGNOSIS — D500 Iron deficiency anemia secondary to blood loss (chronic): Secondary | ICD-10-CM | POA: Diagnosis not present

## 2024-06-13 DIAGNOSIS — D509 Iron deficiency anemia, unspecified: Secondary | ICD-10-CM

## 2024-06-13 MED ORDER — IRON SUCROSE 200 MG IVPB - SIMPLE MED
200.0000 mg | Status: DC
  Administered 2024-06-13: 200 mg via INTRAVENOUS
  Filled 2024-06-13: qty 200

## 2024-06-15 ENCOUNTER — Ambulatory Visit (HOSPITAL_COMMUNITY)
Admission: RE | Admit: 2024-06-15 | Discharge: 2024-06-15 | Disposition: A | Discharge status: Home / Self Care | Source: Ambulatory Visit | Attending: Internal Medicine

## 2024-06-15 DIAGNOSIS — D500 Iron deficiency anemia secondary to blood loss (chronic): Secondary | ICD-10-CM | POA: Diagnosis not present

## 2024-06-15 DIAGNOSIS — D509 Iron deficiency anemia, unspecified: Secondary | ICD-10-CM

## 2024-06-15 MED ORDER — IRON SUCROSE 200 MG IVPB - SIMPLE MED
200.0000 mg | Status: DC
  Administered 2024-06-15: 200 mg via INTRAVENOUS
  Filled 2024-06-15: qty 200

## 2024-06-17 ENCOUNTER — Encounter (HOSPITAL_COMMUNITY)
Admission: RE | Admit: 2024-06-17 | Discharge: 2024-06-17 | Disposition: A | Discharge status: Home / Self Care | Source: Ambulatory Visit | Attending: Internal Medicine | Admitting: Internal Medicine

## 2024-06-17 DIAGNOSIS — D500 Iron deficiency anemia secondary to blood loss (chronic): Secondary | ICD-10-CM | POA: Insufficient documentation

## 2024-06-17 DIAGNOSIS — D509 Iron deficiency anemia, unspecified: Secondary | ICD-10-CM | POA: Insufficient documentation

## 2024-06-17 MED ORDER — IRON SUCROSE 200 MG IVPB - SIMPLE MED
200.0000 mg | Freq: Once | Status: AC
  Administered 2024-06-17: 200 mg via INTRAVENOUS
  Filled 2024-06-17: qty 200

## 2024-06-17 MED ORDER — IRON SUCROSE 200 MG IVPB - SIMPLE MED
200.0000 mg | Status: DC
  Filled 2024-06-17: qty 110

## 2024-06-20 ENCOUNTER — Ambulatory Visit (HOSPITAL_COMMUNITY)
Admission: RE | Admit: 2024-06-20 | Discharge: 2024-06-20 | Disposition: A | Discharge status: Home / Self Care | Source: Ambulatory Visit | Attending: Internal Medicine | Admitting: Internal Medicine

## 2024-06-20 DIAGNOSIS — D500 Iron deficiency anemia secondary to blood loss (chronic): Secondary | ICD-10-CM

## 2024-06-20 DIAGNOSIS — D509 Iron deficiency anemia, unspecified: Secondary | ICD-10-CM

## 2024-06-20 MED ORDER — IRON SUCROSE 200 MG IVPB - SIMPLE MED
200.0000 mg | Freq: Once | Status: AC
  Administered 2024-06-20: 200 mg via INTRAVENOUS
  Filled 2024-06-20: qty 200

## 2024-07-01 ENCOUNTER — Emergency Department (HOSPITAL_COMMUNITY)

## 2024-07-01 ENCOUNTER — Other Ambulatory Visit: Payer: Self-pay

## 2024-07-01 ENCOUNTER — Encounter (HOSPITAL_COMMUNITY): Admission: EM | Disposition: A | Discharge status: Skilled Nursing Facility | Payer: Self-pay | Source: Home / Self Care

## 2024-07-01 ENCOUNTER — Emergency Department (HOSPITAL_COMMUNITY): Admitting: Registered Nurse

## 2024-07-01 ENCOUNTER — Encounter (HOSPITAL_COMMUNITY): Payer: Self-pay

## 2024-07-01 ENCOUNTER — Inpatient Hospital Stay (HOSPITAL_COMMUNITY)
Admission: EM | Admit: 2024-07-01 | Discharge: 2024-07-18 | DRG: 326 | Disposition: A | Discharge status: Skilled Nursing Facility

## 2024-07-01 DIAGNOSIS — M51369 Other intervertebral disc degeneration, lumbar region without mention of lumbar back pain or lower extremity pain: Secondary | ICD-10-CM | POA: Diagnosis present

## 2024-07-01 DIAGNOSIS — N814 Uterovaginal prolapse, unspecified: Secondary | ICD-10-CM | POA: Diagnosis present

## 2024-07-01 DIAGNOSIS — K51311 Ulcerative (chronic) rectosigmoiditis with rectal bleeding: Secondary | ICD-10-CM | POA: Diagnosis present

## 2024-07-01 DIAGNOSIS — Z751 Person awaiting admission to adequate facility elsewhere: Secondary | ICD-10-CM

## 2024-07-01 DIAGNOSIS — R188 Other ascites: Secondary | ICD-10-CM | POA: Diagnosis present

## 2024-07-01 DIAGNOSIS — Z6824 Body mass index (BMI) 24.0-24.9, adult: Secondary | ICD-10-CM

## 2024-07-01 DIAGNOSIS — B9681 Helicobacter pylori [H. pylori] as the cause of diseases classified elsewhere: Secondary | ICD-10-CM | POA: Diagnosis present

## 2024-07-01 DIAGNOSIS — R3129 Other microscopic hematuria: Secondary | ICD-10-CM | POA: Diagnosis present

## 2024-07-01 DIAGNOSIS — K51319 Ulcerative (chronic) rectosigmoiditis with unspecified complications: Secondary | ICD-10-CM

## 2024-07-01 DIAGNOSIS — E871 Hypo-osmolality and hyponatremia: Secondary | ICD-10-CM | POA: Diagnosis not present

## 2024-07-01 DIAGNOSIS — K8689 Other specified diseases of pancreas: Secondary | ICD-10-CM | POA: Diagnosis present

## 2024-07-01 DIAGNOSIS — E43 Unspecified severe protein-calorie malnutrition: Secondary | ICD-10-CM | POA: Diagnosis present

## 2024-07-01 DIAGNOSIS — K275 Chronic or unspecified peptic ulcer, site unspecified, with perforation: Secondary | ICD-10-CM

## 2024-07-01 DIAGNOSIS — K660 Peritoneal adhesions (postprocedural) (postinfection): Secondary | ICD-10-CM | POA: Diagnosis present

## 2024-07-01 DIAGNOSIS — L89152 Pressure ulcer of sacral region, stage 2: Secondary | ICD-10-CM | POA: Diagnosis present

## 2024-07-01 DIAGNOSIS — E785 Hyperlipidemia, unspecified: Secondary | ICD-10-CM | POA: Diagnosis present

## 2024-07-01 DIAGNOSIS — R000 Tachycardia, unspecified: Secondary | ICD-10-CM | POA: Diagnosis present

## 2024-07-01 DIAGNOSIS — M51379 Other intervertebral disc degeneration, lumbosacral region without mention of lumbar back pain or lower extremity pain: Secondary | ICD-10-CM | POA: Diagnosis present

## 2024-07-01 DIAGNOSIS — L89323 Pressure ulcer of left buttock, stage 3: Secondary | ICD-10-CM | POA: Diagnosis present

## 2024-07-01 DIAGNOSIS — Z79890 Hormone replacement therapy (postmenopausal): Secondary | ICD-10-CM

## 2024-07-01 DIAGNOSIS — K256 Chronic or unspecified gastric ulcer with both hemorrhage and perforation: Principal | ICD-10-CM | POA: Diagnosis present

## 2024-07-01 DIAGNOSIS — W19XXXA Unspecified fall, initial encounter: Secondary | ICD-10-CM | POA: Diagnosis not present

## 2024-07-01 DIAGNOSIS — R152 Fecal urgency: Secondary | ICD-10-CM | POA: Diagnosis present

## 2024-07-01 DIAGNOSIS — N390 Urinary tract infection, site not specified: Secondary | ICD-10-CM | POA: Diagnosis present

## 2024-07-01 DIAGNOSIS — E039 Hypothyroidism, unspecified: Secondary | ICD-10-CM | POA: Diagnosis present

## 2024-07-01 DIAGNOSIS — Z79899 Other long term (current) drug therapy: Secondary | ICD-10-CM

## 2024-07-01 DIAGNOSIS — K3189 Other diseases of stomach and duodenum: Principal | ICD-10-CM | POA: Diagnosis present

## 2024-07-01 DIAGNOSIS — D62 Acute posthemorrhagic anemia: Secondary | ICD-10-CM | POA: Diagnosis not present

## 2024-07-01 DIAGNOSIS — D638 Anemia in other chronic diseases classified elsewhere: Secondary | ICD-10-CM | POA: Diagnosis present

## 2024-07-01 DIAGNOSIS — K5100 Ulcerative (chronic) pancolitis without complications: Secondary | ICD-10-CM

## 2024-07-01 DIAGNOSIS — M47816 Spondylosis without myelopathy or radiculopathy, lumbar region: Secondary | ICD-10-CM | POA: Diagnosis present

## 2024-07-01 DIAGNOSIS — Z7952 Long term (current) use of systemic steroids: Secondary | ICD-10-CM

## 2024-07-01 DIAGNOSIS — F419 Anxiety disorder, unspecified: Secondary | ICD-10-CM | POA: Diagnosis present

## 2024-07-01 DIAGNOSIS — I959 Hypotension, unspecified: Secondary | ICD-10-CM | POA: Diagnosis present

## 2024-07-01 DIAGNOSIS — A048 Other specified bacterial intestinal infections: Secondary | ICD-10-CM

## 2024-07-01 DIAGNOSIS — L89313 Pressure ulcer of right buttock, stage 3: Secondary | ICD-10-CM | POA: Diagnosis present

## 2024-07-01 DIAGNOSIS — G8929 Other chronic pain: Secondary | ICD-10-CM | POA: Diagnosis present

## 2024-07-01 HISTORY — DX: Diverticulosis of intestine, part unspecified, without perforation or abscess without bleeding: K57.90

## 2024-07-01 HISTORY — DX: Gastro-esophageal reflux disease without esophagitis: K21.9

## 2024-07-01 HISTORY — DX: Hypothyroidism, unspecified: E03.9

## 2024-07-01 HISTORY — DX: Iron deficiency anemia secondary to blood loss (chronic): D50.0

## 2024-07-01 HISTORY — DX: Melena: K92.1

## 2024-07-01 HISTORY — DX: Cystocele, unspecified: N81.10

## 2024-07-01 HISTORY — DX: Psoriasis, unspecified: L40.9

## 2024-07-01 HISTORY — PX: LAPAROTOMY: SHX154

## 2024-07-01 LAB — URINALYSIS, W/ REFLEX TO CULTURE (INFECTION SUSPECTED)
Bilirubin Urine: NEGATIVE
Glucose, UA: NEGATIVE mg/dL
Ketones, ur: 5 mg/dL — AB
Nitrite: NEGATIVE
Protein, ur: 30 mg/dL — AB
Specific Gravity, Urine: >1.046 — ABNORMAL HIGH (ref 1.005–1.030)
pH: 5 (ref 5.0–8.0)

## 2024-07-01 LAB — CBC WITH DIFFERENTIAL/PLATELET
Abs Immature Granulocytes: 0 K/uL (ref 0.00–0.07)
Basophils Absolute: 0 K/uL (ref 0.0–0.1)
Basophils Relative: 0 %
Eosinophils Absolute: 0 K/uL (ref 0.0–0.5)
Eosinophils Relative: 0 %
HCT: 27.4 % — ABNORMAL LOW (ref 36.0–46.0)
Hemoglobin: 8.4 g/dL — ABNORMAL LOW (ref 12.0–15.0)
Lymphocytes Relative: 8 %
Lymphs Abs: 1 K/uL (ref 0.7–4.0)
MCH: 27.5 pg (ref 26.0–34.0)
MCHC: 30.7 g/dL (ref 30.0–36.0)
MCV: 89.8 fL (ref 80.0–100.0)
Monocytes Absolute: 1.5 K/uL — ABNORMAL HIGH (ref 0.1–1.0)
Monocytes Relative: 13 %
Neutro Abs: 9.4 K/uL — ABNORMAL HIGH (ref 1.7–7.7)
Neutrophils Relative %: 79 %
Platelets: 281 K/uL (ref 150–400)
RBC: 3.05 MIL/uL — ABNORMAL LOW (ref 3.87–5.11)
RDW: 18 % — ABNORMAL HIGH (ref 11.5–15.5)
WBC: 11.9 K/uL — ABNORMAL HIGH (ref 4.0–10.5)
nRBC: 0 /100{WBCs}
nRBC: 0.2 % (ref 0.0–0.2)

## 2024-07-01 LAB — COMPREHENSIVE METABOLIC PANEL WITH GFR
ALT: 22 U/L (ref 0–44)
AST: 18 U/L (ref 15–41)
Albumin: 1.9 g/dL — ABNORMAL LOW (ref 3.5–5.0)
Alkaline Phosphatase: 66 U/L (ref 38–126)
Anion gap: 6 (ref 5–15)
BUN: 23 mg/dL (ref 8–23)
CO2: 26 mmol/L (ref 22–32)
Calcium: 8.3 mg/dL — ABNORMAL LOW (ref 8.9–10.3)
Chloride: 94 mmol/L — ABNORMAL LOW (ref 98–111)
Creatinine, Ser: 0.51 mg/dL (ref 0.44–1.00)
GFR, Estimated: >60 mL/min (ref >60)
Glucose, Bld: 200 mg/dL — ABNORMAL HIGH (ref 70–99)
Potassium: 4 mmol/L (ref 3.5–5.1)
Sodium: 126 mmol/L — ABNORMAL LOW (ref 135–145)
Total Bilirubin: 0.4 mg/dL (ref 0.0–1.2)
Total Protein: 5.4 g/dL — ABNORMAL LOW (ref 6.5–8.1)

## 2024-07-01 LAB — I-STAT CG4 LACTIC ACID, ED
Lactic Acid, Venous: 2.4 mmol/L (ref 0.5–1.9)
Lactic Acid, Venous: 2.6 mmol/L (ref 0.5–1.9)

## 2024-07-01 LAB — PROTIME-INR
INR: 1.2 (ref 0.8–1.2)
Prothrombin Time: 16.1 s — ABNORMAL HIGH (ref 11.4–15.2)

## 2024-07-01 LAB — PREPARE RBC (CROSSMATCH)

## 2024-07-01 SURGERY — LAPAROTOMY, EXPLORATORY
Anesthesia: General | Site: Abdomen

## 2024-07-01 MED ORDER — SUCCINYLCHOLINE CHLORIDE 200 MG/10ML IV SOSY
PREFILLED_SYRINGE | INTRAVENOUS | Status: DC | PRN
Start: 2024-07-01 — End: 2024-07-02
  Administered 2024-07-01: 80 mg via INTRAVENOUS

## 2024-07-01 MED ORDER — SUCCINYLCHOLINE CHLORIDE 200 MG/10ML IV SOSY
PREFILLED_SYRINGE | INTRAVENOUS | Status: AC
  Filled 2024-07-01: qty 10

## 2024-07-01 MED ORDER — FENTANYL CITRATE PF 50 MCG/ML IJ SOSY
25.0000 ug | PREFILLED_SYRINGE | Freq: Once | INTRAMUSCULAR | Status: AC
  Administered 2024-07-01: 25 ug via INTRAVENOUS
  Filled 2024-07-01: qty 1

## 2024-07-01 MED ORDER — DEXAMETHASONE SODIUM PHOSPHATE 10 MG/ML IJ SOLN
INTRAMUSCULAR | Status: DC | PRN
Start: 2024-07-01 — End: 2024-07-02
  Administered 2024-07-01: 5 mg via INTRAVENOUS

## 2024-07-01 MED ORDER — IOHEXOL 350 MG/ML SOLN
60.0000 mL | Freq: Once | INTRAVENOUS | Status: AC | PRN
  Administered 2024-07-01: 60 mL via INTRAVENOUS

## 2024-07-01 MED ORDER — CEFAZOLIN SODIUM-DEXTROSE 2-4 GM/100ML-% IV SOLN
INTRAVENOUS | Status: AC
  Filled 2024-07-01: qty 100

## 2024-07-01 MED ORDER — SODIUM CHLORIDE 0.9% IV SOLUTION: Freq: Once | INTRAVENOUS | Status: DC

## 2024-07-01 MED ORDER — ACETAMINOPHEN 10 MG/ML IV SOLN
INTRAVENOUS | Status: AC
  Filled 2024-07-01: qty 100

## 2024-07-01 MED ORDER — ROCURONIUM BROMIDE 10 MG/ML (PF) SYRINGE
PREFILLED_SYRINGE | INTRAVENOUS | Status: DC | PRN
  Administered 2024-07-01: 50 mg via INTRAVENOUS

## 2024-07-01 MED ORDER — PROPOFOL 10 MG/ML IV BOLUS
INTRAVENOUS | Status: DC | PRN
  Administered 2024-07-01: 70 mg via INTRAVENOUS

## 2024-07-01 MED ORDER — PROPOFOL 10 MG/ML IV BOLUS
INTRAVENOUS | Status: AC
  Filled 2024-07-01: qty 20

## 2024-07-01 MED ORDER — FENTANYL CITRATE (PF) 250 MCG/5ML IJ SOLN
INTRAMUSCULAR | Status: AC
  Filled 2024-07-01: qty 5

## 2024-07-01 MED ORDER — PHENYLEPHRINE HCL-NACL 20-0.9 MG/250ML-% IV SOLN
INTRAVENOUS | Status: DC | PRN
  Administered 2024-07-01: 30 ug/min via INTRAVENOUS

## 2024-07-01 MED ORDER — FENTANYL CITRATE (PF) 250 MCG/5ML IJ SOLN
INTRAMUSCULAR | Status: DC | PRN
  Administered 2024-07-01: 100 ug via INTRAVENOUS

## 2024-07-01 MED ORDER — LIDOCAINE 2% (20 MG/ML) 5 ML SYRINGE
INTRAMUSCULAR | Status: DC | PRN
  Administered 2024-07-01: 60 mg via INTRAVENOUS

## 2024-07-01 MED ORDER — PHENYLEPHRINE 80 MCG/ML (10ML) SYRINGE FOR IV PUSH (FOR BLOOD PRESSURE SUPPORT)
PREFILLED_SYRINGE | INTRAVENOUS | Status: DC | PRN
  Administered 2024-07-01 (×3): 80 ug via INTRAVENOUS

## 2024-07-01 MED ORDER — FENTANYL CITRATE PF 50 MCG/ML IJ SOSY
50.0000 ug | PREFILLED_SYRINGE | Freq: Once | INTRAMUSCULAR | Status: AC
  Administered 2024-07-01: 50 ug via INTRAVENOUS
  Filled 2024-07-01: qty 1

## 2024-07-01 MED ORDER — SODIUM CHLORIDE 0.9 % IV SOLN: INTRAVENOUS | Status: DC | PRN

## 2024-07-01 MED ORDER — ALBUMIN HUMAN 5 % IV SOLN
INTRAVENOUS | Status: DC | PRN
Start: 2024-07-01 — End: 2024-07-02

## 2024-07-01 MED ORDER — SODIUM CHLORIDE 0.9 % IV BOLUS
1000.0000 mL | Freq: Once | INTRAVENOUS | Status: AC
  Administered 2024-07-01: 1000 mL via INTRAVENOUS

## 2024-07-01 MED ORDER — CEFAZOLIN SODIUM-DEXTROSE 2-3 GM-%(50ML) IV SOLR
INTRAVENOUS | Status: DC | PRN
  Administered 2024-07-01: 2 g via INTRAVENOUS

## 2024-07-01 MED ORDER — ATROPINE SULFATE 0.4 MG/ML IV SOLN
INTRAVENOUS | Status: AC
  Filled 2024-07-01: qty 1

## 2024-07-01 SURGICAL SUPPLY — 39 items
BAG COUNTER SPONGE SURGICOUNT (BAG) ×1 IMPLANT
CANISTER SUCTION 3000ML PPV (SUCTIONS) ×1 IMPLANT
CHLORAPREP W/TINT 26 (MISCELLANEOUS) ×1 IMPLANT
COVER SURGICAL LIGHT HANDLE (MISCELLANEOUS) ×1 IMPLANT
DRAIN 10X20 FULL PER LF SIL ST (DRAIN) IMPLANT
DRAIN CHANNEL 19F RND (DRAIN) IMPLANT
DRAPE LAPAROSCOPIC ABDOMINAL (DRAPES) ×1 IMPLANT
DRAPE WARM FLUID 44X44 (DRAPES) ×1 IMPLANT
DRSG OPSITE POSTOP 4X8 (GAUZE/BANDAGES/DRESSINGS) IMPLANT
ELECT CAUTERY BLADE 6.4 (BLADE) IMPLANT
ELECTRODE BLDE 4.0 EZ CLN MEGD (MISCELLANEOUS) IMPLANT
ELECTRODE REM PT RTRN 9FT ADLT (ELECTROSURGICAL) ×1 IMPLANT
GAUZE SPONGE 2X2 STRL 8-PLY (GAUZE/BANDAGES/DRESSINGS) IMPLANT
GLOVE BIO SURGEON STRL SZ7.5 (GLOVE) ×1 IMPLANT
GLOVE BIOGEL PI IND STRL 6.5 (GLOVE) IMPLANT
GLOVE INDICATOR 8.0 STRL GRN (GLOVE) ×1 IMPLANT
GOWN STRL REUS W/ TWL LRG LVL3 (GOWN DISPOSABLE) ×1 IMPLANT
GOWN STRL REUS W/ TWL XL LVL3 (GOWN DISPOSABLE) ×1 IMPLANT
GOWN STRL SURGICAL XL XLNG (GOWN DISPOSABLE) IMPLANT
HANDLE SUCTION POOLE (INSTRUMENTS) ×1 IMPLANT
KIT BASIN OR (CUSTOM PROCEDURE TRAY) ×1 IMPLANT
KIT TURNOVER KIT B (KITS) ×1 IMPLANT
LIGASURE IMPACT 36 18CM CVD LR (INSTRUMENTS) IMPLANT
MANIFOLD NEPTUNE II (INSTRUMENTS) IMPLANT
NS IRRIG 1000ML POUR BTL (IV SOLUTION) ×2 IMPLANT
PACK GENERAL/GYN (CUSTOM PROCEDURE TRAY) ×1 IMPLANT
PAD ARMBOARD POSITIONER FOAM (MISCELLANEOUS) ×1 IMPLANT
PENCIL SMOKE EVACUATOR (MISCELLANEOUS) ×1 IMPLANT
STAPLER SKIN PROX 35W (STAPLE) ×1 IMPLANT
SUT PDS AB 1 CT1 36 (SUTURE) IMPLANT
SUT PDS AB 1 TP1 96 (SUTURE) ×2 IMPLANT
SUT SILK 2 0 SH CR/8 (SUTURE) ×1 IMPLANT
SUT SILK 2 0 TIES 10X30 (SUTURE) ×1 IMPLANT
SUT SILK 3 0 SH CR/8 (SUTURE) ×1 IMPLANT
SUT SILK 3 0 TIES 10X30 (SUTURE) ×1 IMPLANT
SUT SILK 3 0SH CR/8 30 (SUTURE) IMPLANT
TOWEL GREEN STERILE (TOWEL DISPOSABLE) ×1 IMPLANT
TRAY FOLEY MTR SLVR 14FR STAT (SET/KITS/TRAYS/PACK) IMPLANT
TRAY FOLEY MTR SLVR 16FR STAT (SET/KITS/TRAYS/PACK) ×1 IMPLANT

## 2024-07-01 NOTE — Telephone Encounter (Signed)
 Dx code on referral is Y93.B9 (ICD-10-CM) - Activity involving muscle strengthening exercises  Spoke with [facility] and advised last OV note was sent and there was no dx code for post nasal drip. Leopoldo stated that pt needs a more recent note and a different DX code. Called pt to schedule an appt.

## 2024-07-01 NOTE — Telephone Encounter (Signed)
 Copied from CRM #[id number]. Topic: Clinical Concerns - Home Health/Living Facility >> Jul 01, 2024  3:58 PM Isreal GORMAN Louder wrote: Allison Finley, Allison Finley is calling for clinical concerns (Ask: What symptoms are you calling about today, AND how long have you had these symptoms? Must Review HPKW list for symptoms) Document Name of Triage Nurse/BH Rep taking the call when applicable)   Include all details related to the request(s) below: Kaley from [facility] is calling needing more recent visit notes and the reason for the home health needs to be some thing skilled stating it is currently listed as a cough and post nasal drip as the need for home health. They also need to know if the patient needs PT, OT, ect.    Confirm and type the Best Contact Number below:  Patient/caller contact number: [phone]  / fax [phone]          [] Home  [] Mobile  [x] Work [] Other   [x] Okay to leave a voicemail   Medication List:  Current Outpatient Medications:  .  clobetasoL (TEMOVATE) 0.05 % cream, Apply 1 Application topically 2 (two) times a day., Disp: 60 g, Rfl: 0 .  levothyroxine  (SYNTHROID ) 112 mcg tablet, TAKE 1 TABLET EVERY MORNING, Disp: 60 tablet, Rfl: 5 .  omeprazole  (PriLOSEC) 40 mg DR capsule, Take 1 capsule (40 mg total) by mouth in the morning., Disp: 90 capsule, Rfl: 3 .  predniSONE  (DELTASONE ) 10 mg tablet, Take by mouth., Disp: , Rfl:  .  rosuvastatin  (CRESTOR ) 5 mg tablet, TAKE 1 TABLET EVERY DAY, Disp: 90 tablet, Rfl: 3     Medication Request/Refills: Pharmacy Information (if applicable)   [] Not Applicable       []  Pharmacy listed  Send Medication Request to:                                                 [] Pharmacy not listed (added to pharmacy list in Epic) Send Medication Request to:      Listed Pharmacies: [facility] - [place], [place] - [address] - PHONE: [phone] - FAX: [phone] [facility] [address] [place]  - [address] - PHONE: [phone] - FAX: [phone]

## 2024-07-01 NOTE — ED Provider Notes (Incomplete)
 [place] EMERGENCY DEPARTMENT AT [facility] Provider Note   CSN: [id number] Arrival date & time: 07/01/24  1734   Patient presents with: Abdominal Pain   Allison Finley is a 73 y.o. female with past medical history of HLD and recently diagnosed ulcerative colitis c/b iron  deficiency anemia requiring multiple iron  transfusions.  She presents to the ED today for acute on chronic abdominal pain that became severe today while at home.  States that she was resting at home when it began, reports diffuse abdominal pain but notes it is worse in the right upper quadrant.  Has had loose but not watery stools over the last month, previously had bloody stools but has not had any blood in stools for the last week.  Last had an iron  transfusion about 1 week ago and hemoglobin on 7/9 was 8.4.  Felt a few bouts of nausea today but no vomiting.  Patient previously of mesalamine but did not tolerate, is currently on daily prednisone  for her UC. Per daughter in the room, patient's blood pressure tends to be on the low side of normal but is usually 90/50 since recently diagnosed UC.  Patient daughter also notes that patient's skin is very pale starting today.  While being brought into room in the ED today, patient suddenly felt as if she was going to pass out, but denies prior feelings of presyncope or syncope.    Prior to Admission medications   Medication Sig Start Date End Date Taking? Authorizing Provider  levothyroxine  (SYNTHROID ) 112 MCG tablet Take 112 mcg by mouth daily.    [provider]  Multiple Vitamin (MULTIVITAMIN WITH MINERALS) TABS tablet Take 1 tablet by mouth daily.    [provider]  omeprazole  (PRILOSEC) 20 MG capsule Take 1 capsule (20 mg total) by mouth daily. 08/03/21   Theadore Ozell HERO, MD  predniSONE  (DELTASONE ) 10 MG tablet Take 4 tablets (40 mg total) by mouth daily for 10 days, THEN 3 tablets (30 mg total) daily for 10 days, THEN 2 tablets (20 mg  total) daily for 10 days, THEN 1 tablet (10 mg total) daily for 10 days. Patient taking differently: 40 mg daily  05/25/24 07/04/24  Tawkaliyar, Roya, DO  rosuvastatin  (CRESTOR ) 5 MG tablet Take 5 mg by mouth daily.    [provider]    Allergies: Beef-derived drug products, Chicken allergy, and Pork-derived products     Updated Vital Signs BP 107/63 (BP Location: Left Arm)   Pulse 81   Temp 97.7 F (36.5 C)   Resp (!) 25   Ht 5' 2 (1.575 m)   Wt 59 kg   LMP  (LMP Unknown)   SpO2 94%   BMI 23.78 kg/m   Physical Exam Vitals reviewed.  Constitutional:      General: She is not in acute distress.    Appearance: She is underweight. She is ill-appearing. She is not diaphoretic.  HENT:     Head: Normocephalic and atraumatic.     Nose: Nose normal. No rhinorrhea.     Mouth/Throat:     Pharynx: Oropharynx is clear.     Comments: Tacky mucous membranes Eyes:     General: No scleral icterus.    Extraocular Movements: Extraocular movements intact.     Pupils: Pupils are equal, round, and reactive to light.     Comments: Pallor of the eyelids  Cardiovascular:     Rate and Rhythm: Normal rate and regular rhythm.     Pulses: Normal  pulses.     Heart sounds: No murmur heard.    No gallop.     Comments: Trace non-pitting b/l LE edema Pulmonary:     Effort: Pulmonary effort is normal. No respiratory distress.     Breath sounds: Normal breath sounds.  Chest:     Chest wall: No tenderness.  Abdominal:     General: Abdomen is flat. There is no distension.     Palpations: Abdomen is soft.     Tenderness: There is abdominal tenderness (diffuse, worse in RUQ). There is guarding (voluntary when RUQ palpated). There is no rebound.  Musculoskeletal:        General: No deformity.     Cervical back: Normal range of motion and neck supple. No tenderness.  Skin:    General: Skin is warm and dry.     Capillary Refill: Capillary refill takes 2 to 3 seconds.     Coloration: Skin is  pale.     Comments: Questionable mild jaundice  Neurological:     General: No focal deficit present.     Mental Status: She is alert and oriented to person, place, and time.     (all labs ordered are listed, but only abnormal results are displayed) Labs Reviewed  COMPREHENSIVE METABOLIC PANEL WITH GFR - Abnormal; Notable for the following components:      Result Value   Sodium 126 (*)    Chloride 94 (*)    Glucose, Bld 200 (*)    Calcium  8.3 (*)    Total Protein 5.4 (*)    Albumin  1.9 (*)    All other components within normal limits  CBC WITH DIFFERENTIAL/PLATELET - Abnormal; Notable for the following components:   WBC 11.9 (*)    RBC 3.05 (*)    Hemoglobin 8.4 (*)    HCT 27.4 (*)    RDW 18.0 (*)    Neutro Abs 9.4 (*)    Monocytes Absolute 1.5 (*)    All other components within normal limits  PROTIME-INR - Abnormal; Notable for the following components:   Prothrombin Time 16.1 (*)    All other components within normal limits  URINALYSIS, W/ REFLEX TO CULTURE (INFECTION SUSPECTED) - Abnormal; Notable for the following components:   Color, Urine AMBER (*)    APPearance CLOUDY (*)    Specific Gravity, Urine >1.046 (*)    Hgb urine dipstick MODERATE (*)    Ketones, ur 5 (*)    Protein, ur 30 (*)    Leukocytes,Ua MODERATE (*)    Bacteria, UA MANY (*)    All other components within normal limits  I-STAT CG4 LACTIC ACID, ED - Abnormal; Notable for the following components:   Lactic Acid, Venous 2.4 (*)    All other components within normal limits  I-STAT CG4 LACTIC ACID, ED - Abnormal; Notable for the following components:   Lactic Acid, Venous 2.6 (*)    All other components within normal limits  CULTURE, BLOOD (ROUTINE X 2)  CULTURE, BLOOD (ROUTINE X 2)  TYPE AND SCREEN  PREPARE RBC (CROSSMATCH)    EKG: EKG Interpretation Date/Time:  Friday July 01 2024 17:51:48 EDT Ventricular Rate:  88 PR Interval:  114 QRS Duration:  76 QT Interval:  346 QTC  Calculation: 418 R Axis:   62  Text Interpretation: Normal sinus rhythm Nonspecific ST abnormality Abnormal ECG When compared with ECG of 02-Aug-2021 23:41, PREVIOUS ECG IS PRESENT Confirmed by Cleotilde Rogue ([id number]) on 07/01/2024 5:56:04 PM  Radiology: CT ABDOMEN PELVIS  W CONTRAST Addendum Date: 07/01/2024 ADDENDUM REPORT: 07/01/2024 21:23 ADDENDUM: The original report was by Dr. Ryan Salvage. The following addendum is by Dr. Ryan Salvage: Critical Value/emergent results were called by telephone at the time of interpretation on 07/01/2024 at 9:15 pm to provider Dr. Garrick, who verbally acknowledged these results. Electronically Signed   By: Ryan Salvage M.D.   On: 07/01/2024 21:23   Result Date: 07/01/2024 CLINICAL DATA:  Acute abdominal pain. History of ulcerative colitis and irritable bowel syndrome EXAM: CT ABDOMEN AND PELVIS WITH CONTRAST TECHNIQUE: Multidetector CT imaging of the abdomen and pelvis was performed using the standard protocol following bolus administration of intravenous contrast. RADIATION DOSE REDUCTION: This exam was performed according to the departmental dose-optimization program which includes automated exposure control, adjustment of the mA and/or kV according to patient size and/or use of iterative reconstruction technique. CONTRAST:  60mL OMNIPAQUE  IOHEXOL  350 MG/ML SOLN COMPARISON:  05/19/2024 FINDINGS: Lower chest: Scarring and pleural-based nodularity posteriorly in the right lower lobe is not changed from 08/02/2021 and is considered benign. Likewise there is chronic scarring in the lingula. Hepatobiliary: Contracted gallbladder. Gas along the porta hepatis and falciform ligament is favored to be extraluminal intraperitoneal gas rather than pneumobilia. No biliary dilatation. Pancreas: Notable pancreatic atrophy aside from the pancreatic head. Spleen: Unremarkable Adrenals/Urinary Tract: Substantial cystocele noted with pelvic floor laxity. The kidneys and  adrenal glands appear unremarkable. Stomach/Bowel: Large anterior pyloric ulcer on image 29 series 3 with a small amount of nearby extraluminal gas along the falciform ligament and porta hepatis suspicious for early perforation of a pyloric channel ulcer. Surgical consultation recommended. There is abnormal wall thickening throughout the colon the exception of the cecum, compatible with nonspecific colitis. No pneumatosis of the colon is currently identified. Vascular/Lymphatic: Atherosclerosis is present, including aortoiliac atherosclerotic disease. No findings of occlusion of the celiac trunk, SMA, or IMA. Reproductive: Uterus not visualized, suspected vaginal prolapse. Other: Abnormal ascites particularly in the perihepatic region and right paracolic gutter. Trace edema in the omentum diffusely. Musculoskeletal: Right upper abdominal paracentral hernia containing adipose tissue and edema shown on image 30 series 3, the hernia extends between the oblique musculature and transverse abdominis. Lumbar spondylosis and degenerative disc disease causing left foraminal impingement at L4-5 and L5-S1. IMPRESSION: 1. Large anterior pyloric channel ulcer with a small amount of nearby intraperitoneal gas along the falciform ligament and porta hepatis suspicious for early perforation of a pyloric channel ulcer. Ascites noted eccentric to the right. Surgical consultation recommended. 2. Abnormal wall thickening throughout the colon the exception of the cecum, compatible with nonspecific colitis. No pneumatosis of the colon is currently identified. 3. Abnormal ascites particularly in the perihepatic region and right paracolic gutter. Trace edema in the omentum diffusely. 4. Right upper abdominal paracentral hernia containing adipose tissue and edema in between the transverse abdominus and oblique musculature. 5. Substantial cystocele with pelvic floor laxity. Likely vaginal prolapse. Low position of the anorectal junction. 6.  Lumbar spondylosis and degenerative disc disease causing left foraminal impingement at L4-5 and L5-S1. 7.  Aortic Atherosclerosis (ICD10-I70.0). Radiology assistant personnel have been notified to put me in telephone contact with the referring physician or the referring physician's clinical representative in order to discuss these findings. Once this communication is established I will issue an addendum to this report for documentation purposes. Electronically Signed: By: Ryan Salvage M.D. On: 07/01/2024 21:08   DG Chest Portable 1 View Result Date: 07/01/2024 CLINICAL DATA:  Sepsis. EXAM: PORTABLE CHEST 1 VIEW COMPARISON:  None Available. FINDINGS: The heart size and mediastinal contours are within normal limits. Right lung is clear. Minimal left basilar subsegmental atelectasis is noted. The visualized skeletal structures are unremarkable. IMPRESSION: Minimal left basilar subsegmental atelectasis. Electronically Signed   By: Lynwood Landy Raddle M.D.   On: 07/01/2024 18:45     Medications Ordered in the ED  HYDROmorphone  (DILAUDID ) injection 0.25-0.5 mg (0.25 mg Intravenous Given 07/02/24 0128)  ceFAZolin  (ANCEF ) 2-4 GM/100ML-% IVPB (has no administration in time range)  0.9 %  sodium chloride  infusion (Manually program via Guardrails IV Fluids) (has no administration in time range)  naloxone  (NARCAN ) injection 0.4 mg (has no administration in time range)    And  sodium chloride  flush (NS) 0.9 % injection 9 mL (has no administration in time range)  ondansetron  (ZOFRAN ) injection 4 mg (has no administration in time range)  diphenhydrAMINE  (BENADRYL ) injection 12.5 mg (has no administration in time range)    Or  diphenhydrAMINE  (BENADRYL ) 12.5 MG/5ML elixir 12.5 mg (has no administration in time range)  HYDROmorphone  (DILAUDID ) 1 mg/mL PCA injection (has no administration in time range)  piperacillin -tazobactam (ZOSYN ) IVPB 3.375 g (has no administration in time range)  sodium chloride  0.9 %  bolus 1,000 mL (0 mLs Intravenous Stopped 07/01/24 1948)  fentaNYL  (SUBLIMAZE ) injection 25 mcg (25 mcg Intravenous Given 07/01/24 1852)  iohexol  (OMNIPAQUE ) 350 MG/ML injection 60 mL (60 mLs Intravenous Contrast Given 07/01/24 2046)  fentaNYL  (SUBLIMAZE ) injection 50 mcg (50 mcg Intravenous Given 07/01/24 2126)  acetaminophen  (OFIRMEV ) 10 MG/ML IV (  Override pull for Anesthesia 07/02/24 0009)    Clinical Course as of 07/02/24 0146  Fri Jul 01, 2024  1924 Lactic Acid, Venous(!!): 2.4 [AD]  1924 CBC with Differential(!) Hb 8.4 (unchanged from recent prior), Hct 27.4, WBC 11.9 (decreased from prior) [AD]    Clinical Course User Index [AD] Raoul Rake, MD   Medical Decision Making Patient with the above history presenting with 1 day of worsening severe abdominal pain, decreased appetite, and nausea with no vomiting in setting of recently diagnosed UC. She is pale and ill-appearing with hypotension to 70s/40s initially, improved after initial fluid resuscitation. She has diffuse abdominal tenderness on exam with some guarding in the RUQ but otherwise does not appear peritonitic.  Differentials include acute anemia, sepsis, UC flare, bowel perforation, bowel obstruction, cholelithiasis/cholecystitis, UTI, kidney stone  Will get basic labs, lactic, blood cultures, UA, EKG, and CT AP.   As below, CT AP concerning for acute early gastric perforation. Patient made NPO (had previously not been eating in ED) and general surgery consulted, will plan to evaluate patient and likely take to surgery tonight. They requested consulting medicine for admission given patient being on steroids, so hospitalist team was engaged and stated that it would be more appropriate for their team to consult rather than be primary. Surgery team updated. Patient was then promptly taken to the OR from the ED in overall stable condition for ex lap.   Amount and/or Complexity of Data Reviewed Labs: ordered. Decision-making  details documented in ED Course.    Details: Initial lactic 2.4 -> 2.6 after fluids, other labs documented in ED course. UA resulted c/f UTI just as patient was being evaluated by surgery team and was promptly taken to OR, did not initiate abx.  Radiology: ordered.    Details: CT AP findings detailed above but most concerning for acute early gastric perforation with nearby large gastric ulcer ECG/medicine tests: ordered.    Details: Interpretation as above  Risk Prescription drug management. Decision regarding hospitalization. Emergency major surgery.     Final diagnoses:  Gastric perforation, acute  Ulcerative rectosigmoiditis with complication [facility])    ED Discharge Orders     None          Raoul Rake, MD 07/02/24 [id number]    Garrick [place], MD 07/02/24 1704

## 2024-07-01 NOTE — ED Triage Notes (Signed)
 Pt to ED c/o abdominal pain intermittent in nature x 1 month, progressively getting worse. Hx ulcerative colitis, IBS. Last BM today, Denies N/V.

## 2024-07-01 NOTE — Progress Notes (Signed)
 3 rings removed in preop and given to daughter Mliss.

## 2024-07-01 NOTE — Consult Note (Addendum)
 CC/Reason for consult: Perforated viscus  Requesting physician: Isaiah Minion, MD  HPI: Allison Finley is an 73 y.o. female recently diagnosed with UC and is currently taking steroids, currently 40 mg prednisone  per day, presented to the emergency department with complaints of severe abdominal pain.  She noted that this really began today.  She does have some intermittent chronic abdominal pain that has been going on for some time.  This pain however has been in her mid epigastrium.  It is also in her right upper quadrant.  She describes it as being more diffuse however.  Does report nausea but no emesis.  No fever or chills.  Flex sig 04/2024 Dr. Legrand - - Diffuse severe inflammation was found in the rectum and in the distal sigmoid colon secondary to colitis. Biopsied.   - Active chronic colitis, consistent with inflammatory bowel disease.  - Negative for dysplasia and malignancy.  - No viral cytopathic change or basal crypt epithelial apoptosis  identified   No hx of recent NSAID use including ibuprofen/motrin/advil, ASA, naproxen/alleve  Her daughter Allison Finley is present at bedside and is a Designer, jewellery   Past Medical History:  Diagnosis Date   Inflammatory bowel disease (ulcerative colitis) (HCC) 05/20/2024    Past Surgical History:  Procedure Laterality Date   BONE BIOPSY  05/21/2024   Procedure: BIOPSY, GI;  Surgeon: Legrand Victory LITTIE DOUGLAS, MD;  Location: [facility];  Service: Gastroenterology;;   ENID SIGMOIDOSCOPY N/A 05/21/2024   Procedure: K[place]STON ENID;  Surgeon: Legrand Victory LITTIE DOUGLAS, MD;  Location: [facility];  Service: Gastroenterology;  Laterality: N/A;    History reviewed. No pertinent family history.  Social:  reports that she has never smoked. She has never used smokeless tobacco. She reports that she does not drink alcohol and does not use drugs.  Allergies:  Allergies  Allergen Reactions   Beef-Derived Drug Products     Chicken Allergy    Pork-Derived Products Other (See Comments)    vegetarian    Medications: I have reviewed the patient's current medications.  Results for orders placed or performed during the hospital encounter of 07/01/24 (from the past 48 hours)  Comprehensive metabolic panel     Status: Abnormal   Collection Time: 07/01/24  6:21 PM  Result Value Ref Range   Sodium 126 (L) 135 - 145 mmol/L   Potassium 4.0 3.5 - 5.1 mmol/L   Chloride 94 (L) 98 - 111 mmol/L   CO2 26 22 - 32 mmol/L   Glucose, Bld 200 (H) 70 - 99 mg/dL    Comment: Glucose reference range applies only to samples taken after fasting for at least 8 hours.   BUN 23 8 - 23 mg/dL   Creatinine, Ser 9.48 0.44 - 1.00 mg/dL   Calcium  8.3 (L) 8.9 - 10.3 mg/dL   Total Protein 5.4 (L) 6.5 - 8.1 g/dL   Albumin  1.9 (L) 3.5 - 5.0 g/dL   AST 18 15 - 41 U/L   ALT 22 0 - 44 U/L   Alkaline Phosphatase 66 38 - 126 U/L   Total Bilirubin 0.4 0.0 - 1.2 mg/dL   GFR, Estimated >39 >39 mL/min    Comment: (NOTE) Calculated using the CKD-EPI Creatinine Equation (2021)    Anion gap 6 5 - 15    Comment: Performed at [facility], [address] [place], [place] [address]  CBC with Differential     Status: Abnormal   Collection Time: 07/01/24  6:21 PM  Result Value Ref Range   WBC 11.9 (H) 4.0 - 10.5 K/uL   RBC 3.05 (L) 3.87 - 5.11 MIL/uL   Hemoglobin 8.4 (L) 12.0 - 15.0 g/dL   HCT 72.5 (L) 63.9 - 53.9 %   MCV 89.8 80.0 - 100.0 fL   MCH 27.5 26.0 - 34.0 pg   MCHC 30.7 30.0 - 36.0 g/dL   RDW 81.9 (H) 88.4 - 84.4 %   Platelets 281 150 - 400 K/uL   nRBC 0.2 0.0 - 0.2 %   Neutrophils Relative % 79 %   Neutro Abs 9.4 (H) 1.7 - 7.7 K/uL   Lymphocytes Relative 8 %   Lymphs Abs 1.0 0.7 - 4.0 K/uL   Monocytes Relative 13 %   Monocytes Absolute 1.5 (H) 0.1 - 1.0 K/uL   Eosinophils Relative 0 %   Eosinophils Absolute 0.0 0.0 - 0.5 K/uL   Basophils Relative 0 %   Basophils Absolute 0.0 0.0 - 0.1 K/uL   WBC Morphology See Note      Comment: Increased Bands. >20% Bands  Mild Left Shift. 1 to 5% Metas, occ myelo    RBC Morphology See Note     Comment: Morphology unremarkable   nRBC 0 0 /100 WBC   Abs Immature Granulocytes 0.00 0.00 - 0.07 K/uL    Comment: Performed at [facility], [address] [place], [place] [address]  Protime-INR     Status: Abnormal   Collection Time: 07/01/24  6:21 PM  Result Value Ref Range   Prothrombin Time 16.1 (H) 11.4 - 15.2 seconds   INR 1.2 0.8 - 1.2    Comment: (NOTE) INR goal varies based on device and disease states. Performed at [facility], [address] [place], [place] [address]   I-Stat Lactic Acid, ED     Status: Abnormal   Collection Time: 07/01/24  6:41 PM  Result Value Ref Range   Lactic Acid, Venous 2.4 (HH) 0.5 - 1.9 mmol/L   Comment NOTIFIED PHYSICIAN   I-Stat Lactic Acid, ED     Status: Abnormal   Collection Time: 07/01/24  8:09 PM  Result Value Ref Range   Lactic Acid, Venous 2.6 (HH) 0.5 - 1.9 mmol/L   Comment NOTIFIED PHYSICIAN   Urinalysis, w/ Reflex to Culture (Infection Suspected) -Urine, Clean Catch     Status: Abnormal   Collection Time: 07/01/24  9:46 PM  Result Value Ref Range   Specimen Source URINE, CATHETERIZED    Color, Urine AMBER (A) YELLOW    Comment: BIOCHEMICALS MAY BE AFFECTED BY COLOR   APPearance CLOUDY (A) CLEAR   Specific Gravity, Urine >1.046 (H) 1.005 - 1.030   pH 5.0 5.0 - 8.0   Glucose, UA NEGATIVE NEGATIVE mg/dL   Hgb urine dipstick MODERATE (A) NEGATIVE   Bilirubin Urine NEGATIVE NEGATIVE   Ketones, ur 5 (A) NEGATIVE mg/dL   Protein, ur 30 (A) NEGATIVE mg/dL   Nitrite NEGATIVE NEGATIVE   Leukocytes,Ua MODERATE (A) NEGATIVE   RBC / HPF 21-50 0 - 5 RBC/hpf   WBC, UA 6-10 0 - 5 WBC/hpf    Comment:        Reflex urine culture not performed if WBC <=10, OR if Squamous epithelial cells >5. If Squamous epithelial cells >5 suggest recollection.    Bacteria, UA MANY (A) NONE SEEN   Squamous Epithelial / HPF  21-50 0 - 5 /HPF   Mucus PRESENT     Comment: Performed at [facility]  [facility], [address] [place], [place] [address]    CT ABDOMEN PELVIS W CONTRAST Addendum Date: 07/01/2024 ADDENDUM REPORT: 07/01/2024 21:23 ADDENDUM: The original report was by Dr. Ryan Salvage. The following addendum is by Dr. Ryan Salvage: Critical Value/emergent results were called by telephone at the time of interpretation on 07/01/2024 at 9:15 pm to provider Dr. Garrick, who verbally acknowledged these results. Electronically Signed   By: Ryan Salvage M.D.   On: 07/01/2024 21:23   Result Date: 07/01/2024 CLINICAL DATA:  Acute abdominal pain. History of ulcerative colitis and irritable bowel syndrome EXAM: CT ABDOMEN AND PELVIS WITH CONTRAST TECHNIQUE: Multidetector CT imaging of the abdomen and pelvis was performed using the standard protocol following bolus administration of intravenous contrast. RADIATION DOSE REDUCTION: This exam was performed according to the departmental dose-optimization program which includes automated exposure control, adjustment of the mA and/or kV according to patient size and/or use of iterative reconstruction technique. CONTRAST:  60mL OMNIPAQUE  IOHEXOL  350 MG/ML SOLN COMPARISON:  05/19/2024 FINDINGS: Lower chest: Scarring and pleural-based nodularity posteriorly in the right lower lobe is not changed from 08/02/2021 and is considered benign. Likewise there is chronic scarring in the lingula. Hepatobiliary: Contracted gallbladder. Gas along the porta hepatis and falciform ligament is favored to be extraluminal intraperitoneal gas rather than pneumobilia. No biliary dilatation. Pancreas: Notable pancreatic atrophy aside from the pancreatic head. Spleen: Unremarkable Adrenals/Urinary Tract: Substantial cystocele noted with pelvic floor laxity. The kidneys and adrenal glands appear unremarkable. Stomach/Bowel: Large anterior pyloric ulcer on image 29 series 3 with a small amount of nearby  extraluminal gas along the falciform ligament and porta hepatis suspicious for early perforation of a pyloric channel ulcer. Surgical consultation recommended. There is abnormal wall thickening throughout the colon the exception of the cecum, compatible with nonspecific colitis. No pneumatosis of the colon is currently identified. Vascular/Lymphatic: Atherosclerosis is present, including aortoiliac atherosclerotic disease. No findings of occlusion of the celiac trunk, SMA, or IMA. Reproductive: Uterus not visualized, suspected vaginal prolapse. Other: Abnormal ascites particularly in the perihepatic region and right paracolic gutter. Trace edema in the omentum diffusely. Musculoskeletal: Right upper abdominal paracentral hernia containing adipose tissue and edema shown on image 30 series 3, the hernia extends between the oblique musculature and transverse abdominis. Lumbar spondylosis and degenerative disc disease causing left foraminal impingement at L4-5 and L5-S1. IMPRESSION: 1. Large anterior pyloric channel ulcer with a small amount of nearby intraperitoneal gas along the falciform ligament and porta hepatis suspicious for early perforation of a pyloric channel ulcer. Ascites noted eccentric to the right. Surgical consultation recommended. 2. Abnormal wall thickening throughout the colon the exception of the cecum, compatible with nonspecific colitis. No pneumatosis of the colon is currently identified. 3. Abnormal ascites particularly in the perihepatic region and right paracolic gutter. Trace edema in the omentum diffusely. 4. Right upper abdominal paracentral hernia containing adipose tissue and edema in between the transverse abdominus and oblique musculature. 5. Substantial cystocele with pelvic floor laxity. Likely vaginal prolapse. Low position of the anorectal junction. 6. Lumbar spondylosis and degenerative disc disease causing left foraminal impingement at L4-5 and L5-S1. 7.  Aortic Atherosclerosis  (ICD10-I70.0). Radiology assistant personnel have been notified to put me in telephone contact with the referring physician or the referring physician's clinical representative in order to discuss these findings. Once this communication is established I will issue an addendum to this report for documentation purposes. Electronically Signed: By: Ryan Salvage M.D. On: 07/01/2024 21:08   DG Chest Portable 1 View  Result Date: 07/01/2024 CLINICAL DATA:  Sepsis. EXAM: PORTABLE CHEST 1 VIEW COMPARISON:  None Available. FINDINGS: The heart size and mediastinal contours are within normal limits. Right lung is clear. Minimal left basilar subsegmental atelectasis is noted. The visualized skeletal structures are unremarkable. IMPRESSION: Minimal left basilar subsegmental atelectasis. Electronically Signed   By: Lynwood Landy Raddle M.D.   On: 07/01/2024 18:45    ROS - all of the below systems have been reviewed with the patient and positives are indicated with bold text General: chills, fever or night sweats Eyes: blurry vision or double vision ENT: epistaxis or sore throat Allergy/Immunology: itchy/watery eyes or nasal congestion Hematologic/Lymphatic: bleeding problems, blood clots or swollen lymph nodes Endocrine: temperature intolerance or unexpected weight changes Breast: new or changing breast lumps or nipple discharge Resp: cough, shortness of breath, or wheezing CV: chest pain or dyspnea on exertion GI: as per HPI GU: dysuria, trouble voiding, or hematuria MSK: joint pain or joint stiffness Neuro: TIA or stroke symptoms Derm: pruritus and skin lesion changes Psych: anxiety and depression  PE Blood pressure 104/65, pulse 83, temperature 97.7 F (36.5 C), temperature source Oral, resp. rate 18, height 5' 2 (1.575 m), weight 59 kg, SpO2 100%. Constitutional: NAD; conversant; no deformities; pale; uncomfortable Eyes: Moist conjunctiva; no lid lag; anicteric; PERRL Neck: Trachea midline; no  thyromegaly Lungs: Normal respiratory effort; no tactile fremitus CV: RRR; no palpable thrills; no pitting edema GI: Abd soft but diffusely tender, involuntary guarding; not significantly distended; no palpable hepatosplenomegaly MSK: Normal range of motion of extremities; no clubbing/cyanosis Psychiatric: Appropriate affect; alert and oriented x3 Lymphatic: No palpable cervical or axillary lymphadenopathy  Results for orders placed or performed during the hospital encounter of 07/01/24 (from the past 48 hours)  Comprehensive metabolic panel     Status: Abnormal   Collection Time: 07/01/24  6:21 PM  Result Value Ref Range   Sodium 126 (L) 135 - 145 mmol/L   Potassium 4.0 3.5 - 5.1 mmol/L   Chloride 94 (L) 98 - 111 mmol/L   CO2 26 22 - 32 mmol/L   Glucose, Bld 200 (H) 70 - 99 mg/dL    Comment: Glucose reference range applies only to samples taken after fasting for at least 8 hours.   BUN 23 8 - 23 mg/dL   Creatinine, Ser 9.48 0.44 - 1.00 mg/dL   Calcium  8.3 (L) 8.9 - 10.3 mg/dL   Total Protein 5.4 (L) 6.5 - 8.1 g/dL   Albumin  1.9 (L) 3.5 - 5.0 g/dL   AST 18 15 - 41 U/L   ALT 22 0 - 44 U/L   Alkaline Phosphatase 66 38 - 126 U/L   Total Bilirubin 0.4 0.0 - 1.2 mg/dL   GFR, Estimated >39 >39 mL/min    Comment: (NOTE) Calculated using the CKD-EPI Creatinine Equation (2021)    Anion gap 6 5 - 15    Comment: Performed at [facility], [address], [place], [place] [address]  CBC with Differential     Status: Abnormal   Collection Time: 07/01/24  6:21 PM  Result Value Ref Range   WBC 11.9 (H) 4.0 - 10.5 K/uL   RBC 3.05 (L) 3.87 - 5.11 MIL/uL   Hemoglobin 8.4 (L) 12.0 - 15.0 g/dL   HCT 72.5 (L) 63.9 - 53.9 %   MCV 89.8 80.0 - 100.0 fL   MCH 27.5 26.0 - 34.0 pg   MCHC 30.7 30.0 - 36.0 g/dL   RDW 81.9 (H) 88.4 -  15.5 %   Platelets 281 150 - 400 K/uL   nRBC 0.2 0.0 - 0.2 %   Neutrophils Relative % 79 %   Neutro Abs 9.4 (H) 1.7 - 7.7 K/uL   Lymphocytes Relative 8 %    Lymphs Abs 1.0 0.7 - 4.0 K/uL   Monocytes Relative 13 %   Monocytes Absolute 1.5 (H) 0.1 - 1.0 K/uL   Eosinophils Relative 0 %   Eosinophils Absolute 0.0 0.0 - 0.5 K/uL   Basophils Relative 0 %   Basophils Absolute 0.0 0.0 - 0.1 K/uL   WBC Morphology See Note     Comment: Increased Bands. >20% Bands  Mild Left Shift. 1 to 5% Metas, occ myelo    RBC Morphology See Note     Comment: Morphology unremarkable   nRBC 0 0 /100 WBC   Abs Immature Granulocytes 0.00 0.00 - 0.07 K/uL    Comment: Performed at [facility], [address], [place], [place] [address]  Protime-INR     Status: Abnormal   Collection Time: 07/01/24  6:21 PM  Result Value Ref Range   Prothrombin Time 16.1 (H) 11.4 - 15.2 seconds   INR 1.2 0.8 - 1.2    Comment: (NOTE) INR goal varies based on device and disease states. Performed at [facility], [address] [place], [place] [address]   I-Stat Lactic Acid, ED     Status: Abnormal   Collection Time: 07/01/24  6:41 PM  Result Value Ref Range   Lactic Acid, Venous 2.4 (HH) 0.5 - 1.9 mmol/L   Comment NOTIFIED PHYSICIAN   I-Stat Lactic Acid, ED     Status: Abnormal   Collection Time: 07/01/24  8:09 PM  Result Value Ref Range   Lactic Acid, Venous 2.6 (HH) 0.5 - 1.9 mmol/L   Comment NOTIFIED PHYSICIAN   Urinalysis, w/ Reflex to Culture (Infection Suspected) -Urine, Clean Catch     Status: Abnormal   Collection Time: 07/01/24  9:46 PM  Result Value Ref Range   Specimen Source URINE, CATHETERIZED    Color, Urine AMBER (A) YELLOW    Comment: BIOCHEMICALS MAY BE AFFECTED BY COLOR   APPearance CLOUDY (A) CLEAR   Specific Gravity, Urine >1.046 (H) 1.005 - 1.030   pH 5.0 5.0 - 8.0   Glucose, UA NEGATIVE NEGATIVE mg/dL   Hgb urine dipstick MODERATE (A) NEGATIVE   Bilirubin Urine NEGATIVE NEGATIVE   Ketones, ur 5 (A) NEGATIVE mg/dL   Protein, ur 30 (A) NEGATIVE mg/dL   Nitrite NEGATIVE NEGATIVE   Leukocytes,Ua MODERATE (A) NEGATIVE   RBC / HPF 21-50 0  - 5 RBC/hpf   WBC, UA 6-10 0 - 5 WBC/hpf    Comment:        Reflex urine culture not performed if WBC <=10, OR if Squamous epithelial cells >5. If Squamous epithelial cells >5 suggest recollection.    Bacteria, UA MANY (A) NONE SEEN   Squamous Epithelial / HPF 21-50 0 - 5 /HPF   Mucus PRESENT     Comment: Performed at [facility], [address] [place], [place] [address]    CT ABDOMEN PELVIS W CONTRAST Addendum Date: 07/01/2024 ADDENDUM REPORT: 07/01/2024 21:23 ADDENDUM: The original report was by Dr. Ryan Salvage. The following addendum is by Dr. Ryan Salvage: Critical Value/emergent results were called by telephone at the time of interpretation on 07/01/2024 at 9:15 pm to provider Dr. Garrick, who verbally acknowledged these results. Electronically Signed   By:  Ryan Salvage M.D.   On: 07/01/2024 21:23   Result Date: 07/01/2024 CLINICAL DATA:  Acute abdominal pain. History of ulcerative colitis and irritable bowel syndrome EXAM: CT ABDOMEN AND PELVIS WITH CONTRAST TECHNIQUE: Multidetector CT imaging of the abdomen and pelvis was performed using the standard protocol following bolus administration of intravenous contrast. RADIATION DOSE REDUCTION: This exam was performed according to the departmental dose-optimization program which includes automated exposure control, adjustment of the mA and/or kV according to patient size and/or use of iterative reconstruction technique. CONTRAST:  60mL OMNIPAQUE  IOHEXOL  350 MG/ML SOLN COMPARISON:  05/19/2024 FINDINGS: Lower chest: Scarring and pleural-based nodularity posteriorly in the right lower lobe is not changed from 08/02/2021 and is considered benign. Likewise there is chronic scarring in the lingula. Hepatobiliary: Contracted gallbladder. Gas along the porta hepatis and falciform ligament is favored to be extraluminal intraperitoneal gas rather than pneumobilia. No biliary dilatation. Pancreas: Notable pancreatic atrophy aside from  the pancreatic head. Spleen: Unremarkable Adrenals/Urinary Tract: Substantial cystocele noted with pelvic floor laxity. The kidneys and adrenal glands appear unremarkable. Stomach/Bowel: Large anterior pyloric ulcer on image 29 series 3 with a small amount of nearby extraluminal gas along the falciform ligament and porta hepatis suspicious for early perforation of a pyloric channel ulcer. Surgical consultation recommended. There is abnormal wall thickening throughout the colon the exception of the cecum, compatible with nonspecific colitis. No pneumatosis of the colon is currently identified. Vascular/Lymphatic: Atherosclerosis is present, including aortoiliac atherosclerotic disease. No findings of occlusion of the celiac trunk, SMA, or IMA. Reproductive: Uterus not visualized, suspected vaginal prolapse. Other: Abnormal ascites particularly in the perihepatic region and right paracolic gutter. Trace edema in the omentum diffusely. Musculoskeletal: Right upper abdominal paracentral hernia containing adipose tissue and edema shown on image 30 series 3, the hernia extends between the oblique musculature and transverse abdominis. Lumbar spondylosis and degenerative disc disease causing left foraminal impingement at L4-5 and L5-S1. IMPRESSION: 1. Large anterior pyloric channel ulcer with a small amount of nearby intraperitoneal gas along the falciform ligament and porta hepatis suspicious for early perforation of a pyloric channel ulcer. Ascites noted eccentric to the right. Surgical consultation recommended. 2. Abnormal wall thickening throughout the colon the exception of the cecum, compatible with nonspecific colitis. No pneumatosis of the colon is currently identified. 3. Abnormal ascites particularly in the perihepatic region and right paracolic gutter. Trace edema in the omentum diffusely. 4. Right upper abdominal paracentral hernia containing adipose tissue and edema in between the transverse abdominus and  oblique musculature. 5. Substantial cystocele with pelvic floor laxity. Likely vaginal prolapse. Low position of the anorectal junction. 6. Lumbar spondylosis and degenerative disc disease causing left foraminal impingement at L4-5 and L5-S1. 7.  Aortic Atherosclerosis (ICD10-I70.0). Radiology assistant personnel have been notified to put me in telephone contact with the referring physician or the referring physician's clinical representative in order to discuss these findings. Once this communication is established I will issue an addendum to this report for documentation purposes. Electronically Signed: By: Ryan Salvage M.D. On: 07/01/2024 21:08   DG Chest Portable 1 View Result Date: 07/01/2024 CLINICAL DATA:  Sepsis. EXAM: PORTABLE CHEST 1 VIEW COMPARISON:  None Available. FINDINGS: The heart size and mediastinal contours are within normal limits. Right lung is clear. Minimal left basilar subsegmental atelectasis is noted. The visualized skeletal structures are unremarkable. IMPRESSION: Minimal left basilar subsegmental atelectasis. Electronically Signed   By: Lynwood Landy Raddle M.D.   On: 07/01/2024 18:45    A/P: Allison Finley is an 73 y.o. female with HLD, recently diagnosed ulcerative colitis on prednisone  - here with perforated viscus  -The anatomy and physiology of the GI tract was discussed with the patient. The pathophysiology of perforated viscus was discussed as well. -We have discussed surgery, exploratory laparotomy, probable Arlyss chancey chancy; situations or other findings may alter plans including potential for ostomy in these cases.  -The planned procedure, material risks (including, but not limited to, pain, bleeding, infection, scarring, need for blood transfusion, damage to surrounding structures- blood vessels/nerves/viscus/organs, damage to ureter, urine leak, leak from anastomosis or repair, need for additional procedures,scenarios where a stoma may be necessary,  worsening of pre-existing medical conditions, chronic diarrhea, constipation secondary to narcotic use, hernia, recurrence, pneumonia, heart attack, stroke, death) benefits and alternatives to surgery were discussed at length. The patient's questions were answered to her satisfaction, she voiced understanding and elected to proceed with surgery. Additionally, we discussed typical postoperative expectations and the recovery process.  I spent a total of 80 minutes in both face-to-face and non-face-to-face activities, excluding procedures performed, for this visit on the date of this encounter.  Lonni Pizza, MD [facility], [facility]

## 2024-07-01 NOTE — Anesthesia Preprocedure Evaluation (Addendum)
 Anesthesia Evaluation  Patient identified by MRN, date of birth, ID band Patient awake    Reviewed: Allergy & Precautions, H&P , NPO status , Patient's Chart, lab work & pertinent test results  Airway Mallampati: II  TM Distance: >3 FB Neck ROM: Full    Dental no notable dental hx. (+) Teeth Intact, Dental Advisory Given   Pulmonary neg pulmonary ROS   Pulmonary exam normal breath sounds clear to auscultation       Cardiovascular negative cardio ROS  Rhythm:Regular Rate:Normal     Neuro/Psych negative neurological ROS  negative psych ROS   GI/Hepatic Neg liver ROS, PUD,,,  Endo/Other  negative endocrine ROS    Renal/GU negative Renal ROS  negative genitourinary   Musculoskeletal   Abdominal   Peds  Hematology  (+) Blood dyscrasia, anemia   Anesthesia Other Findings   Reproductive/Obstetrics negative OB ROS                              Anesthesia Physical Anesthesia Plan  ASA: 2 and emergent  Anesthesia Plan: General   Post-op Pain Management: Ofirmev  IV (intra-op)*   Induction: Intravenous, Rapid sequence and Cricoid pressure planned  PONV Risk Score and Plan: 4 or greater and Ondansetron , Dexamethasone  and Treatment may vary due to age or medical condition  Airway Management Planned: Oral ETT  Additional Equipment:   Intra-op Plan:   Post-operative Plan: Extubation in OR  Informed Consent: I have reviewed the patients History and Physical, chart, labs and discussed the procedure including the risks, benefits and alternatives for the proposed anesthesia with the patient or authorized representative who has indicated his/her understanding and acceptance.     Dental advisory given  Plan Discussed with: CRNA  Anesthesia Plan Comments:          Anesthesia Quick Evaluation

## 2024-07-01 NOTE — Anesthesia Procedure Notes (Signed)
 Procedure Name: Intubation Date/Time: 07/01/2024 11:10 PM  Performed by: Jahrell Hamor C., CRNAPre-anesthesia Checklist: Patient identified, Emergency Drugs available, Suction available, Patient being monitored and Timeout performed Patient Re-evaluated:Patient Re-evaluated prior to induction Oxygen Delivery Method: Circle system utilized Preoxygenation: Pre-oxygenation with 100% oxygen Induction Type: IV induction, Rapid sequence and Cricoid Pressure applied Laryngoscope Size: Mac and 3 Grade View: Grade I Tube size: 7.0 mm Number of attempts: 1 Airway Equipment and Method: Stylet Placement Confirmation: ETT inserted through vocal cords under direct vision, positive ETCO2 and breath sounds checked- equal and bilateral Secured at: 22 cm Tube secured with: Tape Dental Injury: Teeth and Oropharynx as per pre-operative assessment

## 2024-07-01 NOTE — ED Provider Triage Note (Signed)
 Emergency Medicine Provider Triage Evaluation Note  Allison Finley , a 73 y.o. female  was evaluated in triage.  Pt complains of sudden onset abdominal pain.  Hypotensive and slightly tachypneic in triage.  Sepsis labs ordered.  I placed an order for CT scan.  Advised nursing that patient needs a room ASAP.SABRA  Review of Systems  Positive: As above Negative: As above  Physical Exam  BP (!) 76/47 (BP Location: Right Arm)   Pulse 77   Temp 97.6 F (36.4 C)   Resp (!) 22   Ht 5' 2 (1.575 m)   Wt 59 kg   SpO2 91%   BMI 23.78 kg/m  Gen:   Awake, no distress   Resp:  Normal effort  MSK:   Moves extremities without difficulty Other:    Medical Decision Making  Medically screening exam initiated at 5:58 PM.  Appropriate orders placed.  Allison Finley was informed that the remainder of the evaluation will be completed by another provider, this initial triage assessment does not replace that evaluation, and the importance of remaining in the ED until their evaluation is complete.    Hildegard Loge, PA-C 07/01/24 1758

## 2024-07-02 ENCOUNTER — Encounter (HOSPITAL_COMMUNITY): Payer: Self-pay | Admitting: Surgery

## 2024-07-02 ENCOUNTER — Other Ambulatory Visit: Payer: Self-pay

## 2024-07-02 DIAGNOSIS — K255 Chronic or unspecified gastric ulcer with perforation: Secondary | ICD-10-CM | POA: Diagnosis not present

## 2024-07-02 DIAGNOSIS — Z9889 Other specified postprocedural states: Secondary | ICD-10-CM | POA: Diagnosis not present

## 2024-07-02 DIAGNOSIS — R188 Other ascites: Secondary | ICD-10-CM | POA: Diagnosis present

## 2024-07-02 DIAGNOSIS — L89313 Pressure ulcer of right buttock, stage 3: Secondary | ICD-10-CM | POA: Diagnosis present

## 2024-07-02 DIAGNOSIS — K8689 Other specified diseases of pancreas: Secondary | ICD-10-CM | POA: Diagnosis present

## 2024-07-02 DIAGNOSIS — K5100 Ulcerative (chronic) pancolitis without complications: Secondary | ICD-10-CM | POA: Diagnosis not present

## 2024-07-02 DIAGNOSIS — G8929 Other chronic pain: Secondary | ICD-10-CM | POA: Diagnosis present

## 2024-07-02 DIAGNOSIS — K256 Chronic or unspecified gastric ulcer with both hemorrhage and perforation: Secondary | ICD-10-CM | POA: Diagnosis present

## 2024-07-02 DIAGNOSIS — L89152 Pressure ulcer of sacral region, stage 2: Secondary | ICD-10-CM | POA: Diagnosis present

## 2024-07-02 DIAGNOSIS — E43 Unspecified severe protein-calorie malnutrition: Secondary | ICD-10-CM | POA: Diagnosis present

## 2024-07-02 DIAGNOSIS — Z79899 Other long term (current) drug therapy: Secondary | ICD-10-CM | POA: Diagnosis not present

## 2024-07-02 DIAGNOSIS — K51311 Ulcerative (chronic) rectosigmoiditis with rectal bleeding: Secondary | ICD-10-CM | POA: Diagnosis present

## 2024-07-02 DIAGNOSIS — W19XXXA Unspecified fall, initial encounter: Secondary | ICD-10-CM | POA: Diagnosis not present

## 2024-07-02 DIAGNOSIS — A0472 Enterocolitis due to Clostridium difficile, not specified as recurrent: Secondary | ICD-10-CM | POA: Diagnosis not present

## 2024-07-02 DIAGNOSIS — D62 Acute posthemorrhagic anemia: Secondary | ICD-10-CM | POA: Diagnosis not present

## 2024-07-02 DIAGNOSIS — K3189 Other diseases of stomach and duodenum: Principal | ICD-10-CM | POA: Diagnosis present

## 2024-07-02 DIAGNOSIS — E039 Hypothyroidism, unspecified: Secondary | ICD-10-CM

## 2024-07-02 DIAGNOSIS — Z79890 Hormone replacement therapy (postmenopausal): Secondary | ICD-10-CM | POA: Diagnosis not present

## 2024-07-02 DIAGNOSIS — E871 Hypo-osmolality and hyponatremia: Secondary | ICD-10-CM | POA: Diagnosis not present

## 2024-07-02 DIAGNOSIS — R3129 Other microscopic hematuria: Secondary | ICD-10-CM | POA: Diagnosis present

## 2024-07-02 DIAGNOSIS — D638 Anemia in other chronic diseases classified elsewhere: Secondary | ICD-10-CM | POA: Diagnosis present

## 2024-07-02 DIAGNOSIS — R5381 Other malaise: Secondary | ICD-10-CM | POA: Diagnosis not present

## 2024-07-02 DIAGNOSIS — L89323 Pressure ulcer of left buttock, stage 3: Secondary | ICD-10-CM | POA: Diagnosis present

## 2024-07-02 DIAGNOSIS — M51379 Other intervertebral disc degeneration, lumbosacral region without mention of lumbar back pain or lower extremity pain: Secondary | ICD-10-CM | POA: Diagnosis present

## 2024-07-02 DIAGNOSIS — M47816 Spondylosis without myelopathy or radiculopathy, lumbar region: Secondary | ICD-10-CM | POA: Diagnosis present

## 2024-07-02 DIAGNOSIS — M51369 Other intervertebral disc degeneration, lumbar region without mention of lumbar back pain or lower extremity pain: Secondary | ICD-10-CM | POA: Diagnosis present

## 2024-07-02 DIAGNOSIS — N390 Urinary tract infection, site not specified: Secondary | ICD-10-CM

## 2024-07-02 DIAGNOSIS — Z6824 Body mass index (BMI) 24.0-24.9, adult: Secondary | ICD-10-CM | POA: Diagnosis not present

## 2024-07-02 DIAGNOSIS — E785 Hyperlipidemia, unspecified: Secondary | ICD-10-CM | POA: Diagnosis present

## 2024-07-02 LAB — IRON AND TIBC
Iron: 12 ug/dL — ABNORMAL LOW (ref 28–170)
Saturation Ratios: 8 % — ABNORMAL LOW (ref 10.4–31.8)
TIBC: 153 ug/dL — ABNORMAL LOW (ref 250–450)
UIBC: 141 ug/dL

## 2024-07-02 LAB — VITAMIN B12: Vitamin B-12: >7500 pg/mL — ABNORMAL HIGH (ref 180–914)

## 2024-07-02 LAB — COMPREHENSIVE METABOLIC PANEL WITH GFR
ALT: 16 U/L (ref 0–44)
AST: 13 U/L — ABNORMAL LOW (ref 15–41)
Albumin: 1.9 g/dL — ABNORMAL LOW (ref 3.5–5.0)
Alkaline Phosphatase: 43 U/L (ref 38–126)
Anion gap: 9 (ref 5–15)
BUN: 16 mg/dL (ref 8–23)
CO2: 23 mmol/L (ref 22–32)
Calcium: 7.7 mg/dL — ABNORMAL LOW (ref 8.9–10.3)
Chloride: 101 mmol/L (ref 98–111)
Creatinine, Ser: 0.37 mg/dL — ABNORMAL LOW (ref 0.44–1.00)
GFR, Estimated: >60 mL/min (ref >60)
Glucose, Bld: 139 mg/dL — ABNORMAL HIGH (ref 70–99)
Potassium: 3.1 mmol/L — ABNORMAL LOW (ref 3.5–5.1)
Sodium: 133 mmol/L — ABNORMAL LOW (ref 135–145)
Total Bilirubin: 0.8 mg/dL (ref 0.0–1.2)
Total Protein: 4.3 g/dL — ABNORMAL LOW (ref 6.5–8.1)

## 2024-07-02 LAB — RETICULOCYTES
Immature Retic Fract: 30.4 % — ABNORMAL HIGH (ref 2.3–15.9)
RBC.: 3.04 MIL/uL — ABNORMAL LOW (ref 3.87–5.11)
Retic Count, Absolute: 104.6 K/uL (ref 19.0–186.0)
Retic Ct Pct: 3.4 % — ABNORMAL HIGH (ref 0.4–3.1)

## 2024-07-02 LAB — CBC
HCT: 26.6 % — ABNORMAL LOW (ref 36.0–46.0)
Hemoglobin: 8.3 g/dL — ABNORMAL LOW (ref 12.0–15.0)
MCH: 27.6 pg (ref 26.0–34.0)
MCHC: 31.2 g/dL (ref 30.0–36.0)
MCV: 88.4 fL (ref 80.0–100.0)
Platelets: 164 K/uL (ref 150–400)
RBC: 3.01 MIL/uL — ABNORMAL LOW (ref 3.87–5.11)
RDW: 17.9 % — ABNORMAL HIGH (ref 11.5–15.5)
WBC: 10 K/uL (ref 4.0–10.5)
nRBC: 0 % (ref 0.0–0.2)

## 2024-07-02 LAB — FERRITIN: Ferritin: 299 ng/mL (ref 11–307)

## 2024-07-02 LAB — FOLATE: Folate: 19.4 ng/mL (ref >5.9)

## 2024-07-02 MED ORDER — SODIUM CHLORIDE 0.9 % IV SOLN
100.0000 mg | Freq: Every day | INTRAVENOUS | Status: AC
  Administered 2024-07-02 – 2024-07-06 (×5): 100 mg via INTRAVENOUS
  Filled 2024-07-02 (×6): qty 5

## 2024-07-02 MED ORDER — DIPHENHYDRAMINE HCL 50 MG/ML IJ SOLN: 12.5000 mg | Freq: Four times a day (QID) | INTRAMUSCULAR | Status: DC | PRN

## 2024-07-02 MED ORDER — HYDROMORPHONE HCL 1 MG/ML IJ SOLN
0.5000 mg | INTRAMUSCULAR | Status: DC | PRN
  Administered 2024-07-02 – 2024-07-03 (×2): 0.5 mg via INTRAVENOUS
  Filled 2024-07-02 (×2): qty 0.5

## 2024-07-02 MED ORDER — NALOXONE HCL 0.4 MG/ML IJ SOLN: 0.4000 mg | INTRAMUSCULAR | Status: DC | PRN

## 2024-07-02 MED ORDER — HYDROMORPHONE HCL 1 MG/ML IJ SOLN
INTRAMUSCULAR | Status: AC
  Filled 2024-07-02: qty 1

## 2024-07-02 MED ORDER — HYDROCORTISONE SOD SUC (PF) 100 MG IJ SOLR
100.0000 mg | Freq: Three times a day (TID) | INTRAMUSCULAR | Status: DC
  Administered 2024-07-02 – 2024-07-03 (×4): 100 mg via INTRAVENOUS
  Filled 2024-07-02 (×5): qty 2

## 2024-07-02 MED ORDER — POTASSIUM CHLORIDE 10 MEQ/100ML IV SOLN
INTRAVENOUS | Status: AC
  Administered 2024-07-02: 10 meq
  Filled 2024-07-02: qty 100

## 2024-07-02 MED ORDER — DIPHENHYDRAMINE HCL 12.5 MG/5ML PO ELIX: 12.5000 mg | ORAL_SOLUTION | Freq: Four times a day (QID) | ORAL | Status: DC | PRN

## 2024-07-02 MED ORDER — SUGAMMADEX SODIUM 200 MG/2ML IV SOLN
INTRAVENOUS | Status: DC | PRN
  Administered 2024-07-02 (×2): 100 mg via INTRAVENOUS

## 2024-07-02 MED ORDER — 0.9 % SODIUM CHLORIDE (POUR BTL) OPTIME
TOPICAL | Status: DC | PRN
  Administered 2024-07-01 – 2024-07-02 (×2): 1000 mL

## 2024-07-02 MED ORDER — PIPERACILLIN-TAZOBACTAM 3.375 G IVPB
3.3750 g | Freq: Three times a day (TID) | INTRAVENOUS | Status: AC
  Administered 2024-07-02 – 2024-07-06 (×14): 3.375 g via INTRAVENOUS
  Filled 2024-07-02 (×15): qty 50

## 2024-07-02 MED ORDER — ACETAMINOPHEN 10 MG/ML IV SOLN
INTRAVENOUS | Status: DC | PRN
  Administered 2024-07-02: 1000 mg via INTRAVENOUS

## 2024-07-02 MED ORDER — ACETAMINOPHEN 10 MG/ML IV SOLN
1000.0000 mg | Freq: Four times a day (QID) | INTRAVENOUS | Status: AC
  Administered 2024-07-02 – 2024-07-03 (×3): 1000 mg via INTRAVENOUS
  Filled 2024-07-02 (×3): qty 100

## 2024-07-02 MED ORDER — ONDANSETRON HCL 4 MG/2ML IJ SOLN: 4.0000 mg | Freq: Four times a day (QID) | INTRAMUSCULAR | Status: DC | PRN

## 2024-07-02 MED ORDER — POTASSIUM CHLORIDE 10 MEQ/100ML IV SOLN
10.0000 meq | Freq: Once | INTRAVENOUS | Status: AC
  Administered 2024-07-02: 10 meq via INTRAVENOUS

## 2024-07-02 MED ORDER — SODIUM CHLORIDE 0.9% FLUSH
9.0000 mL | INTRAVENOUS | Status: DC | PRN
  Administered 2024-07-02: 9 mL via INTRAVENOUS

## 2024-07-02 MED ORDER — HYDROMORPHONE 1 MG/ML IV SOLN: INTRAVENOUS | Status: DC

## 2024-07-02 MED ORDER — POTASSIUM CHLORIDE 10 MEQ/100ML IV SOLN
10.0000 meq | INTRAVENOUS | Status: AC
  Administered 2024-07-02 (×4): 10 meq via INTRAVENOUS
  Filled 2024-07-02 (×5): qty 100

## 2024-07-02 MED ORDER — ONDANSETRON 4 MG PO TBDP
4.0000 mg | ORAL_TABLET | Freq: Four times a day (QID) | ORAL | Status: DC | PRN
Start: 2024-07-02 — End: 2024-07-18

## 2024-07-02 MED ORDER — ONDANSETRON HCL 4 MG/2ML IJ SOLN
INTRAMUSCULAR | Status: DC | PRN
  Administered 2024-07-02: 4 mg via INTRAVENOUS

## 2024-07-02 MED ORDER — PANTOPRAZOLE SODIUM 40 MG IV SOLR
40.0000 mg | Freq: Two times a day (BID) | INTRAVENOUS | Status: DC
  Administered 2024-07-02 – 2024-07-16 (×29): 40 mg via INTRAVENOUS
  Filled 2024-07-02 (×29): qty 10

## 2024-07-02 MED ORDER — LACTATED RINGERS IV SOLN: INTRAVENOUS | Status: AC

## 2024-07-02 MED ORDER — ENOXAPARIN SODIUM 40 MG/0.4ML IJ SOSY
40.0000 mg | PREFILLED_SYRINGE | Freq: Every day | INTRAMUSCULAR | Status: DC
  Administered 2024-07-02 – 2024-07-04 (×3): 40 mg via SUBCUTANEOUS
  Filled 2024-07-02 (×4): qty 0.4

## 2024-07-02 MED ORDER — PIPERACILLIN-TAZOBACTAM 3.375 G IVPB
3.3750 g | Freq: Once | INTRAVENOUS | Status: AC
  Administered 2024-07-02: 3.375 g via INTRAVENOUS
  Filled 2024-07-02 (×2): qty 50

## 2024-07-02 MED ORDER — HYDROMORPHONE 1 MG/ML IV SOLN
INTRAVENOUS | Status: AC
  Filled 2024-07-02: qty 30

## 2024-07-02 MED ORDER — PANTOPRAZOLE SODIUM 40 MG IV SOLR: 40.0000 mg | INTRAVENOUS | Status: DC

## 2024-07-02 MED ORDER — METOPROLOL TARTRATE 5 MG/5ML IV SOLN: 5.0000 mg | Freq: Four times a day (QID) | INTRAVENOUS | Status: DC | PRN

## 2024-07-02 MED ORDER — HYDROMORPHONE HCL 1 MG/ML IJ SOLN
0.2500 mg | INTRAMUSCULAR | Status: DC | PRN
  Administered 2024-07-02: 0.25 mg via INTRAVENOUS

## 2024-07-02 NOTE — Progress Notes (Signed)
 Patient seen and examined; perforated pyloric peptic ulcer status post surgical repair. Patient denies N/V and expressed controlled abd pain. No CP or SOB. Feeling generally weak and deconditioned.  Please refer to consult note written by Dr. Debby on 07/02/24 for further info/details on admission.   Plan: -continue solucortef while NPO; and discussed with [name] once clear to take PO's to figure out steroids choice and tapering plan. -resume statin when tolerating PO's -if > 72 hours needed prior to take orals, synthroid  can be started at 1/2 regular dose IV. -continue as needed analgesics and follow post operative care rec's by primary team. -continue PPI BID.  Eric Nunnery MD [phone]

## 2024-07-02 NOTE — Evaluation (Signed)
 Physical Therapy Evaluation Patient Details Name: Allison Finley MRN: [id number] DOB: 1951/09/03 Today's Date: 07/02/2024  History of Present Illness  Pt is a 73 y.o. female who presented 07/01/24 with severe abdominal pain. Pt found to have perforated pyloric channel peptic ulcer, s/p ex lap with repair 7/12. PMH: ulcerative colitis  Clinical Impression  Pt presents with condition above and deficits mentioned below, see PT Problem List. PTA, she was independent without DME, living alone in a 1-level house with 4 STE. She has family that can assist PRN. Currently, the pt is demonstrating deficits in generalized strength, balance, and activity tolerance. She is requiring up to [facility] for bed mobility and transfers and CGA-minA to slowly ambulate household distances with a RW. She is at a risk for falls and has had a drastic functional decline. However, she has the potential to make quick functional progress and even progress to a mod I level fairly quickly. Therefore, she could greatly benefit from intensive inpatient rehab, > 3 hours/day. Will continue to follow acutely.      If plan is discharge home, recommend the following: A lot of help with walking and/or transfers;A lot of help with bathing/dressing/bathroom;Assistance with cooking/housework;Assist for transportation;Help with stairs or ramp for entrance   Can travel by private vehicle        Equipment Recommendations None recommended by PT  Recommendations for Other Services  Rehab consult;OT consult    Functional Status Assessment Patient has had a recent decline in their functional status and demonstrates the ability to make significant improvements in function in a reasonable and predictable amount of time.     Precautions / Restrictions Precautions Precautions: Fall Precaution/Restrictions Comments: NGT; abdominal precautions; R JP drain Restrictions Weight Bearing Restrictions Per Provider Order: No      Mobility   Bed Mobility Overal bed mobility: Needs Assistance Bed Mobility: Rolling, Sidelying to Sit Rolling: Min assist Sidelying to sit: Mod assist, HOB elevated       General bed mobility comments: Cues to roll to R then push up with arms to reduce abdominal pain, modA to manage trunk    Transfers Overall transfer level: Needs assistance Equipment used: Rolling walker (2 wheels) Transfers: Sit to/from Stand Sit to Stand: Mod assist           General transfer comment: Cues for hand placement, modA to power up to stand    Ambulation/Gait Ambulation/Gait assistance: Contact guard assist, Min assist Gait Distance (Feet): 90 Feet Assistive device: Rolling walker (2 wheels) Gait Pattern/deviations: Step-through pattern, Decreased step length - left, Decreased step length - right, Decreased stride length, Narrow base of support, Trunk flexed Gait velocity: reduced Gait velocity interpretation: <1.31 ft/sec, indicative of household ambulator   General Gait Details: Pt ambulates with narrow BOS and mild sway, needing CGA-minA for balance and safety. VCs provided to widen BOS.  Stairs            Wheelchair Mobility     Tilt Bed    Modified Rankin (Stroke Patients Only)       Balance Overall balance assessment: Needs assistance Sitting-balance support: No upper extremity supported, Feet supported Sitting balance-Leahy Scale: Fair     Standing balance support: Bilateral upper extremity supported, During functional activity, Reliant on assistive device for balance Standing balance-Leahy Scale: Poor Standing balance comment: reliant on RW  Pertinent Vitals/Pain Pain Assessment Pain Assessment: 0-10 Pain Score: 1  Pain Location: L abdomen Pain Descriptors / Indicators: Discomfort, Grimacing, Guarding, Operative site guarding Pain Intervention(s): Limited activity within patient's tolerance, Monitored during session, Repositioned,  Patient requesting pain meds-RN notified    Home Living Family/patient expects to be discharged to:: Private residence Living Arrangements: Alone Available Help at Discharge: Family;Available PRN/intermittently Type of Home: House Home Access: Stairs to enter Entrance Stairs-Rails: Doctor, general practice of Steps: 4   Home Layout: One level Home Equipment: Rollator (4 wheels);Cane - single point;Toilet riser;Tub bench;Grab bars - tub/shower      Prior Function Prior Level of Function : Independent/Modified Independent;Driving;Working/employed             Mobility Comments: Was IND without DME until last week or so when pain increased and pt began to use SPC vs rollator as needed ADLs Comments: works part time at [employer]     Extremity/Trunk Assessment   Upper Extremity Assessment Upper Extremity Assessment: Defer to OT evaluation    Lower Extremity Assessment Lower Extremity Assessment: Generalized weakness    Cervical / Trunk Assessment Cervical / Trunk Assessment: Other exceptions Cervical / Trunk Exceptions: abdominal incisions  Communication   Communication Communication: No apparent difficulties    Cognition Arousal: Alert Behavior During Therapy: WFL for tasks assessed/performed   PT - Cognitive impairments: No apparent impairments                         Following commands: Intact       Cueing Cueing Techniques: Verbal cues     General Comments General comments (skin integrity, edema, etc.): VSS on RA    Exercises     Assessment/Plan    PT Assessment Patient needs continued PT services  PT Problem List Decreased strength;Decreased activity tolerance;Decreased balance;Decreased mobility;Decreased knowledge of use of DME;Decreased skin integrity;Pain       PT Treatment Interventions DME instruction;Gait training;Stair training;Functional mobility training;Therapeutic activities;Therapeutic exercise;Balance  training;Neuromuscular re-education;Patient/family education    PT Goals (Current goals can be found in the Care Plan section)  Acute Rehab PT Goals Patient Stated Goal: to return to being independent PT Goal Formulation: With patient Time For Goal Achievement: 07/16/24 Potential to Achieve Goals: Good    Frequency Min 2X/week     Co-evaluation               AM-PAC PT 6 Clicks Mobility  Outcome Measure Help needed turning from your back to your side while in a flat bed without using bedrails?: A Little Help needed moving from lying on your back to sitting on the side of a flat bed without using bedrails?: A Lot Help needed moving to and from a bed to a chair (including a wheelchair)?: A Lot Help needed standing up from a chair using your arms (e.g., wheelchair or bedside chair)?: A Lot Help needed to walk in hospital room?: A Little Help needed climbing 3-5 steps with a railing? : Total 6 Click Score: 13    End of Session Equipment Utilized During Treatment: Gait belt;Oxygen (2L at start and end of session, RA during gait with VSS) Activity Tolerance: Patient tolerated treatment well Patient left: in chair;with call bell/phone within reach;with nursing/sitter in room (RN reports no need for chair alarm) Nurse Communication: Mobility status PT Visit Diagnosis: Unsteadiness on feet (R26.81);Other abnormalities of gait and mobility (R26.89);Muscle weakness (generalized) (M62.81);Difficulty in walking, not elsewhere classified (R26.2);Pain Pain - Right/Left:  (abdomen) Pain -  part of body:  (abdomen)    Time: 8469-8447 PT Time Calculation (min) (ACUTE ONLY): 22 min   Charges:   PT Evaluation $PT Eval Low Complexity: 1 Low   PT General Charges $$ ACUTE PT VISIT: 1 Visit         Theo Ferretti, PT, DPT Acute Rehabilitation Services  Office: [phone]   Theo CHRISTELLA Ferretti 07/02/2024, 5:06 PM

## 2024-07-02 NOTE — Progress Notes (Signed)
 1 Day Post-Op  Subjective: CC: LUQ pain improved. PCA was never set up. RN asking if we can switch to PRN meds instead.  No nausea. NGT in place with no output. This was clamped. Hooked up to LIWS w/ bilious output.  No flatus or BM.  Foley in place with good uop.  She has not been oob.  On 2L o2. Weaned to 1L. Does not smoke or wear o2 at home.  No alcohol, nsaid, tobacco or cocaine use. On 40mg  of prednisone  daily for UC since June.   Objective: Vital signs in last 24 hours: Temp:  [97.6 F (36.4 C)-97.7 F (36.5 C)] 97.6 F (36.4 C) (07/12 0314) Pulse Rate:  [69-86] 75 (07/12 0800) Resp:  [16-25] 18 (07/12 0800) BP: (76-115)/(47-68) 115/68 (07/12 0800) SpO2:  [91 %-100 %] 97 % (07/12 0800) Weight:  [59 kg] 59 kg (07/11 1751) Last BM Date : 07/01/24  Intake/Output from previous day: 07/11 0701 - 07/12 0700 In: 3199.1 [I.V.:1165.6; Blood:315; IV Piggyback:1718.5] Out: 1000 [Urine:600; Emesis/NG output:300; Drains:50; Blood:50] Intake/Output this shift: No intake/output data recorded.  PE: Gen:  Alert, NAD, pleasant HEENT:  NGT in place on LIWS Card:  Reg Pulm:  Rate and effort normal Abd: Soft, mild distension, mild luq ttp and appropriately tender around incision, no rigidity or guarding and otherwise NT, +BS. Incision with honeycomb dressing in place, cdi. JP drain SS Ext:  No LE edema  Psych: A&Ox3   Lab Results:  Recent Labs    07/01/24 1821 07/02/24 0629  WBC 11.9* 10.0  HGB 8.4* 8.3*  HCT 27.4* 26.6*  PLT 281 164   BMET Recent Labs    07/01/24 1821 07/02/24 0629  NA 126* 133*  K 4.0 3.1*  CL 94* 101  CO2 26 23  GLUCOSE 200* 139*  BUN 23 16  CREATININE 0.51 0.37*  CALCIUM  8.3* 7.7*   PT/INR Recent Labs    07/01/24 1821  LABPROT 16.1*  INR 1.2   CMP     Component Value Date/Time   NA 133 (L) 07/02/2024 0629   K 3.1 (L) 07/02/2024 0629   CL 101 07/02/2024 0629   CO2 23 07/02/2024 0629   GLUCOSE 139 (H) 07/02/2024 0629   BUN 16  07/02/2024 0629   CREATININE 0.37 (L) 07/02/2024 0629   CALCIUM  7.7 (L) 07/02/2024 0629   PROT 4.3 (L) 07/02/2024 0629   ALBUMIN  1.9 (L) 07/02/2024 0629   AST 13 (L) 07/02/2024 0629   ALT 16 07/02/2024 0629   ALKPHOS 43 07/02/2024 0629   BILITOT 0.8 07/02/2024 0629   GFRNONAA >60 07/02/2024 0629   Lipase     Component Value Date/Time   LIPASE <10 (L) 08/02/2021 2201    Studies/Results: CT ABDOMEN PELVIS W CONTRAST Addendum Date: 07/01/2024 ADDENDUM REPORT: 07/01/2024 21:23 ADDENDUM: The original report was by Dr. Ryan Salvage. The following addendum is by Dr. Ryan Salvage: Critical Value/emergent results were called by telephone at the time of interpretation on 07/01/2024 at 9:15 pm to provider Dr. Garrick, who verbally acknowledged these results. Electronically Signed   By: Ryan Salvage M.D.   On: 07/01/2024 21:23   Result Date: 07/01/2024 CLINICAL DATA:  Acute abdominal pain. History of ulcerative colitis and irritable bowel syndrome EXAM: CT ABDOMEN AND PELVIS WITH CONTRAST TECHNIQUE: Multidetector CT imaging of the abdomen and pelvis was performed using the standard protocol following bolus administration of intravenous contrast. RADIATION DOSE REDUCTION: This exam was performed according to the departmental dose-optimization program which  includes automated exposure control, adjustment of the mA and/or kV according to patient size and/or use of iterative reconstruction technique. CONTRAST:  60mL OMNIPAQUE  IOHEXOL  350 MG/ML SOLN COMPARISON:  05/19/2024 FINDINGS: Lower chest: Scarring and pleural-based nodularity posteriorly in the right lower lobe is not changed from 08/02/2021 and is considered benign. Likewise there is chronic scarring in the lingula. Hepatobiliary: Contracted gallbladder. Gas along the porta hepatis and falciform ligament is favored to be extraluminal intraperitoneal gas rather than pneumobilia. No biliary dilatation. Pancreas: Notable pancreatic atrophy  aside from the pancreatic head. Spleen: Unremarkable Adrenals/Urinary Tract: Substantial cystocele noted with pelvic floor laxity. The kidneys and adrenal glands appear unremarkable. Stomach/Bowel: Large anterior pyloric ulcer on image 29 series 3 with a small amount of nearby extraluminal gas along the falciform ligament and porta hepatis suspicious for early perforation of a pyloric channel ulcer. Surgical consultation recommended. There is abnormal wall thickening throughout the colon the exception of the cecum, compatible with nonspecific colitis. No pneumatosis of the colon is currently identified. Vascular/Lymphatic: Atherosclerosis is present, including aortoiliac atherosclerotic disease. No findings of occlusion of the celiac trunk, SMA, or IMA. Reproductive: Uterus not visualized, suspected vaginal prolapse. Other: Abnormal ascites particularly in the perihepatic region and right paracolic gutter. Trace edema in the omentum diffusely. Musculoskeletal: Right upper abdominal paracentral hernia containing adipose tissue and edema shown on image 30 series 3, the hernia extends between the oblique musculature and transverse abdominis. Lumbar spondylosis and degenerative disc disease causing left foraminal impingement at L4-5 and L5-S1. IMPRESSION: 1. Large anterior pyloric channel ulcer with a small amount of nearby intraperitoneal gas along the falciform ligament and porta hepatis suspicious for early perforation of a pyloric channel ulcer. Ascites noted eccentric to the right. Surgical consultation recommended. 2. Abnormal wall thickening throughout the colon the exception of the cecum, compatible with nonspecific colitis. No pneumatosis of the colon is currently identified. 3. Abnormal ascites particularly in the perihepatic region and right paracolic gutter. Trace edema in the omentum diffusely. 4. Right upper abdominal paracentral hernia containing adipose tissue and edema in between the transverse  abdominus and oblique musculature. 5. Substantial cystocele with pelvic floor laxity. Likely vaginal prolapse. Low position of the anorectal junction. 6. Lumbar spondylosis and degenerative disc disease causing left foraminal impingement at L4-5 and L5-S1. 7.  Aortic Atherosclerosis (ICD10-I70.0). Radiology assistant personnel have been notified to put me in telephone contact with the referring physician or the referring physician's clinical representative in order to discuss these findings. Once this communication is established I will issue an addendum to this report for documentation purposes. Electronically Signed: By: Ryan Salvage M.D. On: 07/01/2024 21:08   DG Chest Portable 1 View Result Date: 07/01/2024 CLINICAL DATA:  Sepsis. EXAM: PORTABLE CHEST 1 VIEW COMPARISON:  None Available. FINDINGS: The heart size and mediastinal contours are within normal limits. Right lung is clear. Minimal left basilar subsegmental atelectasis is noted. The visualized skeletal structures are unremarkable. IMPRESSION: Minimal left basilar subsegmental atelectasis. Electronically Signed   By: Lynwood Landy Raddle M.D.   On: 07/01/2024 18:45    Anti-infectives: Anti-infectives (From admission, onward)    Start     Dose/Rate Route Frequency Ordered Stop   07/02/24 1000  piperacillin -tazobactam (ZOSYN ) IVPB 3.375 g        3.375 g 12.5 mL/hr over 240 Minutes Intravenous Every 8 hours 07/02/24 0440     07/02/24 0500  micafungin  (MYCAMINE ) 100 mg in sodium chloride  0.9 % 100 mL IVPB  100 mg 105 mL/hr over 1 Hours Intravenous Daily 07/02/24 0315     07/02/24 0145  piperacillin -tazobactam (ZOSYN ) IVPB 3.375 g        3.375 g 100 mL/hr over 30 Minutes Intravenous  Once 07/02/24 0134 07/02/24 0356   07/01/24 2237  ceFAZolin  (ANCEF ) 2-4 GM/100ML-% IVPB       Note to Pharmacy: Roddie Grate: cabinet override      07/01/24 2237 07/02/24 1044        Assessment/Plan POD 0 s/p Exploratory laparotomy with  [name] repair of perforated peptic ulcer by Dr. Teresa on 07/02/24 for Perforated pyloric channel peptic ulcer  - Cont NGT to LIWS, nothing per tube, strict NPO, plan UGI POD #3 - Cont JP. SS - Cont IV abx and antifungals, plan 5d post op - Cont BID PPI - Check H. Pylori  - PRN IV pain meds. Avoid NSAIDs - Mobilize, PT - Pulm toilet  FEN - NPO, NGT to LIWS, IVF. Replace K  VTE - SCDs, Lovenox   ID - Zosyn , Micafungin   Foley - In place, strict I/O, good uop currently, plan TOV POD 1 Plan - As above.   Hx of UC on  40 mg prednisone  per day - on solucortef currently. TRH consult to assist with stress dose steroid dosing.    LOS: 0 days    Allison Finley, [facility] Surgery 07/02/2024, 8:05 AM Please see Amion for pager number during day hours 7:00am-4:30pm

## 2024-07-02 NOTE — Op Note (Signed)
 07/01/2024 - 07/02/2024  12:43 AM  PATIENT:  Allison Finley  73 y.o. female  Patient Care Team: James, Natalie W, PA-C as PCP - General (Family Medicine)  PRE-OPERATIVE DIAGNOSIS:  Perforated viscus  POST-OPERATIVE DIAGNOSIS: Perforated pyloric channel peptic ulcer  PROCEDURE: Exploratory laparotomy with [name] repair of perforated peptic ulcer  SURGEON:  Lonni HERO. Joanell Cressler, MD  ASSISTANT: OR Staff  ANESTHESIA:   general  COUNTS:  Sponge, needle and instrument counts were reported correct x2 at the conclusion of the operation.  EBL: 50 mL  DRAINS: 19Fr round blake drain left draining RUQ  SPECIMEN: none  COMPLICATIONS: none  FINDINGS: Perforated peptic ulcer overlying the pyloric channel, 5 x 5 mm with clean edges.  Otherwise normal-appearing stomach and duodenum.  Normal gallbladder.  Colon is unremarkable in appearance.  Small bowel is unremarkable in appearance.  DISPOSITION: PACU in satisfactory condition  DESCRIPTION: The patient was identified in preop holding and taken to the OR where she was placed on the operating room table. SCDs were placed. General endotracheal anesthesia was induced without difficulty.  Pressure points were evaluated and padded.  A Foley catheter was placed by nursing.  She was then prepped and draped in the usual sterile fashion. A surgical timeout was performed indicating the correct patient, procedure, positioning and need for preoperative antibiotics.   An NG tube had been placed by anesthesia.  A upper midline incision is created.  Subcutaneous tissue device electrocautery.  The fascia incised the midline.  A Balfour retractor was placed.  Upon entering the peritoneal space, we immediately encountered thin bilious ascites.  This was evacuated with suction.  Small bowel is distended with gas.  Is normal in appearance.  We are able to evaluate the ascending and transverse colon which are normal in appearance.  The sigmoid colon was  able to be palpated and normal.  Attention is then turned to the upper abdomen.  The liver is normal in appearance.  The gallbladder is normal in appearance.  NG tube is palpated in the stomach.  The anterior body of the stomach is normal.  We do immediately identify a pyloric channel peptic ulcer which has perforated with clean appearing edges.  Perforation is 5 x 5 mm in size.  Some omental adhesions were taken down from the gallbladder to facilitate further exposure of this.  In fairly close proximity, there is a nice healthy appearing tongue of omentum which would be a suitable candidate for a Graham patch type repair.  We are able to open the lesser sac and evaluate the posterior wall of the stomach and there were no other perforations noted.  The duodenum is healthy in appearance without any thickening or perforation.  Preparations were made for a [name].  The tongue of omentum was brought up to the upper abdomen.  3-0 silk sutures were then used to take nice healthy bites of tissue at the site of the perforation at 3 different locations.  The tongue of omentum was then brought through this and the 3-0 silk sutures were then gently tied down over the omental patch securing it in place but taking care not to cause ischemia.  The area is inspected.  The patch is intact and complete.  The abdomen is then irrigated with copious amounts of warm saline until the effluent runs clear.  A 19 French round Blake drain is brought out through a stab incision in the right lateral abdomen.  This drain is left draining our repair  in the upper abdomen.  This is secured using a 2-0 nylon suture.  Midline fascia is then closed using 2 running #1 PDS sutures.  The closure was palpated noted to be complete without any gaps.  All sponge, needle, and instrument counts are reported correct.  The wound is then irrigated with copious amounts of saline.  The skin is then approximated using staples.  The abdomen is washed and  dried.  A honeycomb dressing is applied.  She was given 1 unit of blood intraoperatively given her baseline anemia.  She was awakened from anesthesia, extubated, and transferred to a stretcher for transport to recovery in satisfactory condition.

## 2024-07-02 NOTE — Progress Notes (Signed)
 Inpatient Rehab Admissions Coordinator Note:   Per PT patient was screened for CIR candidacy by Emoni Whitworth SHAUNNA Yvone Cohens, CCC-SLP. At this time, pt appears to be a potential candidate for CIR. If pt would like to be considered, please place an IP Rehab MD consult order.   Tinnie Yvone Cohens, MS, CCC-SLP Admissions Coordinator [phone] 07/02/24 6:13 PM

## 2024-07-02 NOTE — Transfer of Care (Signed)
 Immediate Anesthesia Transfer of Care Note  Patient: Allison Finley  Procedure(s) Performed: LAPAROTOMY, EXPLORATORY  Patient Location: PACU  Anesthesia Type:General  Level of Consciousness: awake, alert , and oriented  Airway & Oxygen Therapy: Patient Spontanous Breathing and Patient connected to nasal cannula oxygen  Post-op Assessment: Report given to RN and Post -op Vital signs reviewed and stable  Post vital signs: Reviewed and stable  Last Vitals:  Vitals Value Taken Time  BP 111/61 07/02/24 01:15  Temp 36.5 C 07/02/24 01:15  Pulse 78 07/02/24 01:17  Resp 22 07/02/24 01:19  SpO2 100 % 07/02/24 01:17  Vitals shown include unfiled device data.  Last Pain:  Vitals:   07/01/24 2126  TempSrc:   PainSc: 9          Complications: No notable events documented.

## 2024-07-02 NOTE — Anesthesia Postprocedure Evaluation (Signed)
 Anesthesia Post Note  Patient: Allison Finley  Procedure(s) Performed: Exploratory laparotomy with [name] repair of perforated peptic ulcer (Abdomen)     Patient location during evaluation: PACU Anesthesia Type: General Level of consciousness: awake and alert Pain management: pain level controlled Vital Signs Assessment: post-procedure vital signs reviewed and stable Respiratory status: spontaneous breathing, nonlabored ventilation, respiratory function stable and patient connected to nasal cannula oxygen Cardiovascular status: blood pressure returned to baseline and stable Postop Assessment: no apparent nausea or vomiting Anesthetic complications: no   No notable events documented.  Last Vitals:  Vitals:   07/02/24 0200 07/02/24 0215  BP: 102/62 103/65  Pulse: 75 77  Resp: 16 18  Temp:    SpO2:      Last Pain:  Vitals:   07/02/24 0128  TempSrc:   PainSc: 7                  Avinash Maltos,W. EDMOND

## 2024-07-02 NOTE — Consult Note (Signed)
 Consult Note   Allison Finley FMW:[id number] DOB: 09/13/1951 DOA: 07/01/2024  PCP: Lynwood Laneta ORN, PA-C  Patient coming from: home  I have personally briefly reviewed patient's old medical records in [facility]  Chief Complaint: abdominal pain   HPI: Allison Finley is a 73 y.o. female with medical history significant of Ulcerative colitis with rectal bleeding diagnosed 3 weeks ago, hypothyroidism, IDA,HLD diverticulosis  who presents to ED with  Abdominal pain x one month but acutely worse over the last few days and significantly worse over the last 24 hours.Internal medicine is consulted for management of patient chronic conditions, specifically evaluation need for stress dose steroids in setting of chronic steroid use.  Hospital course  Patient on evaluation in ED note to have perforated viscous on Ctscan  Surgery was consulted and patient was taken urgently to surgery. Patient diagnosed with Perforated pyloric channel peptic ulcer  and is Exploratory laparotomy with [name] repair of perforated peptic ulcer.  Patient of note during surgery has EBL of 50ml and was given 1 unit of PRBC intra-op.  Patient tolerated surgery well and there are no noted complications Labs Wbc 11.9, K 8.4 ( 10.2), plt 281 Na 126 ( 135) , glu 200, cr 0.51 Tx -decadron , albumin  , cefazolin , ofirmev ,zofran , NS  Review of Systems: As per HPI otherwise 10 point review of systems negative.   Past Medical History:  Diagnosis Date   Inflammatory bowel disease (ulcerative colitis) (HCC) 05/20/2024    Past Surgical History:  Procedure Laterality Date   BONE BIOPSY  05/21/2024   Procedure: BIOPSY, GI;  Surgeon: Legrand Victory LITTIE DOUGLAS, MD;  Location: [facility];  Service: Gastroenterology;;   ENID SIGMOIDOSCOPY N/A 05/21/2024   Procedure: KINGSTON ENID;  Surgeon: Legrand Victory LITTIE DOUGLAS, MD;  Location: [facility];  Service: Gastroenterology;  Laterality: N/A;     reports  that she has never smoked. She has never used smokeless tobacco. She reports that she does not drink alcohol and does not use drugs.  Allergies  Allergen Reactions   Beef-Derived Drug Products    Chicken Allergy    Pork-Derived Products Other (See Comments)    vegetarian    History reviewed. No pertinent family history.  Prior to Admission medications   Medication Sig Start Date End Date Taking? Authorizing Provider  levothyroxine  (SYNTHROID ) 112 MCG tablet Take 112 mcg by mouth daily.    [provider]  Multiple Vitamin (MULTIVITAMIN WITH MINERALS) TABS tablet Take 1 tablet by mouth daily.    [provider]  omeprazole  (PRILOSEC) 20 MG capsule Take 1 capsule (20 mg total) by mouth daily. 08/03/21   Theadore Ozell HERO, MD  predniSONE  (DELTASONE ) 10 MG tablet Take 4 tablets (40 mg total) by mouth daily for 10 days, THEN 3 tablets (30 mg total) daily for 10 days, THEN 2 tablets (20 mg total) daily for 10 days, THEN 1 tablet (10 mg total) daily for 10 days. Patient taking differently: 40 mg daily  05/25/24 07/04/24  Tawkaliyar, Roya, DO  rosuvastatin  (CRESTOR ) 5 MG tablet Take 5 mg by mouth daily.    [provider]    Physical Exam: Vitals:   07/01/24 2000 07/01/24 2047 07/01/24 2145 07/02/24 0115  BP: (!) 91/55 104/65 (!) 95/54 111/61  Pulse: 77 83 85 80  Resp: (!) 23 18 (!) 22 20  Temp:  97.7 F (36.5 C)  97.7 F (36.5 C)  TempSrc:  Oral    SpO2: 100% 100% 98% 100%  Weight:      Height:        Constitutional: NAD, calm, comfortable Vitals:   07/01/24 2000 07/01/24 2047 07/01/24 2145 07/02/24 0115  BP: (!) 91/55 104/65 (!) 95/54 111/61  Pulse: 77 83 85 80  Resp: (!) 23 18 (!) 22 20  Temp:  97.7 F (36.5 C)  97.7 F (36.5 C)  TempSrc:  Oral    SpO2: 100% 100% 98% 100%  Weight:      Height:       Eyes: pupils equal, lids and conjunctivae normal ENMT: Mucous membranes are moist. Posterior pharynx clear of any exudate or lesions.Normal dentition.   Neck: normal, supple, no masses, no thyromegaly Respiratory: clear to auscultation bilaterally, no wheezing, no crackles. Normal respiratory effort. No accessory muscle use.  Cardiovascular: Regular rate and rhythm, no murmurs / rubs / gallops. No extremity edema.  Abdomen: appropriate surgical tenderness, no masses palpated. No hepatosplenomegaly. Bowel sounds positive.  Musculoskeletal: no clubbing / cyanosis. No joint deformity upper and lower extremities. Good ROM, no contractures. Normal muscle tone.  Skin: no rashes, lesions, ulcers. No induration Neurologic: CN  grossly intact. Sensation intact, Strength 5/5 in all 4.  Psychiatric: Normal judgment and insight. Alert and oriented x 3. Normal mood.    Labs on Admission: I have personally reviewed following labs and imaging studies  CBC: Recent Labs  Lab 07/01/24 1821  WBC 11.9*  NEUTROABS 9.4*  HGB 8.4*  HCT 27.4*  MCV 89.8  PLT 281   Basic Metabolic Panel: Recent Labs  Lab 07/01/24 1821  NA 126*  K 4.0  CL 94*  CO2 26  GLUCOSE 200*  BUN 23  CREATININE 0.51  CALCIUM  8.3*   GFR: Estimated Creatinine Clearance: 49.5 mL/min (by C-G formula based on SCr of 0.51 mg/dL). Liver Function Tests: Recent Labs  Lab 07/01/24 1821  AST 18  ALT 22  ALKPHOS 66  BILITOT 0.4  PROT 5.4*  ALBUMIN  1.9*   No results for input(s): LIPASE, AMYLASE in the last 168 hours. No results for input(s): AMMONIA in the last 168 hours. Coagulation Profile: Recent Labs  Lab 07/01/24 1821  INR 1.2   Cardiac Enzymes: No results for input(s): CKTOTAL, CKMB, CKMBINDEX, TROPONINI in the last 168 hours. BNP (last 3 results) No results for input(s): PROBNP in the last 8760 hours. HbA1C: No results for input(s): HGBA1C in the last 72 hours. CBG: No results for input(s): GLUCAP in the last 168 hours. Lipid Profile: No results for input(s): CHOL, HDL, LDLCALC, TRIG, CHOLHDL, LDLDIRECT in the last 72  hours. Thyroid Function Tests: No results for input(s): TSH, T4TOTAL, FREET4, T3FREE, THYROIDAB in the last 72 hours. Anemia Panel: No results for input(s): VITAMINB12, FOLATE, FERRITIN, TIBC, IRON , RETICCTPCT in the last 72 hours. Urine analysis:    Component Value Date/Time   COLORURINE AMBER (A) 07/01/2024 2146   APPEARANCEUR CLOUDY (A) 07/01/2024 2146   LABSPEC >1.046 (H) 07/01/2024 2146   PHURINE 5.0 07/01/2024 2146   GLUCOSEU NEGATIVE 07/01/2024 2146   HGBUR MODERATE (A) 07/01/2024 2146   BILIRUBINUR NEGATIVE 07/01/2024 2146   KETONESUR 5 (A) 07/01/2024 2146   PROTEINUR 30 (A) 07/01/2024 2146   NITRITE NEGATIVE 07/01/2024 2146   LEUKOCYTESUR MODERATE (A) 07/01/2024 2146    Radiological Exams on Admission: CT ABDOMEN PELVIS W CONTRAST Addendum Date: 07/01/2024 ADDENDUM REPORT: 07/01/2024 21:23 ADDENDUM: The original report was by Dr. Ryan Salvage. The following addendum is by Dr. Ryan Salvage: Critical Value/emergent results were called by telephone at  the time of interpretation on 07/01/2024 at 9:15 pm to provider Dr. Garrick, who verbally acknowledged these results. Electronically Signed   By: Ryan Salvage M.D.   On: 07/01/2024 21:23   Result Date: 07/01/2024 CLINICAL DATA:  Acute abdominal pain. History of ulcerative colitis and irritable bowel syndrome EXAM: CT ABDOMEN AND PELVIS WITH CONTRAST TECHNIQUE: Multidetector CT imaging of the abdomen and pelvis was performed using the standard protocol following bolus administration of intravenous contrast. RADIATION DOSE REDUCTION: This exam was performed according to the departmental dose-optimization program which includes automated exposure control, adjustment of the mA and/or kV according to patient size and/or use of iterative reconstruction technique. CONTRAST:  60mL OMNIPAQUE  IOHEXOL  350 MG/ML SOLN COMPARISON:  05/19/2024 FINDINGS: Lower chest: Scarring and pleural-based nodularity posteriorly  in the right lower lobe is not changed from 08/02/2021 and is considered benign. Likewise there is chronic scarring in the lingula. Hepatobiliary: Contracted gallbladder. Gas along the porta hepatis and falciform ligament is favored to be extraluminal intraperitoneal gas rather than pneumobilia. No biliary dilatation. Pancreas: Notable pancreatic atrophy aside from the pancreatic head. Spleen: Unremarkable Adrenals/Urinary Tract: Substantial cystocele noted with pelvic floor laxity. The kidneys and adrenal glands appear unremarkable. Stomach/Bowel: Large anterior pyloric ulcer on image 29 series 3 with a small amount of nearby extraluminal gas along the falciform ligament and porta hepatis suspicious for early perforation of a pyloric channel ulcer. Surgical consultation recommended. There is abnormal wall thickening throughout the colon the exception of the cecum, compatible with nonspecific colitis. No pneumatosis of the colon is currently identified. Vascular/Lymphatic: Atherosclerosis is present, including aortoiliac atherosclerotic disease. No findings of occlusion of the celiac trunk, SMA, or IMA. Reproductive: Uterus not visualized, suspected vaginal prolapse. Other: Abnormal ascites particularly in the perihepatic region and right paracolic gutter. Trace edema in the omentum diffusely. Musculoskeletal: Right upper abdominal paracentral hernia containing adipose tissue and edema shown on image 30 series 3, the hernia extends between the oblique musculature and transverse abdominis. Lumbar spondylosis and degenerative disc disease causing left foraminal impingement at L4-5 and L5-S1. IMPRESSION: 1. Large anterior pyloric channel ulcer with a small amount of nearby intraperitoneal gas along the falciform ligament and porta hepatis suspicious for early perforation of a pyloric channel ulcer. Ascites noted eccentric to the right. Surgical consultation recommended. 2. Abnormal wall thickening throughout the colon  the exception of the cecum, compatible with nonspecific colitis. No pneumatosis of the colon is currently identified. 3. Abnormal ascites particularly in the perihepatic region and right paracolic gutter. Trace edema in the omentum diffusely. 4. Right upper abdominal paracentral hernia containing adipose tissue and edema in between the transverse abdominus and oblique musculature. 5. Substantial cystocele with pelvic floor laxity. Likely vaginal prolapse. Low position of the anorectal junction. 6. Lumbar spondylosis and degenerative disc disease causing left foraminal impingement at L4-5 and L5-S1. 7.  Aortic Atherosclerosis (ICD10-I70.0). Radiology assistant personnel have been notified to put me in telephone contact with the referring physician or the referring physician's clinical representative in order to discuss these findings. Once this communication is established I will issue an addendum to this report for documentation purposes. Electronically Signed: By: Ryan Salvage M.D. On: 07/01/2024 21:08   DG Chest Portable 1 View Result Date: 07/01/2024 CLINICAL DATA:  Sepsis. EXAM: PORTABLE CHEST 1 VIEW COMPARISON:  None Available. FINDINGS: The heart size and mediastinal contours are within normal limits. Right lung is clear. Minimal left basilar subsegmental atelectasis is noted. The visualized skeletal structures are unremarkable. IMPRESSION: Minimal left basilar  subsegmental atelectasis. Electronically Signed   By: Lynwood Landy Raddle M.D.   On: 07/01/2024 18:45    EKG: Independently reviewed.   Assessment/Plan Acute gastric perforation s/p exlap/surgical repair  - post op surgical care per primary team  UTI -currently covered by [employer]  -f/u on urine culture   Anemia of blood loss - drop from 10 to8.4  -s/p prbc intra op  -check iron  stores   Hypothyroidism -if patient will be npo x >72 hours will switch to IV synthroid    Ulcerative colitis  -hx of recent flare 3 weeks ago with rectal  bleeding  - patient was placed on steroid taper , however it appears patient has continued to take prednisone  40 mg  - will place on solucortef for now while npo and then transition to back to 40 mg  -will have to follow with gi to evaluate timing on taper   HLD  -hold statin while npo  DVT prophylaxis: per surgery  Code Status: full/ as discussed per patient wishes in event of cardiac arrest  Camila DELENA Ned MD [facility]  If 7PM-7AM, please contact night-coverage [email or web address] Password [email or web address]  07/02/2024, 1:35 AM

## 2024-07-03 DIAGNOSIS — K3189 Other diseases of stomach and duodenum: Secondary | ICD-10-CM | POA: Diagnosis not present

## 2024-07-03 LAB — BASIC METABOLIC PANEL WITH GFR
Anion gap: 9 (ref 5–15)
BUN: 17 mg/dL (ref 8–23)
CO2: 23 mmol/L (ref 22–32)
Calcium: 8.7 mg/dL — ABNORMAL LOW (ref 8.9–10.3)
Chloride: 101 mmol/L (ref 98–111)
Creatinine, Ser: 0.52 mg/dL (ref 0.44–1.00)
GFR, Estimated: >60 mL/min (ref >60)
Glucose, Bld: 73 mg/dL (ref 70–99)
Potassium: 4 mmol/L (ref 3.5–5.1)
Sodium: 133 mmol/L — ABNORMAL LOW (ref 135–145)

## 2024-07-03 LAB — CBC
HCT: 31.8 % — ABNORMAL LOW (ref 36.0–46.0)
Hemoglobin: 9.8 g/dL — ABNORMAL LOW (ref 12.0–15.0)
MCH: 27.6 pg (ref 26.0–34.0)
MCHC: 30.8 g/dL (ref 30.0–36.0)
MCV: 89.6 fL (ref 80.0–100.0)
Platelets: 206 K/uL (ref 150–400)
RBC: 3.55 MIL/uL — ABNORMAL LOW (ref 3.87–5.11)
RDW: 18.1 % — ABNORMAL HIGH (ref 11.5–15.5)
WBC: 13.6 K/uL — ABNORMAL HIGH (ref 4.0–10.5)
nRBC: 0 % (ref 0.0–0.2)

## 2024-07-03 LAB — PHOSPHORUS: Phosphorus: 3 mg/dL (ref 2.5–4.6)

## 2024-07-03 LAB — MAGNESIUM: Magnesium: 2 mg/dL (ref 1.7–2.4)

## 2024-07-03 MED ORDER — METHYLPREDNISOLONE SODIUM SUCC 125 MG IJ SOLR
40.0000 mg | Freq: Every day | INTRAMUSCULAR | Status: DC
  Administered 2024-07-04 – 2024-07-06 (×3): 40 mg via INTRAVENOUS
  Filled 2024-07-03 (×3): qty 2

## 2024-07-03 MED ORDER — LACTATED RINGERS IV SOLN: INTRAVENOUS | Status: AC

## 2024-07-03 MED ORDER — ACETAMINOPHEN 10 MG/ML IV SOLN
1000.0000 mg | Freq: Four times a day (QID) | INTRAVENOUS | Status: AC
  Administered 2024-07-03 – 2024-07-04 (×4): 1000 mg via INTRAVENOUS
  Filled 2024-07-03 (×4): qty 100

## 2024-07-03 MED ORDER — HYDROCORTISONE SOD SUC (PF) 100 MG IJ SOLR
100.0000 mg | Freq: Two times a day (BID) | INTRAMUSCULAR | Status: AC
  Administered 2024-07-03: 100 mg via INTRAVENOUS
  Filled 2024-07-03: qty 2

## 2024-07-03 NOTE — Consult Note (Signed)
 Consult NOTE    Allison Finley  FMW:[id number] DOB: [birth year] DOA: 07/01/2024 PCP: Lynwood Laneta ORN, PA-C  Chief Complaint  Patient presents with   Abdominal Pain    Brief Narrative:   73 yo with hhx ulcerative colitis, hypothyroidism, IDA, dyslipidemia and other medical issues presenting with perforated peptic ulcer now s/p graham patch repair on 07/02/2024.  Hospitalists were consulted for medical management.  Assessment & Plan:   Principal Problem:   Gastric perforation, acute  Acute gastric perforation s/p exlap/surgical repair  - s/p ex lap with graham patch repair of perforated peptic ulcer 7/12 - post op surgical care per primary team - currently with NGT to LIS.  Strict NPO.  Abx and antifungals per surgery.  BID PPI.  Pending H pylori workup.  See note.   Ulcerative colitis  - hx of recent flare 3 weeks ago with rectal bleeding  - patient was placed on steroid taper , however it appears patient has continued to take prednisone  40 mg  - currently on solucortef -> will transition to solumedrol 40 mg daily tmrw - will need to discuss with GI regarding taper when able to take PO   Anemia - related to blood loss - iron  def.  Elevated b12, normal folate.  Ferritin normal.  Hypothyroidism -if patient will be npo x >72 hours will switch to IV synthroid    Pyuria - UA contaminated, no urine culture collected - would follow repeat UA sometime later in course to ensure clearance of microscopic hematuria    HLD  -hold statin while npo    DVT prophylaxis: lovenox  Code Status: full Family Communication: none Disposition:   Per general surgery, primary team   Consultants:  TRH is consulting Surgery is primary  Procedures:   s/p ex lap with graham patch repair of perforated peptic ulcer 7/12  Antimicrobials:  Anti-infectives (From admission, onward)    Start     Dose/Rate Route Frequency Ordered Stop   07/02/24 1000  piperacillin -tazobactam (ZOSYN )  IVPB 3.375 g        3.375 g 12.5 mL/hr over 240 Minutes Intravenous Every 8 hours 07/02/24 0440     07/02/24 0500  micafungin  (MYCAMINE ) 100 mg in sodium chloride  0.9 % 100 mL IVPB        100 mg 105 mL/hr over 1 Hours Intravenous Daily 07/02/24 0315     07/02/24 0145  piperacillin -tazobactam (ZOSYN ) IVPB 3.375 g        3.375 g 100 mL/hr over 30 Minutes Intravenous  Once 07/02/24 0134 07/02/24 0354   07/01/24 2237  ceFAZolin  (ANCEF ) 2-4 GM/100ML-% IVPB       Note to Pharmacy: Roddie Grate: cabinet override      07/01/24 2237 07/02/24 1044       Subjective: No new complaints Passed some stool this AM  Objective: Vitals:   07/02/24 1904 07/02/24 2330 07/03/24 0420 07/03/24 0728  BP: 115/76 125/83 113/63 113/79  Pulse: 63 67 80 73  Resp: 16 20 15 18   Temp: (!) 97.5 F (36.4 C) 97.7 F (36.5 C) 97.9 F (36.6 C) (!) 97.5 F (36.4 C)  TempSrc: Oral Oral Oral Oral  SpO2: 96% 96% 95% 96%  Weight:      Height:        Intake/Output Summary (Last 24 hours) at 07/03/2024 0922 Last data filed at 07/03/2024 0640 Gross per 24 hour  Intake 2746.69 ml  Output 1085 ml  Net 1661.69 ml   Filed Weights   07/01/24 1751  Weight: 59 kg    Examination:  General exam: Appears calm and comfortable  Respiratory system: unlabored Cardiovascular system: RRR Gastrointestinal system: midline dressing, JP with serosanguinous output - appropriately tender - Green output in NGT cannister Central nervous system: Alert and oriented. No focal neurological deficits. Extremities: no LEE   Data Reviewed: I have personally reviewed following labs and imaging studies  CBC: Recent Labs  Lab 07/01/24 1821 07/02/24 0629 07/03/24 0658  WBC 11.9* 10.0 13.6*  NEUTROABS 9.4*  --   --   HGB 8.4* 8.3* 9.8*  HCT 27.4* 26.6* 31.8*  MCV 89.8 88.4 89.6  PLT 281 164 206    Basic Metabolic Panel: Recent Labs  Lab 07/01/24 1821 07/02/24 0629  NA 126* 133*  K 4.0 3.1*  CL 94* 101  CO2 26 23   GLUCOSE 200* 139*  BUN 23 16  CREATININE 0.51 0.37*  CALCIUM  8.3* 7.7*    GFR: Estimated Creatinine Clearance: 49.5 mL/min (Rosita Guzzetta) (by C-G formula based on SCr of 0.37 mg/dL (L)).  Liver Function Tests: Recent Labs  Lab 07/01/24 1821 07/02/24 0629  AST 18 13*  ALT 22 16  ALKPHOS 66 43  BILITOT 0.4 0.8  PROT 5.4* 4.3*  ALBUMIN  1.9* 1.9*    CBG: No results for input(s): GLUCAP in the last 168 hours.   Recent Results (from the past 240 hours)  Culture, blood (Routine x 2)     Status: None (Preliminary result)   Collection Time: 07/01/24  5:50 PM   Specimen: BLOOD LEFT FOREARM  Result Value Ref Range Status   Specimen Description BLOOD LEFT FOREARM  Final   Special Requests   Final    BOTTLES DRAWN AEROBIC AND ANAEROBIC Blood Culture adequate volume   Culture   Final    NO GROWTH 2 DAYS Performed at [facility], [address] [place], [place] [address]    Report Status PENDING  Incomplete  Culture, blood (Routine x 2)     Status: None (Preliminary result)   Collection Time: 07/01/24  5:55 PM   Specimen: BLOOD  Result Value Ref Range Status   Specimen Description BLOOD RIGHT ANTECUBITAL  Final   Special Requests   Final    BOTTLES DRAWN AEROBIC AND ANAEROBIC Blood Culture adequate volume   Culture   Final    NO GROWTH 2 DAYS Performed at [facility], [address] [place], [place] [address]    Report Status PENDING  Incomplete         Radiology Studies: CT ABDOMEN PELVIS W CONTRAST Addendum Date: 07/01/2024 ADDENDUM REPORT: 07/01/2024 21:23 ADDENDUM: The original report was by Dr. Ryan Salvage. The following addendum is by Dr. Ryan Salvage: Critical Value/emergent results were called by telephone at the time of interpretation on 07/01/2024 at 9:15 pm to provider Dr. Garrick, who verbally acknowledged these results. Electronically Signed   By: Ryan Salvage M.D.   On: 07/01/2024 21:23   Result Date: 07/01/2024 CLINICAL DATA:   Acute abdominal pain. History of ulcerative colitis and irritable bowel syndrome EXAM: CT ABDOMEN AND PELVIS WITH CONTRAST TECHNIQUE: Multidetector CT imaging of the abdomen and pelvis was performed using the standard protocol following bolus administration of intravenous contrast. RADIATION DOSE REDUCTION: This exam was performed according to the departmental dose-optimization program which includes automated exposure control, adjustment of the mA and/or kV according to patient size and/or use of iterative reconstruction technique. CONTRAST:  60mL OMNIPAQUE  IOHEXOL  350 MG/ML SOLN COMPARISON:  05/19/2024 FINDINGS: Lower  chest: Scarring and pleural-based nodularity posteriorly in the right lower lobe is not changed from 08/02/2021 and is considered benign. Likewise there is chronic scarring in the lingula. Hepatobiliary: Contracted gallbladder. Gas along the porta hepatis and falciform ligament is favored to be extraluminal intraperitoneal gas rather than pneumobilia. No biliary dilatation. Pancreas: Notable pancreatic atrophy aside from the pancreatic head. Spleen: Unremarkable Adrenals/Urinary Tract: Substantial cystocele noted with pelvic floor laxity. The kidneys and adrenal glands appear unremarkable. Stomach/Bowel: Large anterior pyloric ulcer on image 29 series 3 with Thedford Bunton small amount of nearby extraluminal gas along the falciform ligament and porta hepatis suspicious for early perforation of Joeli Fenner pyloric channel ulcer. Surgical consultation recommended. There is abnormal wall thickening throughout the colon the exception of the cecum, compatible with nonspecific colitis. No pneumatosis of the colon is currently identified. Vascular/Lymphatic: Atherosclerosis is present, including aortoiliac atherosclerotic disease. No findings of occlusion of the celiac trunk, SMA, or IMA. Reproductive: Uterus not visualized, suspected vaginal prolapse. Other: Abnormal ascites particularly in the perihepatic region and right  paracolic gutter. Trace edema in the omentum diffusely. Musculoskeletal: Right upper abdominal paracentral hernia containing adipose tissue and edema shown on image 30 series 3, the hernia extends between the oblique musculature and transverse abdominis. Lumbar spondylosis and degenerative disc disease causing left foraminal impingement at L4-5 and L5-S1. IMPRESSION: 1. Large anterior pyloric channel ulcer with Hart Haas small amount of nearby intraperitoneal gas along the falciform ligament and porta hepatis suspicious for early perforation of Keyarah Mcroy pyloric channel ulcer. Ascites noted eccentric to the right. Surgical consultation recommended. 2. Abnormal wall thickening throughout the colon the exception of the cecum, compatible with nonspecific colitis. No pneumatosis of the colon is currently identified. 3. Abnormal ascites particularly in the perihepatic region and right paracolic gutter. Trace edema in the omentum diffusely. 4. Right upper abdominal paracentral hernia containing adipose tissue and edema in between the transverse abdominus and oblique musculature. 5. Substantial cystocele with pelvic floor laxity. Likely vaginal prolapse. Low position of the anorectal junction. 6. Lumbar spondylosis and degenerative disc disease causing left foraminal impingement at L4-5 and L5-S1. 7.  Aortic Atherosclerosis (ICD10-I70.0). Radiology assistant personnel have been notified to put me in telephone contact with the referring physician or the referring physician's clinical representative in order to discuss these findings. Once this communication is established I will issue an addendum to this report for documentation purposes. Electronically Signed: By: Ryan Salvage M.D. On: 07/01/2024 21:08   DG Chest Portable 1 View Result Date: 07/01/2024 CLINICAL DATA:  Sepsis. EXAM: PORTABLE CHEST 1 VIEW COMPARISON:  None Available. FINDINGS: The heart size and mediastinal contours are within normal limits. Right lung is clear.  Minimal left basilar subsegmental atelectasis is noted. The visualized skeletal structures are unremarkable. IMPRESSION: Minimal left basilar subsegmental atelectasis. Electronically Signed   By: Lynwood Landy Raddle M.D.   On: 07/01/2024 18:45        Scheduled Meds:  sodium chloride    Intravenous Once   enoxaparin  (LOVENOX ) injection  40 mg Subcutaneous Daily   hydrocortisone  sod succinate (SOLU-CORTEF ) inj  100 mg Intravenous Q8H   pantoprazole  (PROTONIX ) IV  40 mg Intravenous Q12H   Continuous Infusions:  micafungin  (MYCAMINE ) 100 mg in sodium chloride  0.9 % 100 mL IVPB Stopped (07/02/24 0630)   piperacillin -tazobactam (ZOSYN )  IV 3.375 g (07/03/24 0559)     LOS: 1 day    Time spent: over 30 min    Meliton Monte, MD [facility]   To contact the attending provider between  7A-7P or the covering provider during after hours 7P-7A, please log into the web site [email or web address] and access using universal [place] password for that web site. If you do not have the password, please call the hospital operator.  07/03/2024, 9:22 AM

## 2024-07-03 NOTE — Progress Notes (Signed)
 Foley catheter discontinued at 0640. Patient tolerated well.

## 2024-07-03 NOTE — Consult Note (Signed)
 WOC Nurse Consult Note: Reason for Consult:sacral wound Wound type: Stage 3 Pressure Injury: right buttock Stage 3 Pressure Injury; left buttock Pressure Injury POA: Yes/ Measurement: see nursing flow sheets Wound azi:rozjw, pink Drainage (amount, consistency, odor) see nursing flow sheets Periwound: intact Dressing procedure/placement/frequency: Cleanse bilateral buttock wounds with saline, pat dry Apply hydrogel to each wound, Lujean [phone]) Cover with foam, change daily   Turn and reposition per hospital policy LALM in place for moisture management and pressure redistribution    Re consult if needed, will not follow at this time. Thanks  Tracy Gerken [employer], RN,CWOCN, CNS, CWON-AP [phone])

## 2024-07-03 NOTE — Progress Notes (Signed)
 2 Days Post-Op  Subjective: CC: Pain around her incision with movement. No pain at rest. No nausea. NGT in place with 130cc/24 hours and bilious. Small dark BM yesterday. Foley out this AM. She has not voided since foley removal. On 1L o2. Does not smoke or wear o2 at home. No cp, sob, or cough. Pulling between > 500 on IS. Worked with PT yesterday.    Afebrile. No tachycardia or hypotension. WBC 13.6   Objective: Vital signs in last 24 hours: Temp:  [97.5 F (36.4 C)-97.9 F (36.6 C)] 97.5 F (36.4 C) (07/13 0728) Pulse Rate:  [62-84] 73 (07/13 0728) Resp:  [11-27] 18 (07/13 0728) BP: (103-125)/(62-83) 113/79 (07/13 0728) SpO2:  [93 %-97 %] 96 % (07/13 0728) Last BM Date : 07/01/24  Intake/Output from previous day: 07/12 0701 - 07/13 0700 In: 2746.7 [I.V.:1889.7; IV Piggyback:857] Out: 1105 [Urine:650; Emesis/NG output:130; Drains:325] Intake/Output this shift: No intake/output data recorded.  PE: Gen:  Alert, NAD, pleasant HEENT:  NGT in place on LIWS Card:  Reg Pulm:  Rate and effort normal Abd: Soft, mild distension, appropriately tender around incision, no rigidity or guarding and otherwise NT, +BS. Incision with honeycomb dressing in place, cdi. JP drain SS - 325cc/24 hours.  Ext:  No LE edema  Psych: A&Ox3   Lab Results:  Recent Labs    07/02/24 0629 07/03/24 0658  WBC 10.0 13.6*  HGB 8.3* 9.8*  HCT 26.6* 31.8*  PLT 164 206   BMET Recent Labs    07/02/24 0629 07/03/24 0658  NA 133* 133*  K 3.1* 4.0  CL 101 101  CO2 23 23  GLUCOSE 139* 73  BUN 16 17  CREATININE 0.37* 0.52  CALCIUM  7.7* 8.7*   PT/INR Recent Labs    07/01/24 1821  LABPROT 16.1*  INR 1.2   CMP     Component Value Date/Time   NA 133 (L) 07/03/2024 0658   K 4.0 07/03/2024 0658   CL 101 07/03/2024 0658   CO2 23 07/03/2024 0658   GLUCOSE 73 07/03/2024 0658   BUN 17 07/03/2024 0658   CREATININE 0.52 07/03/2024 0658   CALCIUM  8.7 (L) 07/03/2024 0658   PROT 4.3 (L)  07/02/2024 0629   ALBUMIN  1.9 (L) 07/02/2024 0629   AST 13 (L) 07/02/2024 0629   ALT 16 07/02/2024 0629   ALKPHOS 43 07/02/2024 0629   BILITOT 0.8 07/02/2024 0629   GFRNONAA >60 07/03/2024 0658   Lipase     Component Value Date/Time   LIPASE <10 (L) 08/02/2021 2201    Studies/Results: CT ABDOMEN PELVIS W CONTRAST Addendum Date: 07/01/2024 ADDENDUM REPORT: 07/01/2024 21:23 ADDENDUM: The original report was by Dr. Ryan Salvage. The following addendum is by Dr. Ryan Salvage: Critical Value/emergent results were called by telephone at the time of interpretation on 07/01/2024 at 9:15 pm to provider Dr. Garrick, who verbally acknowledged these results. Electronically Signed   By: Ryan Salvage M.D.   On: 07/01/2024 21:23   Result Date: 07/01/2024 CLINICAL DATA:  Acute abdominal pain. History of ulcerative colitis and irritable bowel syndrome EXAM: CT ABDOMEN AND PELVIS WITH CONTRAST TECHNIQUE: Multidetector CT imaging of the abdomen and pelvis was performed using the standard protocol following bolus administration of intravenous contrast. RADIATION DOSE REDUCTION: This exam was performed according to the departmental dose-optimization program which includes automated exposure control, adjustment of the mA and/or kV according to patient size and/or use of iterative reconstruction technique. CONTRAST:  60mL OMNIPAQUE  IOHEXOL  350 MG/ML SOLN COMPARISON:  05/19/2024 FINDINGS: Lower chest: Scarring and pleural-based nodularity posteriorly in the right lower lobe is not changed from 08/02/2021 and is considered benign. Likewise there is chronic scarring in the lingula. Hepatobiliary: Contracted gallbladder. Gas along the porta hepatis and falciform ligament is favored to be extraluminal intraperitoneal gas rather than pneumobilia. No biliary dilatation. Pancreas: Notable pancreatic atrophy aside from the pancreatic head. Spleen: Unremarkable Adrenals/Urinary Tract: Substantial cystocele noted with  pelvic floor laxity. The kidneys and adrenal glands appear unremarkable. Stomach/Bowel: Large anterior pyloric ulcer on image 29 series 3 with a small amount of nearby extraluminal gas along the falciform ligament and porta hepatis suspicious for early perforation of a pyloric channel ulcer. Surgical consultation recommended. There is abnormal wall thickening throughout the colon the exception of the cecum, compatible with nonspecific colitis. No pneumatosis of the colon is currently identified. Vascular/Lymphatic: Atherosclerosis is present, including aortoiliac atherosclerotic disease. No findings of occlusion of the celiac trunk, SMA, or IMA. Reproductive: Uterus not visualized, suspected vaginal prolapse. Other: Abnormal ascites particularly in the perihepatic region and right paracolic gutter. Trace edema in the omentum diffusely. Musculoskeletal: Right upper abdominal paracentral hernia containing adipose tissue and edema shown on image 30 series 3, the hernia extends between the oblique musculature and transverse abdominis. Lumbar spondylosis and degenerative disc disease causing left foraminal impingement at L4-5 and L5-S1. IMPRESSION: 1. Large anterior pyloric channel ulcer with a small amount of nearby intraperitoneal gas along the falciform ligament and porta hepatis suspicious for early perforation of a pyloric channel ulcer. Ascites noted eccentric to the right. Surgical consultation recommended. 2. Abnormal wall thickening throughout the colon the exception of the cecum, compatible with nonspecific colitis. No pneumatosis of the colon is currently identified. 3. Abnormal ascites particularly in the perihepatic region and right paracolic gutter. Trace edema in the omentum diffusely. 4. Right upper abdominal paracentral hernia containing adipose tissue and edema in between the transverse abdominus and oblique musculature. 5. Substantial cystocele with pelvic floor laxity. Likely vaginal prolapse. Low  position of the anorectal junction. 6. Lumbar spondylosis and degenerative disc disease causing left foraminal impingement at L4-5 and L5-S1. 7.  Aortic Atherosclerosis (ICD10-I70.0). Radiology assistant personnel have been notified to put me in telephone contact with the referring physician or the referring physician's clinical representative in order to discuss these findings. Once this communication is established I will issue an addendum to this report for documentation purposes. Electronically Signed: By: Ryan Salvage M.D. On: 07/01/2024 21:08   DG Chest Portable 1 View Result Date: 07/01/2024 CLINICAL DATA:  Sepsis. EXAM: PORTABLE CHEST 1 VIEW COMPARISON:  None Available. FINDINGS: The heart size and mediastinal contours are within normal limits. Right lung is clear. Minimal left basilar subsegmental atelectasis is noted. The visualized skeletal structures are unremarkable. IMPRESSION: Minimal left basilar subsegmental atelectasis. Electronically Signed   By: Lynwood Landy Raddle M.D.   On: 07/01/2024 18:45    Anti-infectives: Anti-infectives (From admission, onward)    Start     Dose/Rate Route Frequency Ordered Stop   07/02/24 1000  piperacillin -tazobactam (ZOSYN ) IVPB 3.375 g        3.375 g 12.5 mL/hr over 240 Minutes Intravenous Every 8 hours 07/02/24 0440     07/02/24 0500  micafungin  (MYCAMINE ) 100 mg in sodium chloride  0.9 % 100 mL IVPB        100 mg 105 mL/hr over 1 Hours Intravenous Daily 07/02/24 0315     07/02/24 0145  piperacillin -tazobactam (ZOSYN ) IVPB 3.375 g  3.375 g 100 mL/hr over 30 Minutes Intravenous  Once 07/02/24 0134 07/02/24 0354   07/01/24 2237  ceFAZolin  (ANCEF ) 2-4 GM/100ML-% IVPB       Note to Pharmacy: Roddie Grate: cabinet override      07/01/24 2237 07/02/24 1044        Assessment/Plan POD 1 s/p Exploratory laparotomy with [name] repair of perforated peptic ulcer by Dr. Teresa on 07/02/24 for Perforated pyloric channel peptic ulcer  -  Cont NGT to LIWS, nothing per tube, strict NPO, plan UGI POD #3 - Cont JP. SS - Cont IV abx and antifungals, plan 5d post op - Cont BID PPI - Check H. Pylori  - PRN IV pain meds. Avoid NSAIDs - Mobilize, PT - Pulm toilet  FEN - NPO, NGT to LIWS, IVF.  VTE - SCDs, Lovenox   ID - Zosyn , Micafungin   Foley - TOV 7/13 Plan - As above.   Hx of UC on 40 mg prednisone  per day - on solucortef currently. TRH consult to assist with stress dose steroid dosing. Hypothyroidism - TRH recommends if patient will be npo x >72 hours to switch to IV synthroid   Hematuria - TRH recommends repeating UA sometime later in course to ensure clearance of microscopic hematuria     LOS: 1 day    Ozell CHRISTELLA Shaper, [facility] Surgery 07/03/2024, 9:48 AM Please see Amion for pager number during day hours 7:00am-4:30pm

## 2024-07-03 NOTE — Evaluation (Signed)
 Occupational Therapy Evaluation Patient Details Name: Allison Finley MRN: [id number] DOB: 1951/05/03 Today's Date: 07/03/2024   History of Present Illness   Pt is a 73 y.o. female who presented 07/01/24 with severe abdominal pain. Pt found to have perforated pyloric channel peptic ulcer, s/p ex lap with repair 7/12. PMH: ulcerative colitis     Clinical Impressions Patient admitted for the diagnosis above.  PTA she lives at home and states Mod I for bathing, dressing, light iADL and mobility at 4WRW, but steadily became more weak over the last two weeks.  Deficits impacting independence are listed below, currently she is needing up to Mod A for ADL completion, bed mobility and simple sit to stand transfers.  OT will follow in the acute setting to address deficits, and Patient will benefit from intensive inpatient follow-up therapy, >3 hours/day.     If plan is discharge home, recommend the following:   Assist for transportation;Assistance with cooking/housework;A lot of help with bathing/dressing/bathroom;A little help with walking and/or transfers     Functional Status Assessment   Patient has had a recent decline in their functional status and demonstrates the ability to make significant improvements in function in a reasonable and predictable amount of time.     Equipment Recommendations   None recommended by OT     Recommendations for Other Services         Precautions/Restrictions   Precautions Precautions: Fall Precaution/Restrictions Comments: NGT; abdominal precautions; R JP drain Restrictions Weight Bearing Restrictions Per Provider Order: No     Mobility Bed Mobility Overal bed mobility: Needs Assistance Bed Mobility: Sidelying to Sit   Sidelying to sit: Mod assist         Patient Response: Cooperative  Transfers Overall transfer level: Needs assistance Equipment used: Rolling walker (2 wheels) Transfers: Sit to/from Stand, Bed to  chair/wheelchair/BSC Sit to Stand: Mod assist     Step pivot transfers: Contact guard assist            Balance Overall balance assessment: Needs assistance Sitting-balance support: No upper extremity supported, Feet supported Sitting balance-Leahy Scale: Good     Standing balance support: Reliant on assistive device for balance Standing balance-Leahy Scale: Poor                             ADL either performed or assessed with clinical judgement   ADL Overall ADL's : Needs assistance/impaired Eating/Feeding: NPO   Grooming: Wash/dry hands;Contact guard assist;Standing   Upper Body Bathing: Set up;Sitting   Lower Body Bathing: Moderate assistance;Sit to/from stand   Upper Body Dressing : Supervision/safety;Sitting   Lower Body Dressing: Moderate assistance;Sit to/from stand   Toilet Transfer: Minimal assistance;Ambulation;Regular Toilet;Rolling walker (2 wheels)   Toileting- Clothing Manipulation and Hygiene: Contact guard assist;Sitting/lateral lean               Vision Patient Visual Report: No change from baseline       Perception Perception: Not tested       Praxis Praxis: Not tested       Pertinent Vitals/Pain Pain Assessment Pain Assessment: Faces Faces Pain Scale: Hurts little more Pain Location: L abdomen Pain Descriptors / Indicators: Discomfort, Grimacing, Guarding, Operative site guarding Pain Intervention(s): Monitored during session     Extremity/Trunk Assessment Upper Extremity Assessment Upper Extremity Assessment: Generalized weakness   Lower Extremity Assessment Lower Extremity Assessment: Defer to PT evaluation   Cervical / Trunk Assessment Cervical / Trunk Assessment:  Other exceptions Cervical / Trunk Exceptions: abdominal incisions   Communication Communication Communication: No apparent difficulties   Cognition Arousal: Alert Behavior During Therapy: WFL for tasks assessed/performed Cognition: No  apparent impairments                               Following commands: Intact       Cueing  General Comments   Cueing Techniques: Verbal cues      Exercises     Shoulder Instructions      Home Living Family/patient expects to be discharged to:: Private residence Living Arrangements: Alone Available Help at Discharge: Family;Available PRN/intermittently Type of Home: House Home Access: Stairs to enter [employer] of Steps: 4 Entrance Stairs-Rails: Right;Left Home Layout: One level     Bathroom Shower/Tub: Chief Strategy Officer: Standard     Home Equipment: Rollator (4 wheels);Cane - single point;Toilet riser;Tub bench;Grab bars - tub/shower          Prior Functioning/Environment Prior Level of Function : Independent/Modified Independent;Driving;Working/employed             Mobility Comments: Was IND without DME until last week or so when pain increased and pt began to use SPC vs rollator as needed ADLs Comments: works part time at [employer]    OT Problem List: Decreased strength;Decreased activity tolerance;Impaired balance (sitting and/or standing);Pain   OT Treatment/Interventions: Self-care/ADL training;Therapeutic activities;Patient/family education;DME and/or AE instruction;Energy conservation;Balance training      OT Goals(Current goals can be found in the care plan section)   Acute Rehab OT Goals Patient Stated Goal: Return home OT Goal Formulation: With patient Time For Goal Achievement: 07/18/24 Potential to Achieve Goals: Good ADL Goals Pt Will Perform Grooming: with modified independence;standing Pt Will Perform Lower Body Bathing: with modified independence;sit to/from stand Pt Will Perform Lower Body Dressing: with modified independence;sit to/from stand Pt Will Transfer to Toilet: with modified independence;regular height toilet;ambulating   OT Frequency:  Min 2X/week    Co-evaluation               AM-PAC OT 6 Clicks Daily Activity     Outcome Measure Help from another person eating meals?: Total Help from another person taking care of personal grooming?: A Little Help from another person toileting, which includes using toliet, bedpan, or urinal?: A Lot Help from another person bathing (including washing, rinsing, drying)?: A Lot Help from another person to put on and taking off regular upper body clothing?: A Little Help from another person to put on and taking off regular lower body clothing?: A Lot 6 Click Score: 13   End of Session Equipment Utilized During Treatment: Gait belt;Oxygen Nurse Communication: Mobility status  Activity Tolerance: Patient tolerated treatment well Patient left: in chair;with call bell/phone within reach;with chair alarm set  OT Visit Diagnosis: Unsteadiness on feet (R26.81);Muscle weakness (generalized) (M62.81)                Time: 9084-9051 OT Time Calculation (min): 33 min Charges:  OT General Charges $OT Visit: 1 Visit OT Evaluation $OT Eval Moderate Complexity: 1 Mod OT Treatments $Self Care/Home Management : 8-22 mins  07/03/2024  RP, OTR/L  Acute Rehabilitation Services  Office:  [phone]   Allison Finley 07/03/2024, 9:51 AM

## 2024-07-03 NOTE — Consult Note (Signed)
 Please note that the [facility] nursing team is utilizing a standardized work plan to manage patient consults. We are triaging consults and will try to see the patients within 48 hours. Wound photos in the patient's chart allow [place] to consult on the patient in the most efficient and timely manner.    Guneet Delpino [facility], CNS, [employer] [phone]

## 2024-07-04 DIAGNOSIS — R5381 Other malaise: Secondary | ICD-10-CM

## 2024-07-04 DIAGNOSIS — K3189 Other diseases of stomach and duodenum: Secondary | ICD-10-CM | POA: Diagnosis not present

## 2024-07-04 LAB — CBC
HCT: 34 % — ABNORMAL LOW (ref 36.0–46.0)
Hemoglobin: 10.3 g/dL — ABNORMAL LOW (ref 12.0–15.0)
MCH: 27.8 pg (ref 26.0–34.0)
MCHC: 30.3 g/dL (ref 30.0–36.0)
MCV: 91.6 fL (ref 80.0–100.0)
Platelets: 206 K/uL (ref 150–400)
RBC: 3.71 MIL/uL — ABNORMAL LOW (ref 3.87–5.11)
RDW: 18.2 % — ABNORMAL HIGH (ref 11.5–15.5)
WBC: 9.5 K/uL (ref 4.0–10.5)
nRBC: 0 % (ref 0.0–0.2)

## 2024-07-04 LAB — BASIC METABOLIC PANEL WITH GFR
Anion gap: 12 (ref 5–15)
BUN: 18 mg/dL (ref 8–23)
CO2: 20 mmol/L — ABNORMAL LOW (ref 22–32)
Calcium: 8.4 mg/dL — ABNORMAL LOW (ref 8.9–10.3)
Chloride: 102 mmol/L (ref 98–111)
Creatinine, Ser: 0.58 mg/dL (ref 0.44–1.00)
GFR, Estimated: >60 mL/min (ref >60)
Glucose, Bld: 55 mg/dL — ABNORMAL LOW (ref 70–99)
Potassium: 4 mmol/L (ref 3.5–5.1)
Sodium: 134 mmol/L — ABNORMAL LOW (ref 135–145)

## 2024-07-04 LAB — MAGNESIUM: Magnesium: 2 mg/dL (ref 1.7–2.4)

## 2024-07-04 LAB — PHOSPHORUS: Phosphorus: 3.4 mg/dL (ref 2.5–4.6)

## 2024-07-04 MED ORDER — ACETAMINOPHEN 10 MG/ML IV SOLN
1000.0000 mg | Freq: Four times a day (QID) | INTRAVENOUS | Status: AC
  Administered 2024-07-04 – 2024-07-05 (×3): 1000 mg via INTRAVENOUS
  Filled 2024-07-04 (×3): qty 100

## 2024-07-04 NOTE — Plan of Care (Signed)

## 2024-07-04 NOTE — Progress Notes (Signed)
 Inpatient Rehab Coordinator Note:  I met with patient at bedside, and spoke to daughter on phone  to discuss CIR recommendations and goals/expectations of CIR stay.  We reviewed 3 hrs/day of therapy, physician follow up, and average length of stay 2 weeks (dependent upon progress) with goals of mod I. Daughter, Mliss reports she and other family members will be able to provide 24/7 supervision. Will submit to insurance once all updated notes are in.   Rehab Admissons Coordinator Keyry Iracheta, [place], [place] [phone]

## 2024-07-04 NOTE — PMR Pre-admission (Shared)
 PMR Admission Coordinator Pre-Admission Assessment  Patient: Allison Finley is an 73 y.o., female MRN: [id number] DOB: [birth year] Height: 5' 2 (157.5 cm) Weight: 60.1 kg             Insurance Information HMO: yes    PPO:      PCP:      IPA:      80/20:      OTHER:  PRIMARY: [employer]      Policy#: [id number]      Subscriber: patient CM Name: ***      Phone#: [phone]     Fax#: [phone] Pre-Cert#: [id number]      Employer:  Benefits:  Phone #: online at [email or web address]     Name: *** Eff. Date: 12/23/23     Deduct: does not have one      Out of Pocket Max: $6,750 [phone] met)        CIR: $399/day co pay for max co pay of $2,793/admission ( 7 days)      SNF: $10/day for days 1-20 Outpatient: $25 copay/visit      Home Health: 100% coverage       DME: 80% coverage      Providers: in network  SECONDARY: none  Financial Counselor:       Phone#:   The Engineer, materials Information Summary" for patients in Inpatient Rehabilitation Facilities with attached "Privacy Act Statement-Health Care Records" was provided and verbally reviewed with: Patient and Family  Emergency Contact Information Contact Information     Name Relation Home Work Mobile   [place] Daughter   [phone]      Other Contacts   None on File    Current Medical History  Patient Admitting Diagnosis: Gastric perforation  History of Present Illness: Pt is a 73 y.o. female with past medical history of hypothyroidism and iron  deficiency anemia who presented 07/01/24 with severe abdominal pain.  Recently diagnosed th ulcerative colitis and on steroids.   Pt found to have perforated pyloric channel peptic ulcer, s/p ex lap with repair 7/12. Patient has continued on steroids for her ulcerative colitis. Placed on Zosyn  and micafungin . Upper GI without gastric leak. Positive H pylori and placed on Doxy, Flagyl  bismuth  and PPI. GI consulted and planning for infliximab  in 2 weeks as an  outpatient. Continue prednisone . Holding lovenox  for has bright red blood per her rectum on 7/17 with her diarrhea. Serial CBCs and to transfuse if needed. Vaginal prolapse with some bleeding.   Sacral decubitus managed by WOC.  Severe protein-calorie malnutrition followed by dietician . She is a vegetarian and taking supplement.   Patient's medical record from [name] has been reviewed by the rehabilitation admission coordinator and physician.  Past Medical History  Past Medical History:  Diagnosis Date   Cystocele, unspecified    Diverticulosis    GERD (gastroesophageal reflux disease)    Hematochezia    Hypothyroid    Inflammatory bowel disease (ulcerative colitis) (HCC) 05/20/2024   Iron  deficiency anemia due to chronic blood loss    Psoriasis    Has the patient had major surgery during 100 days prior to admission? Yes  Family History  family history includes COPD in her brother; Hearing loss in her brother and mother; Hypertension in her brother; Rheum arthritis in her mother.  Current Medications   Current Facility-Administered Medications:    0.9 %  sodium chloride  infusion (Manually program via Guardrails IV Fluids), , Intravenous, Once, Golob, Jamie C., CRNA   acetaminophen  (TYLENOL ) tablet  1,000 mg, 1,000 mg, Oral, Q6H PRN, Maczis, Michael M, PA-C   ascorbic acid  (VITAMIN C ) tablet 500 mg, 500 mg, Oral, BID, Johnson, Kelly R, PA-C, 500 mg at 07/13/24 1014   bismuth  subsalicylate (PEPTO BISMOL) 262 MG/15ML suspension 30 mL, 30 mL, Oral, TID AC & HS, Johnson, Kelly R, PA-C, 30 mL at 07/13/24 0546   diphenhydrAMINE  (BENADRYL ) 12.5 MG/5ML elixir 12.5 mg, 12.5 mg, Oral, Q6H PRN **OR** [DISCONTINUED] diphenhydrAMINE  (BENADRYL ) injection 12.5 mg, 12.5 mg, Intravenous, Q6H PRN, White, Lonni HERO, MD   feeding supplement (KATE FARMS STANDARD 1.4) liquid 325 mL, 325 mL, Oral, TID BM, Vicci Burnard SAUNDERS, PA-C, 325 mL at 07/13/24 1014   ferrous sulfate  tablet 325 mg, 325 mg, Oral,  BID WC, Johnson, Kelly R, PA-C, 325 mg at 07/13/24 9147   fiber supplement (BANATROL TF) liquid 60 mL, 60 mL, Oral, BID, Maczis, Michael M, PA-C, 60 mL at 07/13/24 1013   hydrocortisone  (CORTENEMA ) enema 100 mg, 100 mg, Rectal, QHS, McGreal, Inocente HERO, MD, 100 mg at 07/12/24 2111   HYDROmorphone  (DILAUDID ) injection 0.5 mg, 0.5 mg, Intravenous, Q2H PRN, Vicci Burnard SAUNDERS, PA-C, 0.5 mg at 07/03/24 1432   levothyroxine  (SYNTHROID ) tablet 112 mcg, 112 mcg, Oral, Daily, Odell Celinda Balo, MD, 112 mcg at 07/13/24 0545   melatonin tablet 3 mg, 3 mg, Oral, QHS, Odell Celinda Balo, MD, 3 mg at 07/12/24 2109   metoprolol  tartrate (LOPRESSOR ) injection 5 mg, 5 mg, Intravenous, Q6H PRN, Teresa Lonni HERO, MD   metroNIDAZOLE  (FLAGYL ) tablet 500 mg, 500 mg, Oral, Q8H, Vicci Burnard SAUNDERS, PA-C, 500 mg at 07/13/24 0545   multivitamin with minerals tablet 1 tablet, 1 tablet, Oral, Daily, Paola Dreama SAILOR, MD, 1 tablet at 07/13/24 1014   ondansetron  (ZOFRAN -ODT) disintegrating tablet 4 mg, 4 mg, Oral, Q6H PRN **OR** ondansetron  (ZOFRAN ) injection 4 mg, 4 mg, Intravenous, Q6H PRN, Teresa Lonni HERO, MD   pantoprazole  (PROTONIX ) injection 40 mg, 40 mg, Intravenous, Q12H, Teresa Lonni HERO, MD, 40 mg at 07/13/24 1014   predniSONE  (DELTASONE ) tablet 20 mg, 20 mg, Oral, BID WC, Odell Celinda Balo, MD, 20 mg at 07/13/24 9147   tetracycline  (SUMYCIN ) capsule 500 mg, 500 mg, Oral, QID, Rollin Dover, MD, 500 mg at 07/13/24 1013   traMADol  (ULTRAM ) tablet 50-100 mg, 50-100 mg, Oral, Q6H PRN, Vicci Burnard SAUNDERS, PA-C   zinc  sulfate (50mg  elemental zinc ) capsule 220 mg, 220 mg, Oral, Daily, Lovick, Dreama SAILOR, MD, 220 mg at 07/13/24 1014  Patients Current Diet:  Diet Order             Diet regular Room service appropriate? Yes; Fluid consistency: Thin  Diet effective now                  Precautions / Restrictions Precautions Precautions: Fall Precaution/Restrictions Comments: abdominal  precautions Restrictions Weight Bearing Restrictions Per Provider Order: No   Has the patient had 2 or more falls or a fall with injury in the past year?No  Prior Activity Level Community (5-7x/wk): indpendent without AD and working part time  Prior Functional Level Prior Function Prior Level of Function : Independent/Modified Independent, Driving, Working/employed Mobility Comments: Was IND without DME until last week or so when pain increased and pt began to use SPC vs rollator as needed ADLs Comments: works part time at [employer]  Self Care: Did the patient need help bathing, dressing, using the toilet or eating?  Independent  Indoor Mobility: Did the patient need assistance with walking from room to  room (with or without device)? Independent  Stairs: Did the patient need assistance with internal or external stairs (with or without device)? Independent  Functional Cognition: Did the patient need help planning regular tasks such as shopping or remembering to take medications? Independent  Patient Information Are you of Hispanic, Latino/a,or Spanish origin?: A. No, not of Hispanic, Latino/a, or Spanish origin What is your race?: A. White Do you need or want an interpreter to communicate with a doctor or health care staff?: 0. No  Patient's Response To:  Health Literacy and Transportation Is the patient able to respond to health literacy and transportation needs?: Yes Health Literacy - How often do you need to have someone help you when you read instructions, pamphlets, or other written material from your doctor or pharmacy?: Never In the past 12 months, has lack of transportation kept you from medical appointments or from getting medications?: No In the past 12 months, has lack of transportation kept you from meetings, work, or from getting things needed for daily living?: No  Journalist, newspaper / Equipment Home Equipment: Rollator (4 wheels), Cane - single point, Toilet  riser, Tub bench, Grab bars - tub/shower  Prior Device Use: Indicate devices/aids used by the patient prior to current illness, exacerbation or injury? None of the above  Current Functional Level Cognition  Orientation Level: Oriented X4    Extremity Assessment (includes Sensation/Coordination)  Upper Extremity Assessment: Generalized weakness  Lower Extremity Assessment: Defer to PT evaluation    ADLs  Overall ADL's : Needs assistance/impaired Eating/Feeding: Set up, Sitting Grooming: Set up, Sitting Upper Body Bathing: Set up, Sitting Lower Body Bathing: Sit to/from stand, Maximal assistance Upper Body Dressing : Supervision/safety, Sitting Lower Body Dressing: Maximal assistance, Sit to/from stand Toilet Transfer: Moderate assistance, Tax adviser Details (indicate cue type and reason): max A for STS, once standing with RW pt is CGA Toileting- Clothing Manipulation and Hygiene: Sitting/lateral lean Functional mobility during ADLs: Maximal assistance, Rolling walker (2 wheels) General ADL Comments: increased global weakness, required more assist for transfers. poor activity tolerance for ADLs and mobility. Dizziness noted with transition, BP stable, no nystagmus    Mobility  Overal bed mobility: Needs Assistance Bed Mobility: Supine to Sit Rolling: Min assist Sidelying to sit: Mod assist Supine to sit: Mod assist General bed mobility comments: up in recliner and wanted to stay OOB    Transfers  Overall transfer level: Needs assistance Equipment used: Rolling walker (2 wheels) Transfers: Sit to/from Stand Sit to Stand: Mod assist, +2 physical assistance Bed to/from chair/wheelchair/BSC transfer type:: Step pivot Step pivot transfers: Min assist, Mod assist General transfer comment: attempted +1 assist from chair with bil UEs pushing off armrests with hips cleared but unable to extend hips/knees fully; required +2    Ambulation / Gait / Stairs / Wheelchair  Mobility  Ambulation/Gait Ambulation/Gait assistance: Min assist, +2 safety/equipment Gait Distance (Feet): 110 Feet Assistive device: Rolling walker (2 wheels) Gait Pattern/deviations: Step-through pattern, Decreased stride length, Narrow base of support, Trunk flexed General Gait Details: close chair follow for pt comfort/confidence;  cues for placement in RW (tends to get too close to the front), and increasing foot clearance and heel-toe gait Gait velocity: decr Gait velocity interpretation: <1.31 ft/sec, indicative of household ambulator    Posture / Balance Balance Overall balance assessment: Needs assistance Sitting-balance support: No upper extremity supported, Feet supported Sitting balance-Leahy Scale: Fair Standing balance support: Reliant on assistive device for balance, Bilateral upper extremity supported Standing balance-Leahy Scale:  Poor Standing balance comment: reliant on RW    Special needs/care consideration     Allison Stephane KIDD, RN  Registered Nurse WOC   Consult Note    Addendum   Date of Service: 07/08/2024  8:04 AM    Show:Clear all [x] Written[x] Templated[x] Copied  Added by: [x] Allison Stephane KIDD, RN  [] Hover for details WOC Nurse Re-consult Note: Reason for Consult: Refer to previous WOC consult performed 7/13.   Requested to re-assess buttocks wounds. Consult performed remotely after review of progress notes and photos in the EMR.  Pt is having GI bleeding and this is occurring in close proximity to the wounds and it is difficult to keep them from soiling and promote healing.  Bilat buttocks with red moist Stage 3 pressure injuries Pressure Injury POA: Yes Dressing procedure/placement/frequency: Since the wounds are remaining moist, I will discontinue the hydrogel and begin Aquacel to absorb drainage and provide antimicrobial benefits.  Topical treatment orders provided for bedside nurses to perform as follows: Cleanse bilateral buttock wounds with  saline, pat dry Apply a piece of Aquacel Soila # [phone]) over each wound, then cover with foam dressing.  Change foam Q 3 days or PRN soiling. Please re-consult if further assistance is needed.  Thank-you,  Stephane Sherree MSN, RN, CWOCN, CWCN-AP, CNS Contact Mon-Fri 0700-1500: [phone]          Revision History        Previous Home Environment  Living Arrangements: Alone Available Help at Discharge: Family, Available 24 hours/day Type of Home: House Home Layout: One level Home Access: Stairs to enter Entrance Stairs-Rails: Right, Left Entrance Stairs-Number of Steps: 4 Bathroom Shower/Tub: Armed forces operational officer Accessibility: Yes  Discharge Living Setting Plans for Discharge Living Setting: Patient's home Type of Home at Discharge: House Discharge Home Layout: One level Discharge Home Access: Stairs to enter Entrance Stairs-Rails: Right, Left Entrance Stairs-Number of Steps: 4 Discharge Bathroom Shower/Tub: Tub/shower unit Discharge Bathroom Toilet: Standard Discharge Bathroom Accessibility: Yes Does the patient have any problems obtaining your medications?: No  Social/Family/Support Systems Anticipated Caregiver: Daughter, Mliss Aquas Anticipated Caregiver's Contact Information: [phone] Caregiver Availability: 24/7 Discharge Plan Discussed with Primary Caregiver: Yes Is Caregiver In Agreement with Plan?: Yes Does Caregiver/Family have Issues with Lodging/Transportation while Pt is in Rehab?: No  Goals Patient/Family Goal for Rehab: Mod I to supervision with PT and OT Expected length of stay: 7-10 days Pt/Family Agrees to Admission and willing to participate: Yes Program Orientation Provided & Reviewed with Pt/Caregiver Including Roles  & Responsibilities: Yes  Barriers to Discharge: Insurance for SNF coverage  Decrease burden of Care through IP rehab admission: n/a  Possible need for SNF placement upon discharge:not  anticipated  Patient Condition: This patient's medical and functional status has changed since the consult dated 07/04/24 in which the Rehabilitation Physician determined and documented that the patient was potentially appropriate for intensive rehabilitative care in an inpatient rehabilitation facility. Issues have been addressed and update has been discussed with Dr. Babs and patient now appropriate for inpatient rehabilitation. Will admit to inpatient rehab today.   Preadmission Screen Completed By:  Alison Heron Lot, RN MSN 07/13/2024 11:00 AM ______________________________________________________________________   Discussed status with Dr. PIERRETTEon***at *** and received approval for admission today.  Admission Coordinator:  Alison Heron Lot OBIE MSN time***/Date***

## 2024-07-04 NOTE — Progress Notes (Addendum)
 Initial Nutrition Assessment  DOCUMENTATION CODES:   Severe malnutrition in context of acute illness/injury  INTERVENTION:   Monitor for diet advancement and add ONS as able: FLD- [employer] BID, Juven BID CLD - [employer] TID, Juven BID Recommend starting TPN in 24-48 hours even if pt advanced to CLD as pt is day 5 NPO/minimal intake with increased needs related to wounds and post op healing. TPN to titrate to meet 100% of estimated needs if started.  100 mg Thiamine  x 7 days and MVI with minerals daily when able  Pt will be at refeeding risk when nutrition initiated Monitor magnesium, potassium, and phosphorus daily for at least 3 days, MD to replete as needed  *Pt is vegetarian but still consumes dairy   NUTRITION DIAGNOSIS:   Severe Malnutrition related to acute illness as evidenced by energy intake < or equal to 50% for > or equal to 5 days, severe muscle depletion, severe fat depletion.   GOAL:   Patient will meet greater than or equal to 90% of their needs   MONITOR:   Diet advancement, Weight trends, I & O's, Labs, Skin  REASON FOR ASSESSMENT:   Consult Wound healing  ASSESSMENT:  72 y.o. female who presented 07/01/24 with severe abdominal pain. Pt found to have perforated pyloric channel peptic ulcer, s/p ex lap with repair 7/12. PMH: ulcerative colitis.  7/12 - s/p ex lap with [name] repair of perforated pyloric channel peptic ulcer   Pt resting in chair on visit, just finished working with PT. Pt reports a couple of weeks ago she was able to do ADL on her own however since the end of June/Beginning of July she has experienced progressive weakness. She states she was so weak that her grandson and daughter had to start taking care of her. 1 Month ago pt reports she was eating well with no issues and is upset that these events happened so fast. Was diagnosed with UC in the beginning of June 2025. During this time pt had tried to eat 3 small meals per day. Pt is  vegetarian but does eat dairy. Typically eats cereal, yogurt, rice and beans, kefir, and 1 Premier Protein shake per day.  Last reported meal was 7/9 which was 1 chewy protein bar with almond butter.   Pt does not know her current weight but she is sure she has lost weight. Stated UBW is 130 lbs. Asked RN to get updated standing/bed weight if possible. On exam pt with severe muscle and fat wasting. Qualifies for severe malnutrition, pt also with 2 stage 3 wounds on buttocks. Pt is POD2 of ex lap with [name] of perforated peptic ulcer and has had minimal nutrition in 5 days,   Recommend starting nutrition support in 24-48 hours. Discussed with surgery PA via [place]. Plan to conduct UGI tomorrow and see if pt can be advanced on a diet. RD recommended if pt advanced to clears, TPN should still be initiated.   Pt with no N/V. NGT to LIS having output. Pt had BM this morning, stool sample being sent off for H. pylori workup . Discussed nutritional plan with pt. She prefers to try [employer] first if advanced to FLD. Ok with [employer] on CLD.   Admit weight: 59 kg Current weight: 59 kg    Average Meal Intake: NPO   Intake/Output Summary (Last 24 hours) at 07/04/2024 1501 Last data filed at 07/04/2024 0600 Gross per 24 hour  Intake 303.66 ml  Output  445 ml  Net -141.34 ml   Drains/Lines: JP drain abdomen 95 ml x 24 hours NGT 350 ml x 24 hours   Nutritionally Relevant Medications: Scheduled Meds:  sodium chloride    Intravenous Once   enoxaparin  (LOVENOX ) injection  40 mg Subcutaneous Daily   methylPREDNISolone  (SOLU-MEDROL ) injection  40 mg Intravenous Daily   pantoprazole  (PROTONIX ) IV  40 mg Intravenous Q12H   Labs Reviewed: Sodium 134 Calcoum 8.4 Albumin  1.9 AST 13 Total protein 4.3 CBG ranges from 55-73 mg/dL over the last 24 hours No A1C   NUTRITION - FOCUSED PHYSICAL EXAM:  Flowsheet Row Most Recent Value  Orbital Region Moderate depletion  Upper Arm Region Severe  depletion  Thoracic and Lumbar Region Moderate depletion  Buccal Region Severe depletion  Temple Region Moderate depletion  Clavicle Bone Region Severe depletion  Clavicle and Acromion Bone Region Severe depletion  Scapular Bone Region Severe depletion  Dorsal Hand Severe depletion  Patellar Region Severe depletion  Anterior Thigh Region Severe depletion  Posterior Calf Region Severe depletion  Edema (RD Assessment) None  Hair Reviewed  Eyes Reviewed  Mouth Reviewed  Skin Reviewed  Nails Reviewed    Diet Order:   Diet Order             Diet NPO time specified  Diet effective now                   EDUCATION NEEDS:   Education needs have been addressed  Skin:  Skin Assessment: Skin Integrity Issues: Skin Integrity Issues:: Stage III Stage III: L and R buttocks  Last BM:  07/04/2024  Height:   Ht Readings from Last 1 Encounters:  07/01/24 5' 2 (1.575 m)    Weight:   Wt Readings from Last 1 Encounters:  07/01/24 59 kg    Ideal Body Weight:  50 kg  BMI:  Body mass index is 23.78 kg/m.  Estimated Nutritional Needs:   Kcal:  1500-1700 kcal  Protein:  80-100 gm  Fluid:  >1.5L/day   Olivia Kenning, RD Registered Dietitian  See Amion for more information

## 2024-07-04 NOTE — Consult Note (Signed)
 Consult NOTE    Allison Finley  FMW:[id number] DOB: [birth year] DOA: 07/01/2024 PCP: Lynwood Laneta ORN, PA-C  Chief Complaint  Patient presents with   Abdominal Pain    Brief Narrative:   73 yo with hhx ulcerative colitis, hypothyroidism, IDA, dyslipidemia and other medical issues presenting with perforated peptic ulcer now s/p graham patch repair on 07/02/2024.  Hospitalists were consulted for medical management.  Assessment & Plan:   Principal Problem:   Gastric perforation, acute  Acute gastric perforation s/p exlap/surgical repair  - s/p ex lap with graham patch repair of perforated peptic ulcer 7/12 - post op surgical care per primary team - currently with NGT to LIS.  Strict NPO.  Abx and antifungals per surgery.  BID PPI.  Pending H pylori workup.  See note.   Ulcerative colitis  - hx of recent flare 3 weeks ago with rectal bleeding  - CT with nonspecific colitis on CT - appreciate GI assistance, planning for infliximab , can stop steroids after infliximab  given (may warrant taper, will review duration of her recent steroid course)  Anemia - related to blood loss - iron  def.  Elevated b12, normal folate.  Ferritin normal.  Hypothyroidism -if patient will be npo x >72 hours will switch to IV synthroid    Pyuria - UA contaminated, no urine culture collected - would follow repeat UA sometime later in course to ensure clearance of microscopic hematuria (outpatient is fine)   HLD  -hold statin while npo    DVT prophylaxis: lovenox  Code Status: full Family Communication: none Disposition:   Per general surgery, primary team   Consultants:  [facility] is consulting Surgery is primary  Procedures:   s/p ex lap with graham patch repair of perforated peptic ulcer 7/12  Antimicrobials:  Anti-infectives (From admission, onward)    Start     Dose/Rate Route Frequency Ordered Stop   07/02/24 1000  piperacillin -tazobactam (ZOSYN ) IVPB 3.375 g        3.375  g 12.5 mL/hr over 240 Minutes Intravenous Every 8 hours 07/02/24 0440 07/07/24 0559   07/02/24 0500  micafungin  (MYCAMINE ) 100 mg in sodium chloride  0.9 % 100 mL IVPB        100 mg 105 mL/hr over 1 Hours Intravenous Daily 07/02/24 0315 07/07/24 0959   07/02/24 0145  piperacillin -tazobactam (ZOSYN ) IVPB 3.375 g        3.375 g 100 mL/hr over 30 Minutes Intravenous  Once 07/02/24 0134 07/02/24 0354   07/01/24 2237  ceFAZolin  (ANCEF ) 2-4 GM/100ML-% IVPB       Note to Pharmacy: Roddie Grate: cabinet override      07/01/24 2237 07/02/24 1044       Subjective: No complaints, daughter at bedside  Objective: Vitals:   07/04/24 0237 07/04/24 0800 07/04/24 1200 07/04/24 1608  BP: (!) 112/92 105/73 113/69 107/78  Pulse:  90 97 100  Resp:  20 19 18   Temp: (!) 97.4 F (36.3 C) 98.1 F (36.7 C) (!) 97.3 F (36.3 C) (!) 97.4 F (36.3 C)  TempSrc: Oral Oral Oral Oral  SpO2:  95% 94% 95%  Weight:      Height:        Intake/Output Summary (Last 24 hours) at 07/04/2024 1610 Last data filed at 07/04/2024 0800 Gross per 24 hour  Intake 303.66 ml  Output 445 ml  Net -141.34 ml   Filed Weights   07/01/24 1751  Weight: 59 kg    Examination:  General: No acute distress. Cardiovascular: RRR Lungs: unlabored  Appropriately ttp Neurological: Alert and oriented 3. Moves all extremities 4 with equal strength. Cranial nerves II through XII grossly intact. Extremities: No clubbing or cyanosis. No edema.   Data Reviewed: I have personally reviewed following labs and imaging studies  CBC: Recent Labs  Lab 07/01/24 1821 07/02/24 0629 07/03/24 0658 07/04/24 0556  WBC 11.9* 10.0 13.6* 9.5  NEUTROABS 9.4*  --   --   --   HGB 8.4* 8.3* 9.8* 10.3*  HCT 27.4* 26.6* 31.8* 34.0*  MCV 89.8 88.4 89.6 91.6  PLT 281 164 206 206    Basic Metabolic Panel: Recent Labs  Lab 07/01/24 1821 07/02/24 0629 07/03/24 0658 07/04/24 0556  NA 126* 133* 133* 134*  K 4.0 3.1* 4.0 4.0  CL 94*  101 101 102  CO2 26 23 23  20*  GLUCOSE 200* 139* 73 55*  BUN 23 16 17 18   CREATININE 0.51 0.37* 0.52 0.58  CALCIUM  8.3* 7.7* 8.7* 8.4*  MG  --   --  2.0 2.0  PHOS  --   --  3.0 3.4    GFR: Estimated Creatinine Clearance: 49.5 mL/min (by C-G formula based on SCr of 0.58 mg/dL).  Liver Function Tests: Recent Labs  Lab 07/01/24 1821 07/02/24 0629  AST 18 13*  ALT 22 16  ALKPHOS 66 43  BILITOT 0.4 0.8  PROT 5.4* 4.3*  ALBUMIN  1.9* 1.9*    CBG: No results for input(s): GLUCAP in the last 168 hours.   Recent Results (from the past 240 hours)  Culture, blood (Routine x 2)     Status: None (Preliminary result)   Collection Time: 07/01/24  5:50 PM   Specimen: BLOOD LEFT FOREARM  Result Value Ref Range Status   Specimen Description BLOOD LEFT FOREARM  Final   Special Requests   Final    BOTTLES DRAWN AEROBIC AND ANAEROBIC Blood Culture adequate volume   Culture   Final    NO GROWTH 3 DAYS Performed at [facility], [address], [place], [place] [address]    Report Status PENDING  Incomplete  Culture, blood (Routine x 2)     Status: None (Preliminary result)   Collection Time: 07/01/24  5:55 PM   Specimen: BLOOD  Result Value Ref Range Status   Specimen Description BLOOD RIGHT ANTECUBITAL  Final   Special Requests   Final    BOTTLES DRAWN AEROBIC AND ANAEROBIC Blood Culture adequate volume   Culture   Final    NO GROWTH 3 DAYS Performed at [facility], [address] [place], [place] [address]    Report Status PENDING  Incomplete         Radiology Studies: No results found.       Scheduled Meds:  sodium chloride    Intravenous Once   enoxaparin  (LOVENOX ) injection  40 mg Subcutaneous Daily   methylPREDNISolone  (SOLU-MEDROL ) injection  40 mg Intravenous Daily   pantoprazole  (PROTONIX ) IV  40 mg Intravenous Q12H   Continuous Infusions:  acetaminophen  1,000 mg (07/04/24 1227)   micafungin  (MYCAMINE ) 100 mg in sodium chloride  0.9 % 100  mL IVPB 100 mg (07/04/24 1305)   piperacillin -tazobactam (ZOSYN )  IV 3.375 g (07/04/24 1435)     LOS: 2 days    Time spent: over 30 min    Meliton Monte, MD [facility]   To contact the attending provider between 7A-7P or the covering provider during after hours 7P-7A, please log into the web site [email or web address] and access using universal [facility] password for that web site. If you do not have the password, please call the hospital operator.  07/04/2024, 4:10 PM

## 2024-07-04 NOTE — Progress Notes (Signed)
 Physical Therapy Treatment Patient Details Name: Allison Finley MRN: [id number] DOB: 08/21/1951 Today's Date: 07/04/2024   History of Present Illness Pt is a 73 y.o. female who presented 07/01/24 with severe abdominal pain. Pt found to have perforated pyloric channel peptic ulcer, s/p ex lap with repair 7/12. PMH: ulcerative colitis    PT Comments  Patient reporting feeling weaker today due to no nutrition for several days. Required incr assist for sit to stand (unable to stand from bed at lowest height with +1 assist; once elevated +1 mod assist; from toilet and BSC +2 mod assist). Was able to ambulate slightly farther distance (110) with RW and CGA. Majority of time pt was in bathroom and was unbillable.    If plan is discharge home, recommend the following: A lot of help with walking and/or transfers;A lot of help with bathing/dressing/bathroom;Assistance with cooking/housework;Assist for transportation;Help with stairs or ramp for entrance   Can travel by private vehicle        Equipment Recommendations  None recommended by PT    Recommendations for Other Services       Precautions / Restrictions Precautions Precautions: Fall Precaution/Restrictions Comments: NGT; abdominal precautions; R JP drain Restrictions Weight Bearing Restrictions Per Provider Order: No     Mobility  Bed Mobility Overal bed mobility: Needs Assistance Bed Mobility: Sidelying to Sit, Rolling Rolling: Supervision Sidelying to sit: Min assist       General bed mobility comments: Cues to roll to R then push up with arms to reduce abdominal pain    Transfers Overall transfer level: Needs assistance Equipment used: Rolling walker (2 wheels) Transfers: Sit to/from Stand, Bed to chair/wheelchair/BSC Sit to Stand: Mod assist, +2 physical assistance, From elevated surface           General transfer comment: Cues for hand placement, modA to power up to stand from elevated bed and from [facility]     Ambulation/Gait Ambulation/Gait assistance: Contact guard assist Gait Distance (Feet): 110 Feet Assistive device: Rolling walker (2 wheels) Gait Pattern/deviations: Step-through pattern, Decreased step length - left, Decreased step length - right, Decreased stride length, Narrow base of support, Trunk flexed Gait velocity: reduced     General Gait Details: Pt ambulates with narrow BOS; needing CGA with VCs provided to widen BOS.   Stairs             Wheelchair Mobility     Tilt Bed    Modified Rankin (Stroke Patients Only)       Balance Overall balance assessment: Needs assistance Sitting-balance support: No upper extremity supported, Feet supported Sitting balance-Leahy Scale: Good     Standing balance support: Reliant on assistive device for balance Standing balance-Leahy Scale: Poor Standing balance comment: reliant on RW                            Communication Communication Communication: No apparent difficulties  Cognition Arousal: Alert Behavior During Therapy: WFL for tasks assessed/performed   PT - Cognitive impairments: No apparent impairments                         Following commands: Intact      Cueing Cueing Techniques: Verbal cues  Exercises      General Comments General comments (skin integrity, edema, etc.): Time in bathroom deducted from charged time      Pertinent Vitals/Pain Pain Assessment Pain Assessment: Faces Faces Pain Scale: Hurts little more  Pain Location: L abdomen Pain Descriptors / Indicators: Discomfort, Grimacing, Guarding, Operative site guarding Pain Intervention(s): Limited activity within patient's tolerance, Monitored during session    Home Living                          Prior Function            PT Goals (current goals can now be found in the care plan section) Acute Rehab PT Goals Patient Stated Goal: to return to being independent PT Goal Formulation: With  patient Time For Goal Achievement: 07/16/24 Potential to Achieve Goals: Good Progress towards PT goals: Progressing toward goals    Frequency    Min 2X/week      PT Plan      Co-evaluation              AM-PAC PT 6 Clicks Mobility   Outcome Measure  Help needed turning from your back to your side while in a flat bed without using bedrails?: A Little Help needed moving from lying on your back to sitting on the side of a flat bed without using bedrails?: A Little Help needed moving to and from a bed to a chair (including a wheelchair)?: A Lot Help needed standing up from a chair using your arms (e.g., wheelchair or bedside chair)?: Total Help needed to walk in hospital room?: A Little Help needed climbing 3-5 steps with a railing? : Total 6 Click Score: 13    End of Session Equipment Utilized During Treatment: Gait belt Activity Tolerance: Patient tolerated treatment well Patient left: in chair;with call bell/phone within reach;with nursing/sitter in room Nurse Communication: Mobility status;Other (comment) (check NG tube with ? not draining) PT Visit Diagnosis: Unsteadiness on feet (R26.81);Other abnormalities of gait and mobility (R26.89);Muscle weakness (generalized) (M62.81);Difficulty in walking, not elsewhere classified (R26.2);Pain Pain - Right/Left:  (abdomen) Pain - part of body:  (abdomen)     Time: 9064-8966 PT Time Calculation (min) (ACUTE ONLY): 58 min  Charges:    $Gait Training: 23-37 mins PT General Charges $$ ACUTE PT VISIT: 1 Visit                      Macario RAMAN, PT Acute Rehabilitation Services  Office [phone]    [name] 07/04/2024, 10:37 AM

## 2024-07-04 NOTE — Progress Notes (Signed)
 While patient attempting to have bowel movement, moderate amount of blood noted in bed pan also in NG tube that is attached low intermittent suction, Abdominal dressing remain dry and intact

## 2024-07-04 NOTE — Progress Notes (Signed)
 Pt requested to ambulate to bathroom. RN and NT standing with pt and encouraged her to stand up straight. Pt stood up successfully for a few seconds then slowly fell to knees. Pt did not hit head or sustain any injuries. Immediately assisted pt back to bed and assessed vital signs. Notified Perri, MD as well as general surgery team. No new orders at this time. Will continue to monitor pt closely.   07/04/24 1738  What Happened  Was fall witnessed? Yes  Who witnessed fall? Woodfin Slough, RN & Darrold, NT  Patients activity before fall bathroom-assisted;to/from bed, chair, or stretcher  Point of contact other (comment) (knees)  Was patient injured? No  Provider Notification  Provider Name/Title A. Meliton Perri, MD  Date Provider Notified 07/04/24  Time Provider Notified 1746  Method of Notification Page  Notification Reason Fall  Provider response No new orders  Date of Provider Response 07/04/24  Time of Provider Response 1755  Follow Up  Family notified Yes - comment  Time family notified 1800  Additional tests No  Progress note created (see row info) Yes  Adult Fall Risk Assessment  Age 1  Fall History: Fall within 6 months prior to admission 0  Elimination; Bowel and/or Urine Incontinence 0  Elimination; Bowel and/or Urine Urgency/Frequency 0  Medications: includes PCA/Opiates, Anti-convulsants, Anti-hypertensives, Diuretics, Hypnotics, Laxatives, Sedatives, and Psychotropics 3  Patient Care Equipment 1  Mobility-Assistance 2  Mobility-Gait 0  Mobility-Sensory Deficit 0  Altered awareness of immediate physical environment 0  Impulsiveness 0  Lack of understanding of one's physical/cognitive limitations 0  Total Score 8  Adult Fall Risk Interventions  Additional Interventions Use of appropriate toileting equipment (bedpan, BSC, etc.);Reorient/diversional activities with confused patients  Screening for Fall Injury Risk (To be completed on HIGH fall risk patients) - Assessing  Need for Floor Mats  Risk For Fall Injury- Criteria for Floor Mats None identified - No additional interventions needed  Vitals  Temp 97.6 F (36.4 C)  Temp Source Oral  BP (!) 143/87  MAP (mmHg) 103  Pulse Rate 100  ECG Heart Rate (!) 102  Resp (!) 23  Oxygen Therapy  SpO2 95 %  O2 Device Room Air  Pain Assessment  Pain Scale 0-10  Pain Score 0  Neurological  Neuro (WDL) WDL  Level of Consciousness Alert  Orientation Level Oriented X4  Cognition Follows commands  Speech Clear  Motor Function/Sensation Assessment Grip;Motor strength  R Hand Grip Moderate  L Hand Grip Moderate  RUE Motor Response Purposeful movement;Responds to commands  RUE Motor Strength 4  LUE Motor Response Purposeful movement;Responds to commands  LUE Motor Strength 4  RLE Motor Response Purposeful movement;Responds to commands  RLE Motor Strength 4  LLE Motor Response Purposeful movement;Responds to commands  LLE Motor Strength 4  Musculoskeletal  Musculoskeletal (WDL) X  Assistive Device Front wheel walker  Generalized Weakness Yes  Weight Bearing Restrictions Per Provider Order No  Integumentary  Integumentary (WDL) X  Skin Color Appropriate for ethnicity  Skin Condition Dry  Skin Integrity Other (Comment)  Skin Turgor Non-tenting

## 2024-07-04 NOTE — Progress Notes (Signed)
 Md notified via secure chat, state will look at labs in the am.

## 2024-07-04 NOTE — Consult Note (Addendum)
 Physical Medicine and Rehabilitation Consult Reason for Consult: Debility after gastric perforation Referring Physician: Dr. Perri   HPI: Suprina Mandeville is a 73 y.o. female with a history of ulcerative colitis who developed a perforated peptic ulcer which ultimately required exploratory lap and surgical repair on 07/02/2024.  Patient has continued on steroids for her ulcerative colitis.  She remains n.p.o. with NG tube.  She is on Zosyn  and micafungin  per general surgery.  H. pylori workup is pending.  Patient's Foley catheter was removed yesterday and she appears to be emptying.  Patient was up with physical therapy today and was mod assist for sit to stand transfers and walked 10 feet contact-guard assistance with narrow base of support.  Patient reported feeling weaker today.  She was up with Occupational Therapy yesterday was mod assist to min assist with basic ADLs.  Patient lives at home alone in a 1 level house with 4 steps to enter.  She may have some family who can intermittently assist.  Patient was independent in driving prior to this hospitalization.    Home: Home Living Family/patient expects to be discharged to:: Private residence Living Arrangements: Alone Available Help at Discharge: Family, Available PRN/intermittently Type of Home: House Home Access: Stairs to enter Secretary/administrator of Steps: 4 Entrance Stairs-Rails: Right, Left Home Layout: One level Bathroom Shower/Tub: Engineer, manufacturing systems: Standard Home Equipment: Rollator (4 wheels), Cane - single point, Toilet riser, Tub bench, Grab bars - tub/shower  Functional History: Prior Function Prior Level of Function : Independent/Modified Independent, Driving, Working/employed Mobility Comments: Was IND without DME until last week or so when pain increased and pt began to use SPC vs rollator as needed ADLs Comments: works part time at [employer] Functional Status:  Mobility: Bed  Mobility Overal bed mobility: Needs Assistance Bed Mobility: Sidelying to Sit, Rolling Rolling: Supervision Sidelying to sit: Min assist General bed mobility comments: Cues to roll to R then push up with arms to reduce abdominal pain Transfers Overall transfer level: Needs assistance Equipment used: Rolling walker (2 wheels) Transfers: Sit to/from Stand, Bed to chair/wheelchair/BSC Sit to Stand: Mod assist, +2 physical assistance, From elevated surface Bed to/from chair/wheelchair/BSC transfer type:: Step pivot Step pivot transfers: Contact guard assist General transfer comment: Cues for hand placement, modA to power up to stand from elevated bed and from [facility] Ambulation/Gait Ambulation/Gait assistance: Contact guard assist Gait Distance (Feet): 110 Feet Assistive device: Rolling walker (2 wheels) Gait Pattern/deviations: Step-through pattern, Decreased step length - left, Decreased step length - right, Decreased stride length, Narrow base of support, Trunk flexed General Gait Details: Pt ambulates with narrow BOS; needing CGA with VCs provided to widen BOS. Gait velocity: reduced Gait velocity interpretation: <1.31 ft/sec, indicative of household ambulator    ADL: ADL Overall ADL's : Needs assistance/impaired Eating/Feeding: NPO Grooming: Wash/dry hands, Contact guard assist, Standing Upper Body Bathing: Set up, Sitting Lower Body Bathing: Moderate assistance, Sit to/from stand Upper Body Dressing : Supervision/safety, Sitting Lower Body Dressing: Moderate assistance, Sit to/from stand Toilet Transfer: Minimal assistance, Ambulation, Regular Toilet, Rolling walker (2 wheels) Toileting- Clothing Manipulation and Hygiene: Contact guard assist, Sitting/lateral lean  Cognition: Cognition Orientation Level: Oriented X4 Cognition Arousal: Alert Behavior During Therapy: WFL for tasks assessed/performed   Review of Systems  Constitutional:  Positive for malaise/fatigue.  HENT:  Negative.    Eyes: Negative.   Respiratory: Negative.    Cardiovascular: Negative.   Gastrointestinal:  Positive for nausea.  Genitourinary: Negative.  Musculoskeletal: Negative.   Skin: Negative.   Neurological:  Positive for weakness.  Psychiatric/Behavioral:  Negative for substance abuse.    Past Medical History:  Diagnosis Date   Inflammatory bowel disease (ulcerative colitis) (HCC) 05/20/2024   Past Surgical History:  Procedure Laterality Date   BONE BIOPSY  05/21/2024   Procedure: BIOPSY, GI;  Surgeon: Legrand Victory LITTIE DOUGLAS, MD;  Location: [facility];  Service: Gastroenterology;;   ENID SIGMOIDOSCOPY N/A 05/21/2024   Procedure: KINGSTON ENID;  Surgeon: Legrand Victory LITTIE DOUGLAS, MD;  Location: [facility];  Service: Gastroenterology;  Laterality: N/A;   LAPAROTOMY N/A 07/01/2024   Procedure: Exploratory laparotomy with [name] repair of perforated peptic ulcer;  Surgeon: Teresa Lonni HERO, MD;  Location: [facility];  Service: General;  Laterality: N/A;   History reviewed. No pertinent family history. Social History:  reports that she has never smoked. She has never used smokeless tobacco. She reports that she does not drink alcohol and does not use drugs. Allergies:  Allergies  Allergen Reactions   Nsaids Other (See Comments)    Perforated peptic ulcer   Beef-Derived Drug Products Other (See Comments)    Vegetarian    Chicken Allergy Other (See Comments)    Vegetarian    Pork-Derived Products Other (See Comments)    vegetarian   Medications Prior to Admission  Medication Sig Dispense Refill   clobetasol cream (TEMOVATE) 0.05 % Apply 1 Application topically daily as needed (psoriasis).     Cyanocobalamin (VITAMIN B-12 PO) Take 1 capsule by mouth daily.     fluticasone (FLONASE) 50 MCG/ACT nasal spray Place 2 sprays into both nostrils daily as needed for allergies.     levothyroxine  (SYNTHROID ) 112 MCG tablet Take 112 mcg by mouth daily.     omeprazole   (PRILOSEC) 40 MG capsule Take 40 mg by mouth daily as needed (reflux).     predniSONE  (DELTASONE ) 10 MG tablet Take 4 tablets (40 mg total) by mouth daily for 10 days, THEN 3 tablets (30 mg total) daily for 10 days, THEN 2 tablets (20 mg total) daily for 10 days, THEN 1 tablet (10 mg total) daily for 10 days. (Patient taking differently: Take 20 mg by mouth in the morning and 20 mg by mouth at night ) 100 tablet 0   rosuvastatin  (CRESTOR ) 5 MG tablet Take 5 mg by mouth at bedtime.     Zinc  Oxide-Vitamin C  (ZINC  PLUS VITAMIN C  PO) Take 1 tablet by mouth daily.     mesalamine (APRISO) 0.375 g 24 hr capsule Take 1.5 g by mouth every morning. (Patient not taking: Reported on 07/02/2024)       Blood pressure 113/69, pulse 97, temperature (!) 97.3 F (36.3 C), temperature source Oral, resp. rate 19, height 5' 2 (1.575 m), weight 59 kg, SpO2 94%. Physical Exam Constitutional:      General: She is not in acute distress.    Appearance: She is ill-appearing.  HENT:     Head: Normocephalic and atraumatic.     Nose: Nose normal.     Comments: NGT in place    Mouth/Throat:     Mouth: Mucous membranes are moist.  Eyes:     Extraocular Movements: Extraocular movements intact.  Cardiovascular:     Rate and Rhythm: Normal rate.     Heart sounds: No murmur heard. Pulmonary:     Effort: Pulmonary effort is normal.  Abdominal:     Tenderness: There is abdominal tenderness.     Comments: Abdomen dressed  Musculoskeletal:        General: Swelling present.     Cervical back: Normal range of motion.  Skin:    General: Skin is warm.  Neurological:     Mental Status: She is alert.     Comments: Alert and oriented x 3. Normal insight and awareness. Intact Memory. Normal language and speech. Cranial nerve exam unremarkable. MMT: BUE 4/5 prox to distal. RLE 3/5-HF,KE, 4/5 ADF/PF. LLE 2+/5 HF, 3-KE and 4/5 ADF/PF. Sensory exam normal for light touch and pain in all 4 limbs. No limb ataxia or cerebellar signs.  No abnormal tone appreciated.   SABRA    Psychiatric:        Mood and Affect: Mood normal.        Behavior: Behavior normal.     Results for orders placed or performed during the hospital encounter of 07/01/24 (from the past 24 hours)  Basic metabolic panel with GFR     Status: Abnormal   Collection Time: 07/04/24  5:56 AM  Result Value Ref Range   Sodium 134 (L) 135 - 145 mmol/L   Potassium 4.0 3.5 - 5.1 mmol/L   Chloride 102 98 - 111 mmol/L   CO2 20 (L) 22 - 32 mmol/L   Glucose, Bld 55 (L) 70 - 99 mg/dL   BUN 18 8 - 23 mg/dL   Creatinine, Ser 9.41 0.44 - 1.00 mg/dL   Calcium  8.4 (L) 8.9 - 10.3 mg/dL   GFR, Estimated >39 >39 mL/min   Anion gap 12 5 - 15  CBC     Status: Abnormal   Collection Time: 07/04/24  5:56 AM  Result Value Ref Range   WBC 9.5 4.0 - 10.5 K/uL   RBC 3.71 (L) 3.87 - 5.11 MIL/uL   Hemoglobin 10.3 (L) 12.0 - 15.0 g/dL   HCT 65.9 (L) 63.9 - 53.9 %   MCV 91.6 80.0 - 100.0 fL   MCH 27.8 26.0 - 34.0 pg   MCHC 30.3 30.0 - 36.0 g/dL   RDW 81.7 (H) 88.4 - 84.4 %   Platelets 206 150 - 400 K/uL   nRBC 0.0 0.0 - 0.2 %  Magnesium     Status: None   Collection Time: 07/04/24  5:56 AM  Result Value Ref Range   Magnesium 2.0 1.7 - 2.4 mg/dL  Phosphorus     Status: None   Collection Time: 07/04/24  5:56 AM  Result Value Ref Range   Phosphorus 3.4 2.5 - 4.6 mg/dL   No results found.  Assessment/Plan: Diagnosis: 73 year old female with debility after perforated peptic ulcer requiring exploratory laparotomy with Arlyss repair patch on 07/02/2024 Does the need for close, 24 hr/day medical supervision in concert with the patient's rehab needs make it unreasonable for this patient to be served in a less intensive setting? Yes Co-Morbidities requiring supervision/potential complications:  - Nutrition/n.p.o. -Ulcerative colitis -wound care -Ongoing infectious disease considerations -Pain control Due to bladder management, bowel management, safety, skin/wound care, disease  management, medication administration, pain management, and patient education, does the patient require 24 hr/day rehab nursing? Yes Does the patient require coordinated care of a physician, rehab nurse, therapy disciplines of PT, OT to address physical and functional deficits in the context of the above medical diagnosis(es)? Yes Addressing deficits in the following areas: balance, endurance, locomotion, strength, transferring, bowel/bladder control, bathing, dressing, feeding, grooming, toileting, and psychosocial support Can the patient actively participate in an intensive therapy program of at least 3 hrs of therapy per day  at least 5 days per week? Yes The potential for patient to make measurable gains while on inpatient rehab is excellent Anticipated functional outcomes upon discharge from inpatient rehab are modified independent  with PT, modified independent and supervision with OT, n/a with SLP. Estimated rehab length of stay to reach the above functional goals is: 7-10 days Anticipated discharge destination: Home Overall Rehab/Functional Prognosis: excellent  POST ACUTE RECOMMENDATIONS: This patient's condition is appropriate for continued rehabilitative care in the following setting: CIR Patient has agreed to participate in recommended program. Yes Note that insurance prior authorization may be required for reimbursement for recommended care.  Comment: Pt lives alone but does have some family who can check in on her and provide some assistance once she gets home. Will follow along for medical readiness for inpatient rehab as she doesn't appear to be quite there yet.  I have personally performed a face to face diagnostic evaluation of this patient. Additionally, I have examined the patient's medical record including any pertinent labs and radiographic images.    Thanks,  Arthea ONEIDA Gunther, MD 07/04/2024

## 2024-07-04 NOTE — Progress Notes (Signed)
 3 Days Post-Op  Subjective: Some incisional pain but overall well controlled. Having dark stools. Some pain from pressure wounds as well.    Afebrile. No tachycardia or hypotension. WBC normalized   Objective: Vital signs in last 24 hours: Temp:  [97.4 F (36.3 C)-98.1 F (36.7 C)] 98.1 F (36.7 C) (07/14 0800) Pulse Rate:  [83-90] 90 (07/14 0800) Resp:  [15-20] 20 (07/14 0800) BP: (105-126)/(66-92) 105/73 (07/14 0800) SpO2:  [94 %-96 %] 95 % (07/14 0800) Last BM Date : 07/03/24  Intake/Output from previous day: 07/13 0701 - 07/14 0700 In: 728.3 [I.V.:171.9; NG/GT:200; IV Piggyback:356.4] Out: 445 [Emesis/NG output:350; Drains:95] Intake/Output this shift: No intake/output data recorded.  PE: Gen:  Alert, NAD, pleasant Card:  Reg Pulm:  Rate and effort normal Abd: Soft, mild distension, appropriately tender around incision, no rigidity or guarding and otherwise NT. Incision with honeycomb dressing in place, cdi. JP drain SS - 350cc/24 hours. NGT with minimal thin output Ext:  No LE edema  Psych: A&Ox3   Lab Results:  Recent Labs    07/03/24 0658 07/04/24 0556  WBC 13.6* 9.5  HGB 9.8* 10.3*  HCT 31.8* 34.0*  PLT 206 206   BMET Recent Labs    07/03/24 0658 07/04/24 0556  NA 133* 134*  K 4.0 4.0  CL 101 102  CO2 23 20*  GLUCOSE 73 55*  BUN 17 18  CREATININE 0.52 0.58  CALCIUM  8.7* 8.4*   PT/INR Recent Labs    07/01/24 1821  LABPROT 16.1*  INR 1.2   CMP     Component Value Date/Time   NA 134 (L) 07/04/2024 0556   K 4.0 07/04/2024 0556   CL 102 07/04/2024 0556   CO2 20 (L) 07/04/2024 0556   GLUCOSE 55 (L) 07/04/2024 0556   BUN 18 07/04/2024 0556   CREATININE 0.58 07/04/2024 0556   CALCIUM  8.4 (L) 07/04/2024 0556   PROT 4.3 (L) 07/02/2024 0629   ALBUMIN  1.9 (L) 07/02/2024 0629   AST 13 (L) 07/02/2024 0629   ALT 16 07/02/2024 0629   ALKPHOS 43 07/02/2024 0629   BILITOT 0.8 07/02/2024 0629   GFRNONAA >60 07/04/2024 0556   Lipase      Component Value Date/Time   LIPASE <10 (L) 08/02/2021 2201    Studies/Results: No results found.   Anti-infectives: Anti-infectives (From admission, onward)    Start     Dose/Rate Route Frequency Ordered Stop   07/02/24 1000  piperacillin -tazobactam (ZOSYN ) IVPB 3.375 g        3.375 g 12.5 mL/hr over 240 Minutes Intravenous Every 8 hours 07/02/24 0440 07/07/24 0559   07/02/24 0500  micafungin  (MYCAMINE ) 100 mg in sodium chloride  0.9 % 100 mL IVPB        100 mg 105 mL/hr over 1 Hours Intravenous Daily 07/02/24 0315 07/07/24 0959   07/02/24 0145  piperacillin -tazobactam (ZOSYN ) IVPB 3.375 g        3.375 g 100 mL/hr over 30 Minutes Intravenous  Once 07/02/24 0134 07/02/24 0354   07/01/24 2237  ceFAZolin  (ANCEF ) 2-4 GM/100ML-% IVPB       Note to Pharmacy: Roddie Grate: cabinet override      07/01/24 2237 07/02/24 1044        Assessment/Plan POD2 s/p Exploratory laparotomy with [name] repair of perforated peptic ulcer by Dr. Teresa on 07/02/24 for Perforated pyloric channel peptic ulcer  - Cont NGT to LIWS, nothing per tube, strict NPO, plan UGI POD #3 - Cont JP. SS -  Cont IV abx and antifungals, plan 5d post op - Cont BID PPI - Check H. Pylori - discussed with bedside RN - PRN IV pain meds. Avoid NSAIDs - Mobilize, PT - Pulm toilet  FEN - NPO, NGT to LIWS, IVF.  VTE - SCDs, Lovenox   ID - Zosyn , Micafungin   Foley - removed 7/13, voiding  Hx of UC on 40 mg prednisone  per day - on solucortef currently. TRH consult to assist with stress dose steroid dosing. Hypothyroidism - TRH recommends if patient will be npo x >72 hours to switch to IV synthroid   Hematuria - TRH recommends repeating UA sometime later in course to ensure clearance of microscopic hematuria     LOS: 2 days    Burnard JONELLE Louder, [facility] 07/04/2024, 9:52 AM Please see Amion for pager number during day hours 7:00am-4:30pm

## 2024-07-04 NOTE — Consult Note (Addendum)
 Reason for Consult: Perforated gastric ulcer/ulcerative colitis. Referring Physician: [facility].  Allison Finley is an 73 y.o. female.  HPI: Allison Finley is a 73 year old female with multiple medical problems listed below, who presented to the emergency room on 07/01/2024 with acute abdominal pain.  Workup in the ER revealed a lactate level of 2.6 along with a hemoglobin of 8.4 g/dL; CT scan of the abdomen pelvis on admission revealed a large anterior pyloric channel ulcer and small amount of intraperitoneal gas along the falciform ligament and porta hepatis suspicious for early perforation of the ulcer ascites was noted as well along with abnormal thickening of the colon wall excluding the cecum compatible with nonspecific colitis trace diffuse edema of the omentum was noted as well along with pancreatic atrophy involving the entire pancreas except for the pancreatic head and a substantial cystocele of this with pelvic floor laxity and likely vaginal prolapse with low position of the anorectal junction.  Lumbar spondylosis with degenerative disc disease was described along with foraminal impingement at L4-L5 and L5-S1. Aortic atherosclerosis also noted. On a recent admission in June this year patient was diagnosed with ulcerative colitis on flexible Finley and was started on Mesalamine 4 gms/day but has symptoms did not improve she was advised to start Prednisone  40 mg twice daily which she continued taking until the time of evaluation.  She is on Solu-Medrol  40 mg IV per day since admission. She denies any nonsteroidal use. She was supposed to be started on Biologics but as her TB QuantiFERON was indeterminate TB skin test was done and was read as negative by her PCP last week on 06/29/2024 [[facility]]. On 07/02/24 patient underwent exploratory laparotomy with Graham's patch repair of a perforated peptic ulcer. She seems to be doing fairly well postoperatively and  denies any major GI complaints at this time. Her stool is somewhat dark and she is FOBT positive. She has had 1 small volume BM this morning. She feels weak and fatigued but denies having any major problems abdominal pain except for tenderness at the incision site. Stool antigen for H. pylori has been ordered. At her baseline after her diagnosis of ulcerative colitis she was having to loose BMs per day with fecal urgency but denied having any melena or hematochezia when she started the Prednisone .  She gives a history of having severe psoriasis after she received the shingles vaccine. She has history of arthritis but denies any ocular complaints at this time. She has a history of iron  deficiency anemia and received 1 unit of packed red blood cells intraoperatively this weekend. Her hemoglobin today is 10.3 g/dL Her last colonoscopy was done in 2022 when she was diagnosed with scattered sigmoid diverticulosis..  Past Medical History:  Diagnosis Date   Inflammatory bowel disease (ulcerative colitis) (HCC) 05/20/2024   Iron  deficiency anemia    Colonic diverticulosis    Hypothyroidism    Psoriasis    Hyperlipidemia    Shingles    Past Surgical History:  Procedure Laterality Date   BONE BIOPSY  05/21/2024   Procedure: BIOPSY, GI;  Surgeon: Allison Victory LITTIE DOUGLAS, MD;  Location: [facility];  Service: Gastroenterology;;   Allison Finley N/A 05/21/2024   Procedure: Allison Finley;  Surgeon: Allison Victory LITTIE DOUGLAS, MD;  Location: [facility];  Service: Gastroenterology;  Laterality: N/A;   LAPAROTOMY N/A 07/01/2024   Procedure: Exploratory laparotomy with [name] repair of perforated peptic ulcer;  Surgeon: Allison Lonni HERO, MD;  Location: [facility] OR;  Service: General;  Laterality: N/A;   History reviewed. No pertinent family history.  Social History:  reports that she has never smoked. She has never used smokeless tobacco. She reports that she does not drink alcohol and does not use  drugs.  Allergies:  Allergies  Allergen Reactions   Nsaids Other (See Comments)    Perforated peptic ulcer   Beef-Derived Drug Products Other (See Comments)    Vegetarian    Chicken Allergy Other (See Comments)    Vegetarian    Pork-Derived Products Other (See Comments)    vegetarian   Medications: I have reviewed the patient's current medications. Prior to Admission:  Medications Prior to Admission  Medication Sig Dispense Refill Last Dose/Taking   clobetasol cream (TEMOVATE) 0.05 % Apply 1 Application topically daily as needed (psoriasis).   Unknown   Cyanocobalamin (VITAMIN B-12 PO) Take 1 capsule by mouth daily.   07/01/2024   fluticasone (FLONASE) 50 MCG/ACT nasal spray Place 2 sprays into both nostrils daily as needed for allergies.   Unknown   levothyroxine  (SYNTHROID ) 112 MCG tablet Take 112 mcg by mouth daily.   07/01/2024   omeprazole  (PRILOSEC) 40 MG capsule Take 40 mg by mouth daily as needed (reflux).   Unknown   predniSONE  (DELTASONE ) 10 MG tablet Take 4 tablets (40 mg total) by mouth daily for 10 days, THEN 3 tablets (30 mg total) daily for 10 days, THEN 2 tablets (20 mg total) daily for 10 days, THEN 1 tablet (10 mg total) daily for 10 days. (Patient taking differently: Take 20 mg by mouth in the morning and 20 mg by mouth at night ) 100 tablet 0 07/01/2024   rosuvastatin  (CRESTOR ) 5 MG tablet Take 5 mg by mouth at bedtime.   06/30/2024   Zinc  Oxide-Vitamin C  (ZINC  PLUS VITAMIN C  PO) Take 1 tablet by mouth daily.   07/01/2024   mesalamine (APRISO) 0.375 g 24 hr capsule Take 1.5 g by mouth every morning. (Patient not taking: Reported on 07/02/2024)   Not Taking   Scheduled:  sodium chloride    Intravenous Once   enoxaparin  (LOVENOX ) injection  40 mg Subcutaneous Daily   methylPREDNISolone  (SOLU-MEDROL ) injection  40 mg Intravenous Daily   pantoprazole  (PROTONIX ) IV  40 mg Intravenous Q12H   Continuous:  acetaminophen  1,000 mg (07/04/24 1227)   micafungin  (MYCAMINE ) 100 mg  in sodium chloride  0.9 % 100 mL IVPB 100 mg (07/04/24 1305)   piperacillin -tazobactam (ZOSYN )  IV 3.375 g (07/04/24 1435)   PRN:diphenhydrAMINE  **OR** diphenhydrAMINE , diphenhydrAMINE  **OR** diphenhydrAMINE , HYDROmorphone  (DILAUDID ) injection, metoprolol  tartrate, naloxone  **AND** sodium chloride  flush, ondansetron  **OR** ondansetron  (ZOFRAN ) IV  Results for orders placed or performed during the hospital encounter of 07/01/24 (from the past 48 hours)  CBC     Status: Abnormal   Collection Time: 07/03/24  6:58 AM  Result Value Ref Range   WBC 13.6 (H) 4.0 - 10.5 K/uL   RBC 3.55 (L) 3.87 - 5.11 MIL/uL   Hemoglobin 9.8 (L) 12.0 - 15.0 g/dL   HCT 68.1 (L) 63.9 - 53.9 %   MCV 89.6 80.0 - 100.0 fL   MCH 27.6 26.0 - 34.0 pg   MCHC 30.8 30.0 - 36.0 g/dL   RDW 81.8 (H) 88.4 - 84.4 %   Platelets 206 150 - 400 K/uL   nRBC 0.0 0.0 - 0.2 %    Comment: Performed at [facility], [address], [place], [place] [address]  Basic metabolic panel     Status: Abnormal  Collection Time: 07/03/24  6:58 AM  Result Value Ref Range   Sodium 133 (L) 135 - 145 mmol/L   Potassium 4.0 3.5 - 5.1 mmol/L   Chloride 101 98 - 111 mmol/L   CO2 23 22 - 32 mmol/L   Glucose, Bld 73 70 - 99 mg/dL    Comment: Glucose reference range applies only to samples taken after fasting for at least 8 hours.   BUN 17 8 - 23 mg/dL   Creatinine, Ser 9.47 0.44 - 1.00 mg/dL   Calcium  8.7 (L) 8.9 - 10.3 mg/dL   GFR, Estimated >39 >39 mL/min    Comment: (NOTE) Calculated using the CKD-EPI Creatinine Equation (2021)    Anion gap 9 5 - 15    Comment: Performed at [facility], [address] [place], [place] [address]  Magnesium     Status: None   Collection Time: 07/03/24  6:58 AM  Result Value Ref Range   Magnesium 2.0 1.7 - 2.4 mg/dL    Comment: Performed at [facility], [address], [place], [place] [address]  Phosphorus     Status: None   Collection Time: 07/03/24  6:58 AM  Result Value Ref Range    Phosphorus 3.0 2.5 - 4.6 mg/dL    Comment: Performed at [facility], [address], [place], [place] [address]  Basic metabolic panel with GFR     Status: Abnormal   Collection Time: 07/04/24  5:56 AM  Result Value Ref Range   Sodium 134 (L) 135 - 145 mmol/L   Potassium 4.0 3.5 - 5.1 mmol/L   Chloride 102 98 - 111 mmol/L   CO2 20 (L) 22 - 32 mmol/L   Glucose, Bld 55 (L) 70 - 99 mg/dL    Comment: Glucose reference range applies only to samples taken after fasting for at least 8 hours.   BUN 18 8 - 23 mg/dL   Creatinine, Ser 9.41 0.44 - 1.00 mg/dL   Calcium  8.4 (L) 8.9 - 10.3 mg/dL   GFR, Estimated >39 >39 mL/min    Comment: (NOTE) Calculated using the CKD-EPI Creatinine Equation (2021)    Anion gap 12 5 - 15    Comment: Performed at [facility], [address] [place], [place] [address]  CBC     Status: Abnormal   Collection Time: 07/04/24  5:56 AM  Result Value Ref Range   WBC 9.5 4.0 - 10.5 K/uL   RBC 3.71 (L) 3.87 - 5.11 MIL/uL   Hemoglobin 10.3 (L) 12.0 - 15.0 g/dL   HCT 65.9 (L) 63.9 - 53.9 %   MCV 91.6 80.0 - 100.0 fL   MCH 27.8 26.0 - 34.0 pg   MCHC 30.3 30.0 - 36.0 g/dL   RDW 81.7 (H) 88.4 - 84.4 %   Platelets 206 150 - 400 K/uL   nRBC 0.0 0.0 - 0.2 %    Comment: Performed at [facility], [address] [place], [place] [address]  Magnesium     Status: None   Collection Time: 07/04/24  5:56 AM  Result Value Ref Range   Magnesium 2.0 1.7 - 2.4 mg/dL    Comment: Performed at [facility], [address], [place], [place] [address]  Phosphorus     Status: None   Collection Time: 07/04/24  5:56 AM  Result Value Ref Range   Phosphorus 3.4 2.5 - 4.6 mg/dL    Comment: Performed at [facility], [address] [place], [place] [address]    No results found.  Review of Systems  Constitutional:  Positive for activity change, appetite change and fatigue. Negative for chills, diaphoresis, fever and unexpected weight change.  HENT:  Negative.    Eyes: Negative.   Respiratory: Negative.    Cardiovascular: Negative.   Gastrointestinal:  Positive for abdominal distention, abdominal pain and diarrhea. Negative for anal bleeding, blood in stool, constipation, rectal pain and vomiting.  Endocrine: Negative.   Genitourinary: Negative.   Musculoskeletal:  Positive for arthralgias and back pain. Negative for gait problem, joint swelling, myalgias, neck pain and neck stiffness.  Skin: Negative.   Allergic/Immunologic: Negative.   Neurological: Negative.   Hematological: Negative.   Psychiatric/Behavioral: Negative.     Blood pressure 113/69, pulse 97, temperature (!) 97.3 F (36.3 C), temperature source Oral, resp. rate 19, height 5' 2 (1.575 m), weight 59 kg, SpO2 94%. Physical Exam Constitutional:      General: She is not in acute distress.    Appearance: She is ill-appearing. She is not diaphoretic.  HENT:     Head: Normocephalic and atraumatic.  Eyes:     Extraocular Movements: Extraocular movements intact.     Pupils: Pupils are equal, round, and reactive to light.  Cardiovascular:     Rate and Rhythm: Normal rate and regular rhythm.  Pulmonary:     Effort: Pulmonary effort is normal.     Breath sounds: Normal breath sounds.  Abdominal:     General: A surgical scar is present. Bowel sounds are decreased. There is distension.     Palpations: Abdomen is soft.     Tenderness: There is generalized abdominal tenderness.  Skin:    General: Skin is warm and dry.  Neurological:     General: No focal deficit present.     Mental Status: She is alert and oriented to person, place, and time.  Psychiatric:        Mood and Affect: Mood normal.        Behavior: Behavior normal.   Assessment/Plan: 1) Perforated pyloric channel ulcer-status post laparotomy with [name] repair of pyloric channel ulcer-on IV Protonix .  Stool antigen for H. pylori is pending. 2) Ulcerative colitis/abnormal CT scan on IV Solu-Medrol .  Orders for infliximab  have been written. Once she gets the infliximab  the steroids will be stopped. 3) Iron  deficiency anemia. 4) Hypothyroidism. 5) Hyperlipidemia. 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 7) Large cystocele with pelvic floor laxity and possible vaginal prolapse. 8) Lumbar spondylosis with degenerative disc disease causing foraminal impingement L4-L5 and L5-S1. 9) Severe protein calorie malnutrition with albumin  of 1.9. Renaye Sous 07/04/2024, 2:08 PM

## 2024-07-05 ENCOUNTER — Inpatient Hospital Stay (HOSPITAL_COMMUNITY)

## 2024-07-05 DIAGNOSIS — E43 Unspecified severe protein-calorie malnutrition: Secondary | ICD-10-CM | POA: Insufficient documentation

## 2024-07-05 DIAGNOSIS — K3189 Other diseases of stomach and duodenum: Secondary | ICD-10-CM | POA: Diagnosis not present

## 2024-07-05 LAB — BPAM RBC
Blood Product Expiration Date: 202508112359
Blood Product Expiration Date: 202508112359
ISSUE DATE / TIME: 202507112341
ISSUE DATE / TIME: 202507112341
Unit Type and Rh: 6200
Unit Type and Rh: 6200

## 2024-07-05 LAB — TYPE AND SCREEN
ABO/RH(D): A POS
Antibody Screen: NEGATIVE
Unit division: 0
Unit division: 0

## 2024-07-05 MED ORDER — IOHEXOL 300 MG/ML  SOLN
100.0000 mL | Freq: Once | INTRAMUSCULAR | Status: AC | PRN
  Administered 2024-07-05: 90 mL

## 2024-07-05 MED ORDER — TRAMADOL HCL 50 MG PO TABS: 50.0000 mg | ORAL_TABLET | Freq: Four times a day (QID) | ORAL | Status: DC | PRN

## 2024-07-05 MED ORDER — ACETAMINOPHEN 500 MG PO TABS
1000.0000 mg | ORAL_TABLET | Freq: Four times a day (QID) | ORAL | Status: DC
  Administered 2024-07-05 – 2024-07-06 (×6): 1000 mg
  Filled 2024-07-05 (×5): qty 2

## 2024-07-05 MED ORDER — KATE FARMS STANDARD 1.4 PO LIQD
325.0000 mL | Freq: Three times a day (TID) | ORAL | Status: DC
  Administered 2024-07-05 – 2024-07-18 (×31): 325 mL via ORAL
  Filled 2024-07-05 (×41): qty 325

## 2024-07-05 MED ORDER — ZINC SULFATE 220 (50 ZN) MG PO CAPS
220.0000 mg | ORAL_CAPSULE | Freq: Every day | ORAL | Status: DC
  Administered 2024-07-05 – 2024-07-18 (×14): 220 mg via ORAL
  Filled 2024-07-05 (×14): qty 1

## 2024-07-05 MED ORDER — SODIUM CHLORIDE 0.9 % IV SOLN: INTRAVENOUS | Status: DC

## 2024-07-05 MED ORDER — ACETAMINOPHEN 10 MG/ML IV SOLN
1000.0000 mg | Freq: Four times a day (QID) | INTRAVENOUS | Status: DC
Start: 2024-07-05 — End: 2024-07-05

## 2024-07-05 MED ORDER — VITAMIN C 500 MG PO TABS
500.0000 mg | ORAL_TABLET | Freq: Every day | ORAL | Status: DC
  Administered 2024-07-05 – 2024-07-07 (×3): 500 mg via ORAL
  Filled 2024-07-05 (×3): qty 1

## 2024-07-05 MED ORDER — IOHEXOL 300 MG/ML  SOLN
100.0000 mL | Freq: Once | INTRAMUSCULAR | Status: AC | PRN
  Administered 2024-07-05: 100 mL

## 2024-07-05 MED ORDER — ENOXAPARIN SODIUM 40 MG/0.4ML IJ SOSY
40.0000 mg | PREFILLED_SYRINGE | Freq: Every day | INTRAMUSCULAR | Status: DC
  Administered 2024-07-06: 40 mg via SUBCUTANEOUS
  Filled 2024-07-05: qty 0.4

## 2024-07-05 MED ORDER — SODIUM CHLORIDE 0.9 % IV SOLN
300.0000 mg | Freq: Once | INTRAVENOUS | Status: DC
  Filled 2024-07-05: qty 30

## 2024-07-05 MED ORDER — ADULT MULTIVITAMIN W/MINERALS CH
1.0000 | ORAL_TABLET | Freq: Every day | ORAL | Status: DC
  Administered 2024-07-05 – 2024-07-18 (×14): 1 via ORAL
  Filled 2024-07-05 (×14): qty 1

## 2024-07-05 MED ORDER — THIAMINE MONONITRATE 100 MG PO TABS
100.0000 mg | ORAL_TABLET | Freq: Every day | ORAL | Status: DC
  Administered 2024-07-05 – 2024-07-12 (×8): 100 mg via ORAL
  Filled 2024-07-05 (×8): qty 1

## 2024-07-05 NOTE — Consult Note (Signed)
 Consult NOTE    Allison Finley  FMW:[id number] DOB: Aug 28, 1951 DOA: 07/01/2024 PCP: Lynwood Laneta ORN, PA-C  Chief Complaint  Patient presents with   Abdominal Pain    Brief Narrative:   73 yo with hhx ulcerative colitis, hypothyroidism, IDA, dyslipidemia and other medical issues presenting with perforated peptic ulcer now s/p graham patch repair on 07/02/2024.  Hospitalists were consulted for medical management.  Assessment & Plan:   Principal Problem:   Gastric perforation, acute Active Problems:   Protein-calorie malnutrition, severe  Acute gastric perforation s/p exlap/surgical repair  - s/p ex lap with graham patch repair of perforated peptic ulcer 7/12 - post op surgical care per primary team -  upper GI without gastric leak or contrast extravasation.  diet per surgery.  Abx and antifungals per surgery.  BID PPI.  Pending H pylori workup.  See note.   Ulcerative colitis  - hx of recent flare 3 weeks ago with rectal bleeding  - CT with nonspecific colitis on CT - appreciate GI assistance, planning for infliximab  (2 weeks after surgery - likely outpatient) - continue solumedrol for now  Anemia - related to blood loss - iron  def.  Elevated b12, normal folate.  Ferritin normal.  Hypothyroidism -if patient will be npo x >72 hours will switch to IV synthroid    Pyuria - UA contaminated, no urine culture collected - would follow repeat UA sometime later in course to ensure clearance of microscopic hematuria (outpatient is fine)   HLD  -hold statin while npo    DVT prophylaxis: lovenox  Code Status: full Family Communication: none Disposition:   Per general surgery, primary team   Consultants:  [facility] is consulting Surgery is primary  Procedures:   s/p ex lap with graham patch repair of perforated peptic ulcer 7/12  Antimicrobials:  Anti-infectives (From admission, onward)    Start     Dose/Rate Route Frequency Ordered Stop   07/02/24 1000   piperacillin -tazobactam (ZOSYN ) IVPB 3.375 g        3.375 g 12.5 mL/hr over 240 Minutes Intravenous Every 8 hours 07/02/24 0440 07/07/24 0559   07/02/24 0500  micafungin  (MYCAMINE ) 100 mg in sodium chloride  0.9 % 100 mL IVPB        100 mg 105 mL/hr over 1 Hours Intravenous Daily 07/02/24 0315 07/07/24 0959   07/02/24 0145  piperacillin -tazobactam (ZOSYN ) IVPB 3.375 g        3.375 g 100 mL/hr over 30 Minutes Intravenous  Once 07/02/24 0134 07/02/24 0354   07/01/24 2237  ceFAZolin  (ANCEF ) 2-4 GM/100ML-% IVPB       Note to Pharmacy: Roddie Grate: cabinet override      07/01/24 2237 07/02/24 1044       Subjective: No complaints  Objective: Vitals:   07/04/24 2256 07/05/24 0328 07/05/24 0722 07/05/24 1156  BP: 122/77  117/78 117/74  Pulse: 92   92  Resp: 13  20 14   Temp: 97.7 F (36.5 C) (!) 97.5 F (36.4 C) 97.7 F (36.5 C) 97.7 F (36.5 C)  TempSrc: Oral Oral Oral Oral  SpO2: 95%  96% 96%  Weight:      Height:        Intake/Output Summary (Last 24 hours) at 07/05/2024 1626 Last data filed at 07/05/2024 1500 Gross per 24 hour  Intake 230.08 ml  Output 340 ml  Net -109.92 ml   Filed Weights   07/01/24 1751 07/04/24 1748  Weight: 59 kg 60.1 kg    Examination:  General: No acute  distress. Cardiovascular: RRR Lungs:  unlabored Abdomen: Soft, nontender, nondistended  Neurological: Alert and oriented 3. Moves all extremities 4 with equal strength. Cranial nerves II through XII grossly intact. Extremities: No clubbing or cyanosis. No edema.   Data Reviewed: I have personally reviewed following labs and imaging studies  CBC: Recent Labs  Lab 07/01/24 1821 07/02/24 0629 07/03/24 0658 07/04/24 0556  WBC 11.9* 10.0 13.6* 9.5  NEUTROABS 9.4*  --   --   --   HGB 8.4* 8.3* 9.8* 10.3*  HCT 27.4* 26.6* 31.8* 34.0*  MCV 89.8 88.4 89.6 91.6  PLT 281 164 206 206    Basic Metabolic Panel: Recent Labs  Lab 07/01/24 1821 07/02/24 0629 07/03/24 0658  07/04/24 0556  NA 126* 133* 133* 134*  K 4.0 3.1* 4.0 4.0  CL 94* 101 101 102  CO2 26 23 23  20*  GLUCOSE 200* 139* 73 55*  BUN 23 16 17 18   CREATININE 0.51 0.37* 0.52 0.58  CALCIUM  8.3* 7.7* 8.7* 8.4*  MG  --   --  2.0 2.0  PHOS  --   --  3.0 3.4    GFR: Estimated Creatinine Clearance: 49.5 mL/min (by C-G formula based on SCr of 0.58 mg/dL).  Liver Function Tests: Recent Labs  Lab 07/01/24 1821 07/02/24 0629  AST 18 13*  ALT 22 16  ALKPHOS 66 43  BILITOT 0.4 0.8  PROT 5.4* 4.3*  ALBUMIN  1.9* 1.9*    CBG: No results for input(s): GLUCAP in the last 168 hours.   Recent Results (from the past 240 hours)  Culture, blood (Routine x 2)     Status: None (Preliminary result)   Collection Time: 07/01/24  5:50 PM   Specimen: BLOOD LEFT FOREARM  Result Value Ref Range Status   Specimen Description BLOOD LEFT FOREARM  Final   Special Requests   Final    BOTTLES DRAWN AEROBIC AND ANAEROBIC Blood Culture adequate volume   Culture   Final    NO GROWTH 4 DAYS Performed at [facility], [address], [place], [place] [address]    Report Status PENDING  Incomplete  Culture, blood (Routine x 2)     Status: None (Preliminary result)   Collection Time: 07/01/24  5:55 PM   Specimen: BLOOD  Result Value Ref Range Status   Specimen Description BLOOD RIGHT ANTECUBITAL  Final   Special Requests   Final    BOTTLES DRAWN AEROBIC AND ANAEROBIC Blood Culture adequate volume   Culture   Final    NO GROWTH 4 DAYS Performed at [facility], [address] [place], [place] [address]    Report Status PENDING  Incomplete         Radiology Studies: DG UGI W SINGLE CM (SOL OR THIN BA) Result Date: 07/05/2024 CLINICAL DATA:  Patient with perforated pyloric channel peptic ulcer, s/p graham patch repair 07/02/24. evaluate for leak. EXAM: DG UGI W SINGLE CM TECHNIQUE: Scout radiograph was obtained. Patient was given small amount of thin barium by mouth but unable to drink  significant amount. 80 mL of Omnipaque  300 was pushed via NG tube. Exam was significantly limited due to patient mobility, decreased oral intake ability. This exam was performed by Kimble Clas, PA-C, and was supervised and interpreted by Dr. Vanice. FLUOROSCOPY: Radiation Exposure Index (as provided by the fluoroscopic device): 19.60 mGy Kerma COMPARISON:  None Available. FINDINGS: Esophagus: Not evaluated due to limited study Esophageal motility: Not evaluated due to limited study Gastroesophageal  reflux: Not evaluated due to limited study Ingested 13 mm barium tablet: Not given Stomach: Normal appearance. No evidence of leak Gastric emptying: Normal. Duodenum: Normal appearance. Other:  None. IMPRESSION: Limited study. No evidence of any significant gastric leak or contrast extravasation. Normal passage of contrast into the duodenum and distal small intestine. Electronically Signed   By: CHRISTELLA.  Shick M.D.   On: 07/05/2024 09:47         Scheduled Meds:  sodium chloride    Intravenous Once   acetaminophen   1,000 mg Per Tube Q6H   ascorbic acid   500 mg Oral Daily   [START ON 07/06/2024] enoxaparin  (LOVENOX ) injection  40 mg Subcutaneous Daily   feeding supplement ([employer] STANDARD 1.4)  325 mL Oral TID BM   methylPREDNISolone  (SOLU-MEDROL ) injection  40 mg Intravenous Daily   multivitamin with minerals  1 tablet Oral Daily   pantoprazole  (PROTONIX ) IV  40 mg Intravenous Q12H   thiamine   100 mg Oral Daily   zinc  sulfate (50mg  elemental zinc )  220 mg Oral Daily   Continuous Infusions:  sodium chloride  75 mL/hr at 07/05/24 1035   micafungin  (MYCAMINE ) 100 mg in sodium chloride  0.9 % 100 mL IVPB 100 mg (07/05/24 1040)   piperacillin -tazobactam (ZOSYN )  IV 3.375 g (07/05/24 1459)     LOS: 3 days    Time spent: over 30 min    Meliton Monte, MD [facility]   To contact the attending provider between 7A-7P or the covering provider during after hours 7P-7A, please log into the web  site [email or web address] and access using universal [place] password for that web site. If you do not have the password, please call the hospital operator.  07/05/2024, 4:26 PM

## 2024-07-05 NOTE — Progress Notes (Signed)
 4 Days Post-Op  Subjective: Pt had a fall last night where she was standing with RN and NT and felt weak, lowered gently to knees. Denies hitting head and denies knee pain this AM. VSS. Patient reports feeling weak. Noted urine was a little dark overnight. Also had 1 episode BRBPR. Plans for UGI this AM. Denies significant abdominal pain.    Objective: Vital signs in last 24 hours: Temp:  [97.3 F (36.3 C)-97.7 F (36.5 C)] 97.7 F (36.5 C) (07/15 0722) Pulse Rate:  [84-100] 92 (07/14 2256) Resp:  [13-23] 20 (07/15 0722) BP: (105-143)/(69-87) 117/78 (07/15 0722) SpO2:  [94 %-96 %] 96 % (07/15 0722) Weight:  [60.1 kg] 60.1 kg (07/14 1748) Last BM Date : 07/04/24  Intake/Output from previous day: 07/14 0701 - 07/15 0700 In: 262.9 [IV Piggyback:262.9] Out: 540 [Urine:300; Emesis/NG output:200; Drains:40] Intake/Output this shift: No intake/output data recorded.  PE: Gen:  Alert, NAD, pleasant Card:  Reg Pulm:  Rate and effort normal Abd: Soft, mild distension, appropriately tender around incision, no rigidity or guarding and otherwise NT. Incision with honeycomb dressing in place, cdi. JP drain SS - 40cc/24 hours. NGT with minimal thin bilious output Ext:  No LE edema, no significant edema or ecchymosis of bilateral knees, no TTP of bilateral knees Psych: A&Ox3   Lab Results:  Recent Labs    07/03/24 0658 07/04/24 0556  WBC 13.6* 9.5  HGB 9.8* 10.3*  HCT 31.8* 34.0*  PLT 206 206   BMET Recent Labs    07/03/24 0658 07/04/24 0556  NA 133* 134*  K 4.0 4.0  CL 101 102  CO2 23 20*  GLUCOSE 73 55*  BUN 17 18  CREATININE 0.52 0.58  CALCIUM  8.7* 8.4*   PT/INR No results for input(s): LABPROT, INR in the last 72 hours.  CMP     Component Value Date/Time   NA 134 (L) 07/04/2024 0556   K 4.0 07/04/2024 0556   CL 102 07/04/2024 0556   CO2 20 (L) 07/04/2024 0556   GLUCOSE 55 (L) 07/04/2024 0556   BUN 18 07/04/2024 0556   CREATININE 0.58 07/04/2024 0556    CALCIUM  8.4 (L) 07/04/2024 0556   PROT 4.3 (L) 07/02/2024 0629   ALBUMIN  1.9 (L) 07/02/2024 0629   AST 13 (L) 07/02/2024 0629   ALT 16 07/02/2024 0629   ALKPHOS 43 07/02/2024 0629   BILITOT 0.8 07/02/2024 0629   GFRNONAA >60 07/04/2024 0556   Lipase     Component Value Date/Time   LIPASE <10 (L) 08/02/2021 2201    Studies/Results: No results found.   Anti-infectives: Anti-infectives (From admission, onward)    Start     Dose/Rate Route Frequency Ordered Stop   07/02/24 1000  piperacillin -tazobactam (ZOSYN ) IVPB 3.375 g        3.375 g 12.5 mL/hr over 240 Minutes Intravenous Every 8 hours 07/02/24 0440 07/07/24 0559   07/02/24 0500  micafungin  (MYCAMINE ) 100 mg in sodium chloride  0.9 % 100 mL IVPB        100 mg 105 mL/hr over 1 Hours Intravenous Daily 07/02/24 0315 07/07/24 0959   07/02/24 0145  piperacillin -tazobactam (ZOSYN ) IVPB 3.375 g        3.375 g 100 mL/hr over 30 Minutes Intravenous  Once 07/02/24 0134 07/02/24 0354   07/01/24 2237  ceFAZolin  (ANCEF ) 2-4 GM/100ML-% IVPB       Note to Pharmacy: Roddie Grate: cabinet override      07/01/24 2237 07/02/24 1044  Assessment/Plan POD3 s/p Exploratory laparotomy with [name] repair of perforated peptic ulcer by Dr. Teresa on 07/02/24 for Perforated pyloric channel peptic ulcer  - UGI today - if no leak then will DC NGT and start CLD +supplements, if unable to remove NGT or advance today will order PICC/TPN - Cont JP. SS - Cont IV abx and antifungals, plan 5d post op - Cont BID PPI - Check H. Pylori - pending currently - PRN IV pain meds. Avoid NSAIDs - Mobilize, PT - recs for CIR currently  - Pulm toilet  FEN - NPO, NGT to LIWS, IVF.  VTE - SCDs, hold Lovenox   ID - Zosyn , Micafungin   Foley - removed 7/13, voiding  Hx of UC on 40 mg prednisone  per day - on solucortef currently. TRH consult to assist with stress dose steroid dosing. Hypothyroidism - TRH recommends if patient will be npo x >72  hours to switch to IV synthroid   Hematuria - TRH recommends repeating UA sometime later in course to ensure clearance of microscopic hematuria  BRBPR - hgb stable at 10.3 this AM, hold LMWH and monitor, likely hemorrhoidal Sacral pressure wound - local wound care and pressure relief   LOS: 3 days    Burnard JONELLE Louder, [facility] 07/05/2024, 9:22 AM Please see Amion for pager number during day hours 7:00am-4:30pm

## 2024-07-05 NOTE — Progress Notes (Signed)
 Subjective: Feeling well.  She reports being started on liquids.  Objective: Vital signs in last 24 hours: Temp:  [97.4 F (36.3 C)-97.7 F (36.5 C)] 97.7 F (36.5 C) (07/15 1156) Pulse Rate:  [84-100] 92 (07/15 1156) Resp:  [13-23] 14 (07/15 1156) BP: (105-143)/(71-87) 117/74 (07/15 1156) SpO2:  [95 %-96 %] 96 % (07/15 1156) Weight:  [60.1 kg] 60.1 kg (07/14 1748) Last BM Date : 07/03/24  Intake/Output from previous day: 07/14 0701 - 07/15 0700 In: 262.9 [IV Piggyback:262.9] Out: 540 [Urine:300; Emesis/NG output:200; Drains:40] Intake/Output this shift: No intake/output data recorded.  General appearance: alert and no distress GI: tender at the incision site  Lab Results: Recent Labs    07/03/24 0658 07/04/24 0556  WBC 13.6* 9.5  HGB 9.8* 10.3*  HCT 31.8* 34.0*  PLT 206 206   BMET Recent Labs    07/03/24 0658 07/04/24 0556  NA 133* 134*  K 4.0 4.0  CL 101 102  CO2 23 20*  GLUCOSE 73 55*  BUN 17 18  CREATININE 0.52 0.58  CALCIUM  8.7* 8.4*   LFT No results for input(s): PROT, ALBUMIN , AST, ALT, ALKPHOS, BILITOT, BILIDIR, IBILI in the last 72 hours. PT/INR No results for input(s): LABPROT, INR in the last 72 hours. Hepatitis Panel No results for input(s): HEPBSAG, HCVAB, HEPAIGM, HEPBIGM in the last 72 hours. C-Diff No results for input(s): CDIFFTOX in the last 72 hours. Fecal Lactopherrin No results for input(s): FECLLACTOFRN in the last 72 hours.  Studies/Results: DG UGI W SINGLE CM (SOL OR THIN BA) Result Date: 07/05/2024 CLINICAL DATA:  Patient with perforated pyloric channel peptic ulcer, s/p graham patch repair 07/02/24. evaluate for leak. EXAM: DG UGI W SINGLE CM TECHNIQUE: Scout radiograph was obtained. Patient was given small amount of thin barium by mouth but unable to drink significant amount. 80 mL of Omnipaque  300 was pushed via NG tube. Exam was significantly limited due to patient mobility, decreased oral  intake ability. This exam was performed by Kimble Clas, PA-C, and was supervised and interpreted by Dr. Vanice. FLUOROSCOPY: Radiation Exposure Index (as provided by the fluoroscopic device): 19.60 mGy Kerma COMPARISON:  None Available. FINDINGS: Esophagus: Not evaluated due to limited study Esophageal motility: Not evaluated due to limited study Gastroesophageal reflux: Not evaluated due to limited study Ingested 13 mm barium tablet: Not given Stomach: Normal appearance. No evidence of leak Gastric emptying: Normal. Duodenum: Normal appearance. Other:  None. IMPRESSION: Limited study. No evidence of any significant gastric leak or contrast extravasation. Normal passage of contrast into the duodenum and distal small intestine. Electronically Signed   By: CHRISTELLA.  Shick M.D.   On: 07/05/2024 09:47    Medications: Scheduled:  sodium chloride    Intravenous Once   acetaminophen   1,000 mg Per Tube Q6H   ascorbic acid   500 mg Oral Daily   [START ON 07/06/2024] enoxaparin  (LOVENOX ) injection  40 mg Subcutaneous Daily   feeding supplement (KATE FARMS STANDARD 1.4)  325 mL Oral TID BM   methylPREDNISolone  (SOLU-MEDROL ) injection  40 mg Intravenous Daily   multivitamin with minerals  1 tablet Oral Daily   pantoprazole  (PROTONIX ) IV  40 mg Intravenous Q12H   thiamine   100 mg Oral Daily   zinc  sulfate (50mg  elemental zinc )  220 mg Oral Daily   Continuous:  sodium chloride  75 mL/hr at 07/05/24 1035   micafungin  (MYCAMINE ) 100 mg in sodium chloride  0.9 % 100 mL IVPB 100 mg (07/05/24 1040)   piperacillin -tazobactam (ZOSYN )  IV 3.375 g (07/05/24  9483)    Assessment/Plan: 1) Perforated pyloric channel ulcer s/p [name]. 2) Pan  Ulcerative Colitis.   The patient is stable.  She was not noted to have any leak with the UGI series.  A full liquid diet was started.  Infliximab  was ordered, however she is s/p surgery the medication needs to be held for two weeks.  This issue was discussed with Surgery.  She is  stable with her UC and maintains Solu-Medrol .  Plan: 1) Continue with routine post-operative care. 2) Most likely her infliximab  will be started as an outpatient.  LOS: 3 days   Jatniel Verastegui D 07/05/2024, 1:59 PM

## 2024-07-05 NOTE — Progress Notes (Signed)
 Nutrition Follow-up  DOCUMENTATION CODES:   Severe malnutrition in context of acute illness/injury  INTERVENTION:   Encourage PO intake - currently on FLD [employer] 1.4 PO TID, each supplement provides 455 kcal and 20 grams protein. MVI with minerals daily  100 mg Thiamine  x 7 days  500 mg Vitamin C  and 220 mg Zinc  sulfate x 14 days for wound healing Pt will be at refeeding risk Monitor magnesium, potassium, and phosphorus daily for at least 3 days, MD to replete as needed If pt does not tolerate FLD recommend starting TPN  *Pt is vegetarian but still consumes dairy, avoids beef collagen and food dyes *Pt cannot have Juven or Prosource as it contains beef collagen    NUTRITION DIAGNOSIS:   Severe Malnutrition related to acute illness as evidenced by energy intake < or equal to 50% for > or equal to 5 days, severe muscle depletion, severe fat depletion. - Still applicable   GOAL:   Patient will meet greater than or equal to 90% of their needs - Progressing   MONITOR:   Diet advancement, Weight trends, I & O's, Labs, Skin  REASON FOR ASSESSMENT:   Consult Wound healing  ASSESSMENT:   73 y.o. female who presented 07/01/24 with severe abdominal pain. Pt found to have perforated pyloric channel peptic ulcer, s/p ex lap with repair 7/12. PMH: ulcerative colitis.  7/12 - s/p ex lap with [name] repair of perforated pyloric channel peptic ulcer  7/15 - Diet advanced to FLD, NGT removed   Pt advanced to clears today, discussed dietary preferences with pt and pt avoids beef collagen and food dyes. Pt cannot have any supplements approved on a CLD as they contain one or both of these ingredients. Messaged Burnard Louder, [place], ok to advance to FLD. Will add [employer]. Has two stage 3 pressure injuries on buttocks, pt cannot have Juven due to it containing beef collagen, will add Vitamin C  and Zinc  to supplement.  Pt is a picky eater at baseline. Took patients lunch order on visit.  Pt denies N/V. Had BM yesterday.    Admit weight: 59 kg Current weight: 60.1 kg   Nutritionally Relevant Medications: Scheduled Meds:  sodium chloride    Intravenous Once   acetaminophen   1,000 mg Per Tube Q6H   ascorbic acid   500 mg Oral Daily   [START ON 07/06/2024] enoxaparin  (LOVENOX ) injection  40 mg Subcutaneous Daily   feeding supplement (KATE FARMS STANDARD 1.4)  325 mL Oral TID BM   methylPREDNISolone  (SOLU-MEDROL ) injection  40 mg Intravenous Daily   multivitamin with minerals  1 tablet Oral Daily   pantoprazole  (PROTONIX ) IV  40 mg Intravenous Q12H   thiamine   100 mg Oral Daily   zinc  sulfate (50mg  elemental zinc )  220 mg Oral Daily   Labs Reviewed: Sodium 134 Calcium  8.4 Albumin  1.9 AST 13 Total protein 4.3 CBG ranges from 55-73 mg/dL over the last 24 hours No A1C  Diet Order:   Diet Order             Diet full liquid Room service appropriate? Yes; Fluid consistency: Thin  Diet effective now                   EDUCATION NEEDS:   Education needs have been addressed  Skin:  Skin Assessment: Skin Integrity Issues: Skin Integrity Issues:: Stage III Stage III: L and R buttocks  Last BM:  07/04/2024  Height:   Ht Readings from Last 1 Encounters:  07/01/24 5' 2 (1.575 m)    Weight:   Wt Readings from Last 1 Encounters:  07/04/24 60.1 kg    Ideal Body Weight:  50 kg  BMI:  Body mass index is 24.23 kg/m.  Estimated Nutritional Needs:   Kcal:  1500-1700 kcal  Protein:  80-100 gm  Fluid:  >1.5L/day   Olivia Kenning, RD Registered Dietitian  See Amion for more information

## 2024-07-05 NOTE — Progress Notes (Addendum)
 Infliximab  consult   Due to recent surgery, infliximab  being discontinued  Will receive as outpatient Pharmacy to sign off   Rankin Sams, PharmD, BCPS, BCCCP Clinical Pharmacist

## 2024-07-05 NOTE — Plan of Care (Signed)
   Problem: Education: Goal: Knowledge of General Education information will improve Description Including pain rating scale, medication(s)/side effects and non-pharmacologic comfort measures Outcome: Progressing   Problem: Health Behavior/Discharge Planning: Goal: Ability to manage health-related needs will improve Outcome: Progressing

## 2024-07-05 NOTE — Progress Notes (Signed)
   Inpatient Rehabilitation Admissions Coordinator   I await medical progress before beginning Auth for possible Cir admit. Noted Dr Babs consult on 7/14.  Heron Leavell, RN, MSN Rehab Admissions Coordinator [phone] 07/05/2024 11:57 AM

## 2024-07-06 DIAGNOSIS — K3189 Other diseases of stomach and duodenum: Secondary | ICD-10-CM | POA: Diagnosis not present

## 2024-07-06 LAB — CULTURE, BLOOD (ROUTINE X 2)
Culture: NO GROWTH
Culture: NO GROWTH
Special Requests: ADEQUATE
Special Requests: ADEQUATE

## 2024-07-06 LAB — BASIC METABOLIC PANEL WITH GFR
Anion gap: 10 (ref 5–15)
BUN: 17 mg/dL (ref 8–23)
CO2: 26 mmol/L (ref 22–32)
Calcium: 8.5 mg/dL — ABNORMAL LOW (ref 8.9–10.3)
Chloride: 100 mmol/L (ref 98–111)
Creatinine, Ser: 0.58 mg/dL (ref 0.44–1.00)
GFR, Estimated: >60 mL/min (ref >60)
Glucose, Bld: 167 mg/dL — ABNORMAL HIGH (ref 70–99)
Potassium: 3.8 mmol/L (ref 3.5–5.1)
Sodium: 136 mmol/L (ref 135–145)

## 2024-07-06 LAB — CBC
HCT: 32 % — ABNORMAL LOW (ref 36.0–46.0)
Hemoglobin: 9.9 g/dL — ABNORMAL LOW (ref 12.0–15.0)
MCH: 27.3 pg (ref 26.0–34.0)
MCHC: 30.9 g/dL (ref 30.0–36.0)
MCV: 88.4 fL (ref 80.0–100.0)
Platelets: 270 K/uL (ref 150–400)
RBC: 3.62 MIL/uL — ABNORMAL LOW (ref 3.87–5.11)
RDW: 17.7 % — ABNORMAL HIGH (ref 11.5–15.5)
WBC: 6.4 K/uL (ref 4.0–10.5)
nRBC: 0 % (ref 0.0–0.2)

## 2024-07-06 LAB — PHOSPHORUS: Phosphorus: 2.4 mg/dL — ABNORMAL LOW (ref 2.5–4.6)

## 2024-07-06 LAB — MAGNESIUM: Magnesium: 2.1 mg/dL (ref 1.7–2.4)

## 2024-07-06 MED ORDER — LEVOTHYROXINE SODIUM 112 MCG PO TABS
112.0000 ug | ORAL_TABLET | Freq: Every day | ORAL | Status: DC
  Administered 2024-07-06 – 2024-07-18 (×12): 112 ug via ORAL
  Filled 2024-07-06 (×14): qty 1

## 2024-07-06 MED ORDER — TRAMADOL HCL 50 MG PO TABS: 50.0000 mg | ORAL_TABLET | Freq: Four times a day (QID) | ORAL | Status: DC | PRN

## 2024-07-06 MED ORDER — ACETAMINOPHEN 500 MG PO TABS
1000.0000 mg | ORAL_TABLET | Freq: Four times a day (QID) | ORAL | Status: DC
  Administered 2024-07-07: 1000 mg via ORAL
  Filled 2024-07-06 (×8): qty 2

## 2024-07-06 NOTE — Plan of Care (Signed)
   Problem: Education: Goal: Knowledge of General Education information will improve Description: Including pain rating scale, medication(s)/side effects and non-pharmacologic comfort measures Outcome: Progressing   Problem: Health Behavior/Discharge Planning: Goal: Ability to manage health-related needs will improve Outcome: Progressing   Problem: Activity: Goal: Risk for activity intolerance will decrease Outcome: Progressing

## 2024-07-06 NOTE — Progress Notes (Signed)
 Occupational Therapy Treatment Patient Details Name: Allison Finley MRN: [id number] DOB: Sep 17, 1951 Today's Date: 07/06/2024   History of present illness Pt is a 73 y.o. female who presented 07/01/24 with severe abdominal pain. Pt found to have perforated pyloric channel peptic ulcer, s/p ex lap with repair 7/12. PMH: ulcerative colitis   OT comments  Pt is making steady progress towards their acute OT goals. Pt remains limited by weakness, activity tolerance and report of dizziness. Overall she needed max A for face to face transfers, mod A to gain static standing balance and progressed to CGA for standing balance and short mobility with RW. Pt completed peri hygiene with set up and superivsion A after BSC use. BP stable throughout and no nystagmus noted despite report of intermittent dizziness and feeling faint. OT to continue to follow acutely to facilitate progress towards established goals. Pt will continue to benefit from intensive inpatient follow up therapy, >3 hours/day after discharge.        If plan is discharge home, recommend the following:  Assist for transportation;Assistance with cooking/housework;A lot of help with bathing/dressing/bathroom;A little help with walking and/or transfers   Equipment Recommendations  None recommended by OT       Precautions / Restrictions Precautions Precautions: Fall Precaution/Restrictions Comments: R JP Restrictions Weight Bearing Restrictions Per Provider Order: No       Mobility Bed Mobility     General bed mobility comments: OOB in recliner on arrival    Transfers Overall transfer level: Needs assistance Equipment used: 1 person hand held assist, Rolling walker (2 wheels) Transfers: Sit to/from Stand Sit to Stand: Max assist           General transfer comment: unable to stand with RW places in front of her despite max A to boost. Pt required max A face to face transfer, mod A for initial balance in standing  and progressed to CGA for balance and short mobility once RW was put back in front of her.     Balance Overall balance assessment: Needs assistance Sitting-balance support: No upper extremity supported, Feet supported Sitting balance-Leahy Scale: Fair     Standing balance support: Reliant on assistive device for balance Standing balance-Leahy Scale: Poor             ADL either performed or assessed with clinical judgement   ADL Overall ADL's : Needs assistance/impaired             Toilet Transfer: Maximal assistance Toilet Transfer Details (indicate cue type and reason): max A for STS, once standing with RW pt is CGA Toileting- Clothing Manipulation and Hygiene: Sitting/lateral lean       Functional mobility during ADLs: Maximal assistance;Rolling walker (2 wheels) General ADL Comments: increased global weakness, required more assist for transfers. poor activity tolerance for ADLs and mobility. Dizziness noted with transition, BP stable, no nystagmus    Extremity/Trunk Assessment Upper Extremity Assessment Upper Extremity Assessment: Defer to OT evaluation   Lower Extremity Assessment Lower Extremity Assessment: Defer to PT evaluation        Vision   Vision Assessment?: No apparent visual deficits   Perception Perception Perception: Within Functional Limits   Praxis Praxis Praxis: WFL   Communication Communication Communication: No apparent difficulties   Cognition Arousal: Alert Behavior During Therapy: WFL for tasks assessed/performed Cognition: No apparent impairments             Following commands: Intact        Cueing   Cueing Techniques: Verbal  cues        General Comments BP 133/65, pt reported dizziness once standing from the chair and after short mobility    Pertinent Vitals/ Pain       Pain Assessment Pain Assessment: Faces Faces Pain Scale: Hurts a little bit Pain Location: L abdomen Pain Descriptors / Indicators: Discomfort,  Grimacing, Guarding, Operative site guarding Pain Intervention(s): Limited activity within patient's tolerance, Monitored during session   Frequency  Min 2X/week        Progress Toward Goals  OT Goals(current goals can now be found in the care plan section)  Progress towards OT goals: Progressing toward goals  Acute Rehab OT Goals Patient Stated Goal: to get stronger OT Goal Formulation: With patient Time For Goal Achievement: 07/18/24 Potential to Achieve Goals: Good ADL Goals Pt Will Perform Grooming: with modified independence;standing Pt Will Perform Lower Body Bathing: with modified independence;sit to/from stand Pt Will Perform Lower Body Dressing: with modified independence;sit to/from stand Pt Will Transfer to Toilet: with modified independence;regular height toilet;ambulating   AM-PAC OT 6 Clicks Daily Activity     Outcome Measure   Help from another person eating meals?: A Little Help from another person taking care of personal grooming?: A Little Help from another person toileting, which includes using toliet, bedpan, or urinal?: A Lot Help from another person bathing (including washing, rinsing, drying)?: A Lot Help from another person to put on and taking off regular upper body clothing?: A Lot Help from another person to put on and taking off regular lower body clothing?: A Lot 6 Click Score: 14    End of Session Equipment Utilized During Treatment: Gait belt;Rolling walker (2 wheels)  OT Visit Diagnosis: Unsteadiness on feet (R26.81);Muscle weakness (generalized) (M62.81)   Activity Tolerance Patient tolerated treatment well   Patient Left with call bell/phone within reach   Nurse Communication Mobility status        Time: 1053-1130 OT Time Calculation (min): 37 min  Charges: OT General Charges $OT Visit: 1 Visit  Lucie Kendall, OTR/L Acute Rehabilitation Services Office [phone] Secure Chat Communication Preferred   Lucie JONETTA Kendall 07/06/2024, 11:31 AM

## 2024-07-06 NOTE — Progress Notes (Signed)
   Inpatient Rehabilitation Admissions Coordinator   I met with patient at bedside. I await further medical progress before beginning Auth with [facility] for possible Cir admit.  Heron Leavell, RN, MSN Rehab Admissions Coordinator [phone] 07/06/2024 2:09 PM

## 2024-07-06 NOTE — Plan of Care (Signed)

## 2024-07-06 NOTE — Consult Note (Signed)
 WOC team consulted for sacral wound. See consult note 07/03/2024, wound care orders in place.    Reconsult if further needs arise.   Thank you,    Powell Bar MSN, RN-BC, [employer]

## 2024-07-06 NOTE — Progress Notes (Signed)
 Subjective: Patient gives a history of having bloody BMs today; she describes a pool of blood mixed with stool after she had a BM.  She has had 2 bloody bowel movements yesterday.  She is tolerating a full liquid diet well but feels that she has loose stools every time she eats a meal.  She denies have any nausea or vomiting. Her abdominal pain seems to be improving. Stool H. pylori antigen is pending.  Her hemoglobin is slightly decreased from 10.3 g/dL to 9.9 g/dL today.  Objective: Vital signs in last 24 hours: Temp:  [97.4 F (36.3 C)-97.9 F (36.6 C)] 97.9 F (36.6 C) (07/16 0826) Pulse Rate:  [86-100] 90 (07/16 0826) Resp:  [17-20] 18 (07/16 0826) BP: (112-129)/(73-89) 115/87 (07/16 0826) SpO2:  [94 %-96 %] 96 % (07/16 0826) Last BM Date : 07/03/24  Intake/Output from previous day: 07/15 0701 - 07/16 0700 In: 1419 [I.V.:1196.7; IV Piggyback:222.3] Out: 150 [Drains:150] Intake/Output this shift: Total I/O In: -  Out: 35 [Drains:35]  General appearance: alert, cooperative, appears stated age, and no distress Resp: clear to auscultation bilaterally Cardio: regular rate and rhythm, S1, S2 normal, no murmur, click, rub or gallop GI: soft, non-tender; bowel sounds normal; no masses,  no organomegaly  Lab Results: Recent Labs    07/04/24 0556 07/06/24 0451  WBC 9.5 6.4  HGB 10.3* 9.9*  HCT 34.0* 32.0*  PLT 206 270   BMET Recent Labs    07/04/24 0556 07/06/24 0451  NA 134* 136  K 4.0 3.8  CL 102 100  CO2 20* 26  GLUCOSE 55* 167*  BUN 18 17  CREATININE 0.58 0.58  CALCIUM  8.4* 8.5*   Studies/Results: DG UGI W SINGLE CM (SOL OR THIN BA) Result Date: 07/05/2024 CLINICAL DATA:  Patient with perforated pyloric channel peptic ulcer, s/p graham patch repair 07/02/24. evaluate for leak. EXAM: DG UGI W SINGLE CM TECHNIQUE: Scout radiograph was obtained. Patient was given small amount of thin barium by mouth but unable to drink significant amount. 80 mL of Omnipaque  300 was  pushed via NG tube. Exam was significantly limited due to patient mobility, decreased oral intake ability. This exam was performed by Kimble Clas, PA-C, and was supervised and interpreted by Dr. Vanice. FLUOROSCOPY: Radiation Exposure Index (as provided by the fluoroscopic device): 19.60 mGy Kerma COMPARISON:  None Available. FINDINGS: Esophagus: Not evaluated due to limited study Esophageal motility: Not evaluated due to limited study Gastroesophageal reflux: Not evaluated due to limited study Ingested 13 mm barium tablet: Not given Stomach: Normal appearance. No evidence of leak Gastric emptying: Normal. Duodenum: Normal appearance. Other:  None. IMPRESSION: Limited study. No evidence of any significant gastric leak or contrast extravasation. Normal passage of contrast into the duodenum and distal small intestine. Electronically Signed   By: CHRISTELLA.  Shick M.D.   On: 07/05/2024 09:47    Medications: I have reviewed the patient's current medications. Prior to Admission:  Medications Prior to Admission  Medication Sig Dispense Refill Last Dose/Taking   clobetasol cream (TEMOVATE) 0.05 % Apply 1 Application topically daily as needed (psoriasis).   Unknown   Cyanocobalamin (VITAMIN B-12 PO) Take 1 capsule by mouth daily.   07/01/2024   fluticasone (FLONASE) 50 MCG/ACT nasal spray Place 2 sprays into both nostrils daily as needed for allergies.   Unknown   levothyroxine  (SYNTHROID ) 112 MCG tablet Take 112 mcg by mouth daily.   07/01/2024   omeprazole  (PRILOSEC) 40 MG capsule Take 40 mg by mouth daily as needed (reflux).  Unknown   [EXPIRED] predniSONE  (DELTASONE ) 10 MG tablet Take 4 tablets (40 mg total) by mouth daily for 10 days, THEN 3 tablets (30 mg total) daily for 10 days, THEN 2 tablets (20 mg total) daily for 10 days, THEN 1 tablet (10 mg total) daily for 10 days. (Patient taking differently: Take 20 mg by mouth in the morning and 20 mg by mouth at night ) 100 tablet 0 07/01/2024   rosuvastatin  (CRESTOR )  5 MG tablet Take 5 mg by mouth at bedtime.   06/30/2024   Zinc  Oxide-Vitamin C  (ZINC  PLUS VITAMIN C  PO) Take 1 tablet by mouth daily.   07/01/2024   mesalamine (APRISO) 0.375 g 24 hr capsule Take 1.5 g by mouth every morning. (Patient not taking: Reported on 07/02/2024)   Not Taking   Scheduled:  sodium chloride    Intravenous Once   acetaminophen   1,000 mg Per Tube Q6H   ascorbic acid   500 mg Oral Daily   enoxaparin  (LOVENOX ) injection  40 mg Subcutaneous Daily   feeding supplement (KATE FARMS STANDARD 1.4)  325 mL Oral TID BM   levothyroxine   112 mcg Oral Daily   methylPREDNISolone  (SOLU-MEDROL ) injection  40 mg Intravenous Daily   multivitamin with minerals  1 tablet Oral Daily   pantoprazole  (PROTONIX ) IV  40 mg Intravenous Q12H   thiamine   100 mg Oral Daily   zinc  sulfate (50mg  elemental zinc )  220 mg Oral Daily   Continuous:  piperacillin -tazobactam (ZOSYN )  IV 3.375 g (07/06/24 1501)    Assessment/Plan: 1) Perforated pyloric channel ulcer-status post laparotomy with [name] repair of pyloric channel ulcer POD#4-on IV Protonix .  Stool antigen for H. pylori is pending. 2) Ulcerative colitis/abnormal CT scan on IV Solu-Medrol . Orders for infliximab  have been written.Once she gets the infliximab  the steroids will be stopped within the next week after the patients have been 2 weeks out of surgery.. 3) Iron  deficiency anemia. 4) Hypothyroidism. 5) Hyperlipidemia. 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 7) Large cystocele with pelvic floor laxity and possible vaginal prolapse. 8) Lumbar spondylosis with degenerative disc disease causing foraminal impingement L4-L5 and L5-S1. 9) Severe protein calorie malnutrition with albumin  of 1.9.  LOS: 4 days   Renaye Sous 07/06/2024, 5:02 PM

## 2024-07-06 NOTE — Progress Notes (Signed)
 5 Days Post-Op  Subjective: Pt with loose stools yesterday afternoon but reports more solid stool last night. No further bloody stools. Tolerating FLD and denies nausea or vomiting. She is concerned about ankle swelling bilaterally, discussed this is likely from the fluids she was on while NPO.    Objective: Vital signs in last 24 hours: Temp:  [97.4 F (36.3 C)-97.9 F (36.6 C)] 97.9 F (36.6 C) (07/16 0826) Pulse Rate:  [86-100] 90 (07/16 0826) Resp:  [14-20] 18 (07/16 0826) BP: (112-129)/(73-89) 115/87 (07/16 0826) SpO2:  [94 %-96 %] 96 % (07/16 0826) Last BM Date : 07/03/24  Intake/Output from previous day: 07/15 0701 - 07/16 0700 In: 1419 [I.V.:1196.7; IV Piggyback:222.3] Out: 150 [Drains:150] Intake/Output this shift: No intake/output data recorded.  PE: Gen:  Alert, NAD, pleasant Card:  Reg Pulm:  Rate and effort normal Abd: Soft, mild distension, appropriately tender around incision, no rigidity or guarding and otherwise NT. Incision with honeycomb dressing in place, cdi. JP drain serous - 150cc/24 hours. Ext:  mild edema of BL ankles, no erythema or significant TTP of BL calves Psych: A&Ox3   Lab Results:  Recent Labs    07/04/24 0556 07/06/24 0451  WBC 9.5 6.4  HGB 10.3* 9.9*  HCT 34.0* 32.0*  PLT 206 270   BMET Recent Labs    07/04/24 0556 07/06/24 0451  NA 134* 136  K 4.0 3.8  CL 102 100  CO2 20* 26  GLUCOSE 55* 167*  BUN 18 17  CREATININE 0.58 0.58  CALCIUM  8.4* 8.5*   PT/INR No results for input(s): LABPROT, INR in the last 72 hours.  CMP     Component Value Date/Time   NA 136 07/06/2024 0451   K 3.8 07/06/2024 0451   CL 100 07/06/2024 0451   CO2 26 07/06/2024 0451   GLUCOSE 167 (H) 07/06/2024 0451   BUN 17 07/06/2024 0451   CREATININE 0.58 07/06/2024 0451   CALCIUM  8.5 (L) 07/06/2024 0451   PROT 4.3 (L) 07/02/2024 0629   ALBUMIN  1.9 (L) 07/02/2024 0629   AST 13 (L) 07/02/2024 0629   ALT 16 07/02/2024 0629   ALKPHOS 43  07/02/2024 0629   BILITOT 0.8 07/02/2024 0629   GFRNONAA >60 07/06/2024 0451   Lipase     Component Value Date/Time   LIPASE <10 (L) 08/02/2021 2201    Studies/Results: DG UGI W SINGLE CM (SOL OR THIN BA) Result Date: 07/05/2024 CLINICAL DATA:  Patient with perforated pyloric channel peptic ulcer, s/p graham patch repair 07/02/24. evaluate for leak. EXAM: DG UGI W SINGLE CM TECHNIQUE: Scout radiograph was obtained. Patient was given small amount of thin barium by mouth but unable to drink significant amount. 80 mL of Omnipaque  300 was pushed via NG tube. Exam was significantly limited due to patient mobility, decreased oral intake ability. This exam was performed by Kimble Clas, PA-C, and was supervised and interpreted by Dr. Vanice. FLUOROSCOPY: Radiation Exposure Index (as provided by the fluoroscopic device): 19.60 mGy Kerma COMPARISON:  None Available. FINDINGS: Esophagus: Not evaluated due to limited study Esophageal motility: Not evaluated due to limited study Gastroesophageal reflux: Not evaluated due to limited study Ingested 13 mm barium tablet: Not given Stomach: Normal appearance. No evidence of leak Gastric emptying: Normal. Duodenum: Normal appearance. Other:  None. IMPRESSION: Limited study. No evidence of any significant gastric leak or contrast extravasation. Normal passage of contrast into the duodenum and distal small intestine. Electronically Signed   By: CHRISTELLA.  Shick M.D.  On: 07/05/2024 09:47     Anti-infectives: Anti-infectives (From admission, onward)    Start     Dose/Rate Route Frequency Ordered Stop   07/02/24 1000  piperacillin -tazobactam (ZOSYN ) IVPB 3.375 g        3.375 g 12.5 mL/hr over 240 Minutes Intravenous Every 8 hours 07/02/24 0440 07/07/24 0559   07/02/24 0500  micafungin  (MYCAMINE ) 100 mg in sodium chloride  0.9 % 100 mL IVPB        100 mg 105 mL/hr over 1 Hours Intravenous Daily 07/02/24 0315 07/07/24 0959   07/02/24 0145  piperacillin -tazobactam (ZOSYN )  IVPB 3.375 g        3.375 g 100 mL/hr over 30 Minutes Intravenous  Once 07/02/24 0134 07/02/24 0354   07/01/24 2237  ceFAZolin  (ANCEF ) 2-4 GM/100ML-% IVPB       Note to Pharmacy: Roddie Grate: cabinet override      07/01/24 2237 07/02/24 1044        Assessment/Plan POD4 s/p Exploratory laparotomy with [name] repair of perforated peptic ulcer by Dr. Teresa on 07/02/24 for Perforated pyloric channel peptic ulcer  - UGI yesterday negative - tol FLD, advance to soft diet - Cont JP. SS - Cont IV abx and antifungals, plan 5d post op - Cont BID PPI - Check H. Pylori - pending currently - PRN IV pain meds. Avoid NSAIDs - Mobilize, PT - recs for CIR currently  - Pulm toilet  FEN - Soft diet, SLIV VTE - SCDs, ok to resume Lovenox   ID - Zosyn , Micafungin   Foley - removed 7/13, voiding  Hx of UC on 40 mg prednisone  per day - on solucortef currently. TRH consult to assist with stress dose steroid dosing. Hypothyroidism - TRH recommends if patient will be npo x >72 hours to switch to IV synthroid   Hematuria - TRH recommends repeating UA sometime later in course to ensure clearance of microscopic hematuria  BRBPR - no further bleeding, hgb 9.9 from 10.3, continue to monitor  Sacral pressure wound - local wound care and pressure relief   LOS: 4 days    Burnard JONELLE Louder, [facility] 07/06/2024, 9:20 AM Please see Amion for pager number during day hours 7:00am-4:30pm

## 2024-07-06 NOTE — Plan of Care (Signed)
  Problem: Education: Goal: Knowledge of General Education information will improve Description: Including pain rating scale, medication(s)/side effects and non-pharmacologic comfort measures Outcome: Progressing   Problem: Health Behavior/Discharge Planning: Goal: Ability to manage health-related needs will improve Outcome: Progressing   Problem: Clinical Measurements: Goal: Will remain free from infection Outcome: Progressing   Problem: Nutrition: Goal: Adequate nutrition will be maintained Outcome: Progressing   Problem: Elimination: Goal: Will not experience complications related to bowel motility Outcome: Progressing Goal: Will not experience complications related to urinary retention Outcome: Progressing   Problem: Pain Managment: Goal: General experience of comfort will improve and/or be controlled Outcome: Progressing   Problem: Skin Integrity: Goal: Risk for impaired skin integrity will decrease Outcome: Progressing

## 2024-07-06 NOTE — Progress Notes (Signed)
 Physical Therapy Treatment Patient Details Name: Allison Finley MRN: [id number] DOB: December 26, 1950 Today's Date: 07/06/2024   History of Present Illness Pt is a 73 y.o. female who presented 07/01/24 with severe abdominal pain. Pt found to have perforated pyloric channel peptic ulcer, s/p ex lap with repair 7/12. PMH: ulcerative colitis    PT Comments  The pt is demonstrating deficits in lower extremity strength along with initiation and comprehension to extend her knees and hips to transfer sit to stand, thereby requiring maxA this date. Once standing, she only needed minA to step pivot to/from bedside commode with RW support though. She politely declined further gait attempts due to bowel urgency and lightheadedness this date (SBP appeared stable). Focused remainder of session on strengthening her quads instead. She is highly motivated to participate and improve. Will continue to follow acutely.   If plan is discharge home, recommend the following: A lot of help with walking and/or transfers;A lot of help with bathing/dressing/bathroom;Assistance with cooking/housework;Assist for transportation;Help with stairs or ramp for entrance   Can travel by private vehicle        Equipment Recommendations  None recommended by PT (defer to next venue of care)    Recommendations for Other Services       Precautions / Restrictions Precautions Precautions: Fall Precaution/Restrictions Comments: abdominal precautions; R JP drain Restrictions Weight Bearing Restrictions Per Provider Order: No     Mobility  Bed Mobility               General bed mobility comments: Pt OOB in recliner upon arrival and at end of session    Transfers Overall transfer level: Needs assistance Equipment used: Rolling walker (2 wheels) Transfers: Sit to/from Stand, Bed to chair/wheelchair/BSC Sit to Stand: Max assist   Step pivot transfers: Min assist       General transfer comment: Pt needed cues  for hand placement and to extend her knees and hips with each transfer to stand. Unsuccessful the first rep, but successful the second rep from recliner with maxA. Pt stood a third time from bedside commode with maxA at hips to extend and power pt up to stand, cuing pt to then push down on RW to provide support once upright. MinA needed for balance to step pivot bedside commode <> recliner.    Ambulation/Gait Ambulation/Gait assistance: Min assist Gait Distance (Feet): 2 Feet (x2 bouts of ~2 ft each bout) Assistive device: Rolling walker (2 wheels) Gait Pattern/deviations: Step-through pattern, Decreased step length - left, Decreased step length - right, Decreased stride length, Narrow base of support, Trunk flexed Gait velocity: reduced Gait velocity interpretation: <1.31 ft/sec, indicative of household ambulator   General Gait Details: Pt takes slow, small steps bedside commode <> recliner with minA for balance. Pt politely declining further gait attempts due to feeling lightheaded at this time   Stairs             Wheelchair Mobility     Tilt Bed    Modified Rankin (Stroke Patients Only)       Balance Overall balance assessment: Needs assistance Sitting-balance support: No upper extremity supported, Feet supported Sitting balance-Leahy Scale: Good     Standing balance support: Reliant on assistive device for balance, Bilateral upper extremity supported, During functional activity Standing balance-Leahy Scale: Poor Standing balance comment: reliant on RW                            Communication Communication  Communication: No apparent difficulties  Cognition Arousal: Alert Behavior During Therapy: WFL for tasks assessed/performed   PT - Cognitive impairments: Initiation, Awareness, Problem solving, Sequencing                       PT - Cognition Comments: poor comprehension/sequencing on extending her legs to stand Following commands:  Impaired Following commands impaired: Follows multi-step commands with increased time    Cueing Cueing Techniques: Verbal cues, Tactile cues  Exercises General Exercises - Lower Extremity Long Arc Quad: AROM, Strengthening, Both, 10 reps, Seated (x2 sets, 1 set with 5 second isometric hold at full extension and eccentric control)    General Comments General comments (skin integrity, edema, etc.): SBP 130s sitting after transferring to recliner from commode with pt reporting lightheadedness      Pertinent Vitals/Pain Pain Assessment Pain Assessment: Faces Faces Pain Scale: Hurts a little bit Pain Location: L abdomen, buttocks Pain Descriptors / Indicators: Discomfort, Grimacing, Guarding, Operative site guarding Pain Intervention(s): Limited activity within patient's tolerance, Monitored during session, Repositioned    Home Living                          Prior Function            PT Goals (current goals can now be found in the care plan section) Acute Rehab PT Goals Patient Stated Goal: to return to being independent PT Goal Formulation: With patient Time For Goal Achievement: 07/16/24 Potential to Achieve Goals: Good Progress towards PT goals: Not progressing toward goals - comment (functional decline)    Frequency    Min 2X/week      PT Plan      Co-evaluation              AM-PAC PT 6 Clicks Mobility   Outcome Measure  Help needed turning from your back to your side while in a flat bed without using bedrails?: A Little Help needed moving from lying on your back to sitting on the side of a flat bed without using bedrails?: A Little Help needed moving to and from a bed to a chair (including a wheelchair)?: A Lot Help needed standing up from a chair using your arms (e.g., wheelchair or bedside chair)?: A Lot Help needed to walk in hospital room?: Total Help needed climbing 3-5 steps with a railing? : Total 6 Click Score: 12    End of Session  Equipment Utilized During Treatment: Gait belt Activity Tolerance: Patient tolerated treatment well;Other (comment) (limited by lightheadedness and bloody stools and bowel urgency) Patient left: in chair;with call bell/phone within reach;Other (comment) (no chair alarm present upon arrival) Nurse Communication: Mobility status;Other (comment) (bloody stool; no chair alarm as there was not one present upon arrival) PT Visit Diagnosis: Unsteadiness on feet (R26.81);Other abnormalities of gait and mobility (R26.89);Muscle weakness (generalized) (M62.81);Difficulty in walking, not elsewhere classified (R26.2);Pain Pain - Right/Left:  (abdomen) Pain - part of body:  (abdomen)     Time: 8365-8295 PT Time Calculation (min) (ACUTE ONLY): 30 min  Charges:    $Therapeutic Exercise: 8-22 mins $Therapeutic Activity: 8-22 mins PT General Charges $$ ACUTE PT VISIT: 1 Visit                     Theo Ferretti, PT, DPT Acute Rehabilitation Services  Office: [phone]    Theo CHRISTELLA Ferretti 07/06/2024, 5:34 PM

## 2024-07-06 NOTE — Progress Notes (Signed)
 [facility] CONSULT PROGRESS NOTE    Progress Note  Allison Finley  FMW:[id number] DOB: [birth year] DOA: 07/01/2024 PCP: Lynwood Laneta ORN, PA-C     Brief Narrative:   Allison Finley is an 73 y.o. female past medical history of ulcerative colitis, hypothyroidism iron  deficiency anemia presented with an acute perforated peptic ulcer status post [name] on 07/02/2024  Assessment/Plan:   Gastric perforation, acute Status post exploratory laparotomy with [name] on 07/02/2024. Postop care per primary team. Upper GI series without gastric leak. Diet and antibiotics per surgery.  Currently on IV Zosyn  and micafungin . Continue Protonix  twice a day. Blood cultures remain negative till date.  Ulcerative colitis: CT scan of the abdomen and pelvis showed nonspecific changes. GI was consulted and planning for infliximab  in 2 weeks likely as an outpatient. She is currently now on IV Solu-Medrol  At home she is on 10 mg daily.  Iron  deficiency anemia/acute blood loss anemia/bright red blood per rectum: Holding Lovenox . Hemoglobin this morning is 9.9.  Sacral decubitus ulcer: Turn patient every 2 hours, continue local wound care.  Hypothyroidism: Restart home dose of Synthroid .  Protein-calorie malnutrition, severe Noted     DVT prophylaxis: Lovenox  Family Communication:none Status is: Inpatient Remains inpatient appropriate because: Acute perforated ulcer    Code Status:     Code Status Orders  (From admission, onward)           Start     Ordered   07/02/24 0058  Full code  Continuous       Question:  By:  Answer:  Default: patient does not have capacity for decision making, no surrogate or prior directive available   07/02/24 0101           Code Status History     Date Active Date Inactive Code Status Order ID Comments User Context   05/19/2024 1943 05/24/2024 2122 Full Code [id number]  MastersIzetta, DO ED      Advance  Directive Documentation    Flowsheet Row Most Recent Value  Type of Advance Directive Living will, Healthcare Power of Attorney  Pre-existing out of facility DNR order (yellow form or pink MOST form) --  MOST Form in Place? --      IV Access:   Peripheral IV   Procedures and diagnostic studies:   DG UGI W SINGLE CM (SOL OR THIN BA) Result Date: 07/05/2024 CLINICAL DATA:  Patient with perforated pyloric channel peptic ulcer, s/p graham patch repair 07/02/24. evaluate for leak. EXAM: DG UGI W SINGLE CM TECHNIQUE: Scout radiograph was obtained. Patient was given small amount of thin barium by mouth but unable to drink significant amount. 80 mL of Omnipaque  300 was pushed via NG tube. Exam was significantly limited due to patient mobility, decreased oral intake ability. This exam was performed by Kimble Clas, PA-C, and was supervised and interpreted by Dr. Vanice. FLUOROSCOPY: Radiation Exposure Index (as provided by the fluoroscopic device): 19.60 mGy Kerma COMPARISON:  None Available. FINDINGS: Esophagus: Not evaluated due to limited study Esophageal motility: Not evaluated due to limited study Gastroesophageal reflux: Not evaluated due to limited study Ingested 13 mm barium tablet: Not given Stomach: Normal appearance. No evidence of leak Gastric emptying: Normal. Duodenum: Normal appearance. Other:  None. IMPRESSION: Limited study. No evidence of any significant gastric leak or contrast extravasation. Normal passage of contrast into the duodenum and distal small intestine. Electronically Signed   By: CHRISTELLA.  Shick M.D.   On: 07/05/2024 09:47  Medical Consultants:   None.   Subjective:    Allison Finley relates her pain is controlled having watery diarrhea  Objective:    Vitals:   07/05/24 1709 07/05/24 1954 07/05/24 2239 07/06/24 0224  BP: 120/80 129/89 128/73 112/74  Pulse: 100 98 92 86  Resp: 20 19 17 18   Temp: 97.7 F (36.5 C) 97.9 F (36.6 C) 97.7 F (36.5 C)  (!) 97.4 F (36.3 C)  TempSrc: Oral Oral Oral Oral  SpO2: 96% 94% 94%   Weight:      Height:       SpO2: 94 % O2 Flow Rate (L/min): 1 L/min   Intake/Output Summary (Last 24 hours) at 07/06/2024 0614 Last data filed at 07/06/2024 0422 Gross per 24 hour  Intake 1419.01 ml  Output 120 ml  Net 1299.01 ml   Filed Weights   07/01/24 1751 07/04/24 1748  Weight: 59 kg 60.1 kg    Exam: General exam: In no acute distress. Respiratory system: Good air movement and clear to auscultation. Cardiovascular system: S1 & S2 heard, RRR. No JVD. Gastrointestinal system: Abdomen is nondistended, soft and nontender.  Extremities: No pedal edema. Skin: Stage I-II sacral decubitus ulcer Psychiatry: Judgement and insight appear normal. Mood & affect appropriate.    Data Reviewed:    Labs: Basic Metabolic Panel: Recent Labs  Lab 07/01/24 1821 07/02/24 0629 07/03/24 0658 07/04/24 0556  NA 126* 133* 133* 134*  K 4.0 3.1* 4.0 4.0  CL 94* 101 101 102  CO2 26 23 23  20*  GLUCOSE 200* 139* 73 55*  BUN 23 16 17 18   CREATININE 0.51 0.37* 0.52 0.58  CALCIUM  8.3* 7.7* 8.7* 8.4*  MG  --   --  2.0 2.0  PHOS  --   --  3.0 3.4   GFR Estimated Creatinine Clearance: 49.5 mL/min (by C-G formula based on SCr of 0.58 mg/dL). Liver Function Tests: Recent Labs  Lab 07/01/24 1821 07/02/24 0629  AST 18 13*  ALT 22 16  ALKPHOS 66 43  BILITOT 0.4 0.8  PROT 5.4* 4.3*  ALBUMIN  1.9* 1.9*   No results for input(s): LIPASE, AMYLASE in the last 168 hours. No results for input(s): AMMONIA in the last 168 hours. Coagulation profile Recent Labs  Lab 07/01/24 1821  INR 1.2   COVID-19 Labs  No results for input(s): DDIMER, FERRITIN, LDH, CRP in the last 72 hours.  No results found for: SARSCOV2NAA  CBC: Recent Labs  Lab 07/01/24 1821 07/02/24 0629 07/03/24 0658 07/04/24 0556 07/06/24 0451  WBC 11.9* 10.0 13.6* 9.5 6.4  NEUTROABS 9.4*  --   --   --   --   HGB 8.4* 8.3* 9.8*  10.3* 9.9*  HCT 27.4* 26.6* 31.8* 34.0* 32.0*  MCV 89.8 88.4 89.6 91.6 88.4  PLT 281 164 206 206 270   Cardiac Enzymes: No results for input(s): CKTOTAL, CKMB, CKMBINDEX, TROPONINI in the last 168 hours. BNP (last 3 results) No results for input(s): PROBNP in the last 8760 hours. CBG: No results for input(s): GLUCAP in the last 168 hours. D-Dimer: No results for input(s): DDIMER in the last 72 hours. Hgb A1c: No results for input(s): HGBA1C in the last 72 hours. Lipid Profile: No results for input(s): CHOL, HDL, LDLCALC, TRIG, CHOLHDL, LDLDIRECT in the last 72 hours. Thyroid function studies: No results for input(s): TSH, T4TOTAL, T3FREE, THYROIDAB in the last 72 hours.  Invalid input(s): FREET3 Anemia work up: No results for input(s): VITAMINB12, FOLATE, FERRITIN, TIBC, IRON , RETICCTPCT  in the last 72 hours. Sepsis Labs: Recent Labs  Lab 07/01/24 1841 07/01/24 2009 07/02/24 0629 07/03/24 0658 07/04/24 0556 07/06/24 0451  WBC  --   --  10.0 13.6* 9.5 6.4  LATICACIDVEN 2.4* 2.6*  --   --   --   --    Microbiology Recent Results (from the past 240 hours)  Culture, blood (Routine x 2)     Status: None (Preliminary result)   Collection Time: 07/01/24  5:50 PM   Specimen: BLOOD LEFT FOREARM  Result Value Ref Range Status   Specimen Description BLOOD LEFT FOREARM  Final   Special Requests   Final    BOTTLES DRAWN AEROBIC AND ANAEROBIC Blood Culture adequate volume   Culture   Final    NO GROWTH 4 DAYS Performed at [facility], [address], [place], [place] [address]    Report Status PENDING  Incomplete  Culture, blood (Routine x 2)     Status: None (Preliminary result)   Collection Time: 07/01/24  5:55 PM   Specimen: BLOOD  Result Value Ref Range Status   Specimen Description BLOOD RIGHT ANTECUBITAL  Final   Special Requests   Final    BOTTLES DRAWN AEROBIC AND ANAEROBIC Blood Culture adequate volume    Culture   Final    NO GROWTH 4 DAYS Performed at [facility], [address], [place], [place] [address]    Report Status PENDING  Incomplete     Medications:    sodium chloride    Intravenous Once   acetaminophen   1,000 mg Per Tube Q6H   ascorbic acid   500 mg Oral Daily   enoxaparin  (LOVENOX ) injection  40 mg Subcutaneous Daily   feeding supplement (KATE FARMS STANDARD 1.4)  325 mL Oral TID BM   methylPREDNISolone  (SOLU-MEDROL ) injection  40 mg Intravenous Daily   multivitamin with minerals  1 tablet Oral Daily   pantoprazole  (PROTONIX ) IV  40 mg Intravenous Q12H   thiamine   100 mg Oral Daily   zinc  sulfate (50mg  elemental zinc )  220 mg Oral Daily   Continuous Infusions:  sodium chloride  75 mL/hr at 07/06/24 0203   micafungin  (MYCAMINE ) 100 mg in sodium chloride  0.9 % 100 mL IVPB 100 mg (07/05/24 1040)   piperacillin -tazobactam (ZOSYN )  IV 3.375 g (07/06/24 0505)      LOS: 4 days   Allison Finley  [facility]  07/06/2024, 6:14 AM

## 2024-07-07 DIAGNOSIS — K3189 Other diseases of stomach and duodenum: Secondary | ICD-10-CM | POA: Diagnosis not present

## 2024-07-07 LAB — H. PYLORI ANTIGEN, STOOL: H. Pylori Stool Ag, Eia: POSITIVE — AB

## 2024-07-07 LAB — PHOSPHORUS: Phosphorus: 2.1 mg/dL — ABNORMAL LOW (ref 2.5–4.6)

## 2024-07-07 LAB — HEMOGLOBIN AND HEMATOCRIT, BLOOD
HCT: 29.5 % — ABNORMAL LOW (ref 36.0–46.0)
Hemoglobin: 9.3 g/dL — ABNORMAL LOW (ref 12.0–15.0)

## 2024-07-07 LAB — POTASSIUM: Potassium: 4.4 mmol/L (ref 3.5–5.1)

## 2024-07-07 LAB — MAGNESIUM: Magnesium: 1.9 mg/dL (ref 1.7–2.4)

## 2024-07-07 MED ORDER — FERROUS SULFATE 325 (65 FE) MG PO TABS
325.0000 mg | ORAL_TABLET | Freq: Two times a day (BID) | ORAL | Status: DC
  Administered 2024-07-07 – 2024-07-18 (×22): 325 mg via ORAL
  Filled 2024-07-07 (×22): qty 1

## 2024-07-07 MED ORDER — BISMUTH SUBSALICYLATE 262 MG/15ML PO SUSP
30.0000 mL | Freq: Three times a day (TID) | ORAL | Status: DC
  Administered 2024-07-07 – 2024-07-18 (×35): 30 mL via ORAL
  Filled 2024-07-07 (×5): qty 236

## 2024-07-07 MED ORDER — FERROUS SULFATE 325 (65 FE) MG PO TBEC
325.0000 mg | DELAYED_RELEASE_TABLET | Freq: Two times a day (BID) | ORAL | Status: DC
  Filled 2024-07-07: qty 1

## 2024-07-07 MED ORDER — K PHOS MONO-SOD PHOS DI & MONO 155-852-130 MG PO TABS
500.0000 mg | ORAL_TABLET | Freq: Two times a day (BID) | ORAL | Status: AC
  Administered 2024-07-07 (×2): 500 mg via ORAL
  Filled 2024-07-07 (×2): qty 2

## 2024-07-07 MED ORDER — MELATONIN 3 MG PO TABS
3.0000 mg | ORAL_TABLET | Freq: Every day | ORAL | Status: DC
  Administered 2024-07-07 – 2024-07-17 (×11): 3 mg via ORAL
  Filled 2024-07-07 (×11): qty 1

## 2024-07-07 MED ORDER — TETRACYCLINE HCL 250 MG PO CAPS
500.0000 mg | ORAL_CAPSULE | Freq: Four times a day (QID) | ORAL | Status: DC
  Administered 2024-07-07 – 2024-07-18 (×45): 500 mg via ORAL
  Filled 2024-07-07 (×48): qty 2

## 2024-07-07 MED ORDER — FUROSEMIDE 10 MG/ML IJ SOLN
20.0000 mg | Freq: Once | INTRAMUSCULAR | Status: AC
  Administered 2024-07-07: 20 mg via INTRAVENOUS
  Filled 2024-07-07: qty 2

## 2024-07-07 MED ORDER — METRONIDAZOLE 500 MG PO TABS
500.0000 mg | ORAL_TABLET | Freq: Three times a day (TID) | ORAL | Status: DC
  Administered 2024-07-07 – 2024-07-18 (×34): 500 mg via ORAL
  Filled 2024-07-07 (×36): qty 1

## 2024-07-07 MED ORDER — PREDNISONE 20 MG PO TABS
20.0000 mg | ORAL_TABLET | Freq: Two times a day (BID) | ORAL | Status: DC
  Administered 2024-07-07 – 2024-07-18 (×23): 20 mg via ORAL
  Filled 2024-07-07 (×23): qty 1

## 2024-07-07 MED ORDER — DOXYCYCLINE HYCLATE 100 MG PO TABS
100.0000 mg | ORAL_TABLET | Freq: Two times a day (BID) | ORAL | Status: DC
  Administered 2024-07-07: 100 mg via ORAL
  Filled 2024-07-07 (×2): qty 1

## 2024-07-07 MED ORDER — VITAMIN C 500 MG PO TABS
500.0000 mg | ORAL_TABLET | Freq: Two times a day (BID) | ORAL | Status: DC
  Administered 2024-07-07 – 2024-07-18 (×22): 500 mg via ORAL
  Filled 2024-07-07 (×22): qty 1

## 2024-07-07 NOTE — Progress Notes (Signed)
 6 Days Post-Op  Subjective: Pt had an episode of blood from rectum vs vagina overnight and noted uterine prolapse. Pt reports uterine prolapse is chronic and feels that the blood came from her rectum. She is not nauseated. She is tolerating soft diet. She still is feeling very weak/deconditioned but also admits some of the hesitation with movement is anxiety after fall the other day. She also is concerned about bilateral foot swelling, denies calf pain. She is hopeful to go to inpatient rehab when medically ready.    Objective: Vital signs in last 24 hours: Temp:  [97.4 F (36.3 C)-98.4 F (36.9 C)] 97.4 F (36.3 C) (07/17 0752) Pulse Rate:  [64-106] 106 (07/17 0752) Resp:  [17-18] 17 (07/17 0415) BP: (111-124)/(71-78) 111/74 (07/17 0752) SpO2:  [95 %-96 %] 95 % (07/17 0415) Last BM Date : 07/06/24  Intake/Output from previous day: 07/16 0701 - 07/17 0700 In: 149.7 [IV Piggyback:149.7] Out: 440 [Urine:200; Drains:240] Intake/Output this shift: No intake/output data recorded.  PE: Gen:  Alert, NAD, pleasant Card:  Reg Pulm:  Rate and effort normal Abd: Soft, mild distension, appropriately tender around incision, no rigidity or guarding and otherwise NT. Incision C/D/I with staples present. JP drain serous - 240cc/24 hours. Ext:  mild edema of BL feet, no erythema or significant TTP of BL calves Psych: A&Ox3   Lab Results:  Recent Labs    07/06/24 0451 07/07/24 0556  WBC 6.4  --   HGB 9.9* 9.3*  HCT 32.0* 29.5*  PLT 270  --    BMET Recent Labs    07/06/24 0451 07/07/24 0556  NA 136  --   K 3.8 4.4  CL 100  --   CO2 26  --   GLUCOSE 167*  --   BUN 17  --   CREATININE 0.58  --   CALCIUM  8.5*  --    PT/INR No results for input(s): LABPROT, INR in the last 72 hours.  CMP     Component Value Date/Time   NA 136 07/06/2024 0451   K 4.4 07/07/2024 0556   CL 100 07/06/2024 0451   CO2 26 07/06/2024 0451   GLUCOSE 167 (H) 07/06/2024 0451   BUN 17  07/06/2024 0451   CREATININE 0.58 07/06/2024 0451   CALCIUM  8.5 (L) 07/06/2024 0451   PROT 4.3 (L) 07/02/2024 0629   ALBUMIN  1.9 (L) 07/02/2024 0629   AST 13 (L) 07/02/2024 0629   ALT 16 07/02/2024 0629   ALKPHOS 43 07/02/2024 0629   BILITOT 0.8 07/02/2024 0629   GFRNONAA >60 07/06/2024 0451   Lipase     Component Value Date/Time   LIPASE <10 (L) 08/02/2021 2201    Studies/Results: No results found.    Anti-infectives: Anti-infectives (From admission, onward)    Start     Dose/Rate Route Frequency Ordered Stop   07/07/24 1400  metroNIDAZOLE  (FLAGYL ) tablet 500 mg        500 mg Oral Every 8 hours 07/07/24 1018 07/21/24 1359   07/07/24 1115  doxycycline  (VIBRA -TABS) tablet 100 mg        100 mg Oral Every 12 hours 07/07/24 1018 07/21/24 0959   07/02/24 1000  piperacillin -tazobactam (ZOSYN ) IVPB 3.375 g        3.375 g 12.5 mL/hr over 240 Minutes Intravenous Every 8 hours 07/02/24 0440 07/07/24 0104   07/02/24 0500  micafungin  (MYCAMINE ) 100 mg in sodium chloride  0.9 % 100 mL IVPB        100 mg 105  mL/hr over 1 Hours Intravenous Daily 07/02/24 0315 07/06/24 1123   07/02/24 0145  piperacillin -tazobactam (ZOSYN ) IVPB 3.375 g        3.375 g 100 mL/hr over 30 Minutes Intravenous  Once 07/02/24 0134 07/02/24 0354   07/01/24 2237  ceFAZolin  (ANCEF ) 2-4 GM/100ML-% IVPB       Note to Pharmacy: Roddie Grate: cabinet override      07/01/24 2237 07/02/24 1044        Assessment/Plan POD5 s/p Exploratory laparotomy with [name] repair of perforated peptic ulcer by Dr. Teresa on 07/02/24 for Perforated pyloric channel peptic ulcer  - UGI POD3 negative - adv to reg diet - Cont JP. SS - Completed zosyn /micafungin  - H pylori positive, discussed with pharmacist and will treat with additional 14d doxy/flagyl  - Cont BID PPI, added bismuth  - PRN IV pain meds. Avoid NSAIDs - Mobilize, PT - recs for CIR currently  - Pulm toilet  FEN - Soft diet, SLIV VTE - SCDs, ok to resume  Lovenox   ID - Zosyn , Micafungin   Foley - removed 7/13, voiding  Hx of UC on 40 mg prednisone  per day - on solucortef currently. TRH consult to assist with stress dose steroid dosing. GI following as well. Notified GI of ?rectal bleeding Hypothyroidism - [facility] recommends if patient will be npo x >72 hours to switch to IV synthroid   UTI - 5 days zosyn  should be adequate treatment, UCx never sent  BRBPR - hold LMWH, hgb 9.3, add bid iron  and increased vit C to BID Stage 3 pressure wound to bilateral buttocks, present on admission - pressure relief and wound care   LOS: 5 days    Burnard JONELLE Louder, [facility] 07/07/2024, 10:18 AM Please see Amion for pager number during day hours 7:00am-4:30pm

## 2024-07-07 NOTE — Progress Notes (Signed)
 [facility] CONSULT PROGRESS NOTE    Progress Note  Allison Finley  FMW:[id number] DOB: 1951/10/25 DOA: 07/01/2024 PCP: Lynwood Laneta ORN, PA-C     Brief Narrative:   Allison Finley is an 73 y.o. female past medical history of ulcerative colitis, hypothyroidism iron  deficiency anemia presented with an acute perforated peptic ulcer status post [name] on 07/02/2024  Assessment/Plan:   Gastric perforation, acute Status post exploratory laparotomy with [name] on 07/02/2024. Postop care per primary team. Upper GI series without gastric leak. JP drain in place about 240 cc. Diet and antibiotics per surgery.  Currently on IV Zosyn  and micafungin  for 5 days postop Continue Protonix  twice a day. Blood cultures remain negative till date. Will discussed with surgery transition Protonix  to famotidine  to see if this helps with diarrhea. Continue analgesics per surgery. Continued incentive spirometry. Anticoagulation per orthopedic surgery. PT evaluated the patient recommended inpatient rehab She relates she continues to have diarrhea  Ulcerative colitis: CT scan of the abdomen and pelvis showed nonspecific changes. GI was consulted and planning for infliximab  in 2 weeks likely as an outpatient. Transition of steroids to oral At home she is on 10 mg daily. Appreciate GIs assistance.  Iron  deficiency anemia/acute blood loss anemia/bright red blood per rectum: Holding Lovenox . Hemoglobin this morning is pending  Sacral decubitus ulcer: Turn patient every 2 hours, continue local wound care.  Hypothyroidism: Restart home dose of Synthroid .  Vaginal prolapse: Had a little bit of bleeding but hemoglobin has remained relatively stable. Will defer to general surgery  Protein-calorie malnutrition, severe Noted     DVT prophylaxis: Lovenox  Family Communication:none Status is: Inpatient Remains inpatient appropriate because: Acute perforated  ulcer    Code Status:     Code Status Orders  (From admission, onward)           Start     Ordered   07/02/24 0058  Full code  Continuous       Question:  By:  Answer:  Default: patient does not have capacity for decision making, no surrogate or prior directive available   07/02/24 0101           Code Status History     Date Active Date Inactive Code Status Order ID Comments User Context   05/19/2024 1943 05/24/2024 2122 Full Code [id number]  MastersIzetta, DO ED      Advance Directive Documentation    Flowsheet Row Most Recent Value  Type of Advance Directive Living will, Healthcare Power of Attorney  Pre-existing out of facility DNR order (yellow form or pink MOST form) --  MOST Form in Place? --      IV Access:   Peripheral IV   Procedures and diagnostic studies:   DG UGI W SINGLE CM (SOL OR THIN BA) Result Date: 07/05/2024 CLINICAL DATA:  Patient with perforated pyloric channel peptic ulcer, s/p graham patch repair 07/02/24. evaluate for leak. EXAM: DG UGI W SINGLE CM TECHNIQUE: Scout radiograph was obtained. Patient was given small amount of thin barium by mouth but unable to drink significant amount. 80 mL of Omnipaque  300 was pushed via NG tube. Exam was significantly limited due to patient mobility, decreased oral intake ability. This exam was performed by Kimble Clas, PA-C, and was supervised and interpreted by Dr. Vanice. FLUOROSCOPY: Radiation Exposure Index (as provided by the fluoroscopic device): 19.60 mGy Kerma COMPARISON:  None Available. FINDINGS: Esophagus: Not evaluated due to limited study Esophageal motility: Not evaluated due to limited study Gastroesophageal  reflux: Not evaluated due to limited study Ingested 13 mm barium tablet: Not given Stomach: Normal appearance. No evidence of leak Gastric emptying: Normal. Duodenum: Normal appearance. Other:  None. IMPRESSION: Limited study. No evidence of any significant gastric leak or contrast  extravasation. Normal passage of contrast into the duodenum and distal small intestine. Electronically Signed   By: CHRISTELLA.  Shick M.D.   On: 07/05/2024 09:47     Medical Consultants:   None.   Subjective:    Allison Finley she continues to have watery diarrhea.  Objective:    Vitals:   07/06/24 0826 07/06/24 1925 07/06/24 2320 07/07/24 0415  BP: 115/87 124/78 122/77 121/71  Pulse: 90 86 64 88  Resp: 18 17 18 17   Temp: 97.9 F (36.6 C) 97.8 F (36.6 C) 98.4 F (36.9 C) 98.2 F (36.8 C)  TempSrc: Oral Oral Oral Oral  SpO2: 96% 96% 96% 95%  Weight:      Height:       SpO2: 95 % O2 Flow Rate (L/min): 1 L/min   Intake/Output Summary (Last 24 hours) at 07/07/2024 0653 Last data filed at 07/07/2024 0535 Gross per 24 hour  Intake 149.71 ml  Output 440 ml  Net -290.29 ml   Filed Weights   07/01/24 1751 07/04/24 1748  Weight: 59 kg 60.1 kg    Exam: General exam: In no acute distress. Respiratory system: Good air movement and clear to auscultation. Cardiovascular system: S1 & S2 heard, RRR. No JVD. Gastrointestinal system: Abdomen is nondistended, soft and drain in place Extremities: No pedal edema. Skin: No rashes, lesions or ulcers Psychiatry: Judgement and insight appear normal. Mood & affect appropriate.  Data Reviewed:    Labs: Basic Metabolic Panel: Recent Labs  Lab 07/01/24 1821 07/02/24 0629 07/03/24 0658 07/04/24 0556 07/06/24 0451  NA 126* 133* 133* 134* 136  K 4.0 3.1* 4.0 4.0 3.8  CL 94* 101 101 102 100  CO2 26 23 23  20* 26  GLUCOSE 200* 139* 73 55* 167*  BUN 23 16 17 18 17   CREATININE 0.51 0.37* 0.52 0.58 0.58  CALCIUM  8.3* 7.7* 8.7* 8.4* 8.5*  MG  --   --  2.0 2.0 2.1  PHOS  --   --  3.0 3.4 2.4*   GFR Estimated Creatinine Clearance: 49.5 mL/min (by C-G formula based on SCr of 0.58 mg/dL). Liver Function Tests: Recent Labs  Lab 07/01/24 1821 07/02/24 0629  AST 18 13*  ALT 22 16  ALKPHOS 66 43  BILITOT 0.4 0.8   PROT 5.4* 4.3*  ALBUMIN  1.9* 1.9*   No results for input(s): LIPASE, AMYLASE in the last 168 hours. No results for input(s): AMMONIA in the last 168 hours. Coagulation profile Recent Labs  Lab 07/01/24 1821  INR 1.2   COVID-19 Labs  No results for input(s): DDIMER, FERRITIN, LDH, CRP in the last 72 hours.  No results found for: SARSCOV2NAA  CBC: Recent Labs  Lab 07/01/24 1821 07/02/24 0629 07/03/24 0658 07/04/24 0556 07/06/24 0451  WBC 11.9* 10.0 13.6* 9.5 6.4  NEUTROABS 9.4*  --   --   --   --   HGB 8.4* 8.3* 9.8* 10.3* 9.9*  HCT 27.4* 26.6* 31.8* 34.0* 32.0*  MCV 89.8 88.4 89.6 91.6 88.4  PLT 281 164 206 206 270   Cardiac Enzymes: No results for input(s): CKTOTAL, CKMB, CKMBINDEX, TROPONINI in the last 168 hours. BNP (last 3 results) No results for input(s): PROBNP in the last 8760 hours. CBG: No results  for input(s): GLUCAP in the last 168 hours. D-Dimer: No results for input(s): DDIMER in the last 72 hours. Hgb A1c: No results for input(s): HGBA1C in the last 72 hours. Lipid Profile: No results for input(s): CHOL, HDL, LDLCALC, TRIG, CHOLHDL, LDLDIRECT in the last 72 hours. Thyroid function studies: No results for input(s): TSH, T4TOTAL, T3FREE, THYROIDAB in the last 72 hours.  Invalid input(s): FREET3 Anemia work up: No results for input(s): VITAMINB12, FOLATE, FERRITIN, TIBC, IRON , RETICCTPCT in the last 72 hours. Sepsis Labs: Recent Labs  Lab 07/01/24 1841 07/01/24 2009 07/02/24 0629 07/03/24 0658 07/04/24 0556 07/06/24 0451  WBC  --   --  10.0 13.6* 9.5 6.4  LATICACIDVEN 2.4* 2.6*  --   --   --   --    Microbiology Recent Results (from the past 240 hours)  Culture, blood (Routine x 2)     Status: None   Collection Time: 07/01/24  5:50 PM   Specimen: BLOOD LEFT FOREARM  Result Value Ref Range Status   Specimen Description BLOOD LEFT FOREARM  Final   Special Requests   Final     BOTTLES DRAWN AEROBIC AND ANAEROBIC Blood Culture adequate volume   Culture   Final    NO GROWTH 5 DAYS Performed at [facility], [address] [place], [place] [address]    Report Status 07/06/2024 FINAL  Final  Culture, blood (Routine x 2)     Status: None   Collection Time: 07/01/24  5:55 PM   Specimen: BLOOD  Result Value Ref Range Status   Specimen Description BLOOD RIGHT ANTECUBITAL  Final   Special Requests   Final    BOTTLES DRAWN AEROBIC AND ANAEROBIC Blood Culture adequate volume   Culture   Final    NO GROWTH 5 DAYS Performed at [facility], [address] [place], [place] [address]    Report Status 07/06/2024 FINAL  Final     Medications:    sodium chloride    Intravenous Once   acetaminophen   1,000 mg Oral Q6H   ascorbic acid   500 mg Oral Daily   enoxaparin  (LOVENOX ) injection  40 mg Subcutaneous Daily   feeding supplement (KATE FARMS STANDARD 1.4)  325 mL Oral TID BM   levothyroxine   112 mcg Oral Daily   methylPREDNISolone  (SOLU-MEDROL ) injection  40 mg Intravenous Daily   multivitamin with minerals  1 tablet Oral Daily   pantoprazole  (PROTONIX ) IV  40 mg Intravenous Q12H   thiamine   100 mg Oral Daily   zinc  sulfate (50mg  elemental zinc )  220 mg Oral Daily   Continuous Infusions:      LOS: 5 days   Allison Finley  [facility]  07/07/2024, 6:53 AM

## 2024-07-07 NOTE — Progress Notes (Signed)
 Subjective: Two bloody bowel movements last evening.  Objective: Vital signs in last 24 hours: Temp:  [97.4 F (36.3 C)-98.4 F (36.9 C)] 98 F (36.7 C) (07/17 1136) Pulse Rate:  [64-106] 84 (07/17 1136) Resp:  [17-18] 18 (07/17 1136) BP: (111-127)/(71-80) 127/80 (07/17 1136) SpO2:  [95 %-96 %] 95 % (07/17 1136) Last BM Date : 07/07/24  Intake/Output from previous day: 07/16 0701 - 07/17 0700 In: 149.7 [IV Piggyback:149.7] Out: 440 [Urine:200; Drains:240] Intake/Output this shift: Total I/O In: -  Out: 440 [Urine:350; Drains:90]  General appearance: alert and no distress GI: tender at the incision site  Lab Results: Recent Labs    07/06/24 0451 07/07/24 0556  WBC 6.4  --   HGB 9.9* 9.3*  HCT 32.0* 29.5*  PLT 270  --    BMET Recent Labs    07/06/24 0451 07/07/24 0556  NA 136  --   K 3.8 4.4  CL 100  --   CO2 26  --   GLUCOSE 167*  --   BUN 17  --   CREATININE 0.58  --   CALCIUM  8.5*  --    LFT No results for input(s): PROT, ALBUMIN , AST, ALT, ALKPHOS, BILITOT, BILIDIR, IBILI in the last 72 hours. PT/INR No results for input(s): LABPROT, INR in the last 72 hours. Hepatitis Panel No results for input(s): HEPBSAG, HCVAB, HEPAIGM, HEPBIGM in the last 72 hours. C-Diff No results for input(s): CDIFFTOX in the last 72 hours. Fecal Lactopherrin No results for input(s): FECLLACTOFRN in the last 72 hours.  Studies/Results: No results found.  Medications: Scheduled:  sodium chloride    Intravenous Once   acetaminophen   1,000 mg Oral Q6H   ascorbic acid   500 mg Oral BID   bismuth  subsalicylate  30 mL Oral TID AC & HS   feeding supplement (KATE FARMS STANDARD 1.4)  325 mL Oral TID BM   ferrous sulfate   325 mg Oral BID WC   levothyroxine   112 mcg Oral Daily   melatonin  3 mg Oral QHS   metroNIDAZOLE   500 mg Oral Q8H   multivitamin with minerals  1 tablet Oral Daily   pantoprazole  (PROTONIX ) IV  40 mg Intravenous Q12H    predniSONE   20 mg Oral BID WC   tetracycline   500 mg Oral QID   thiamine   100 mg Oral Daily   zinc  sulfate (50mg  elemental zinc )  220 mg Oral Daily   Continuous:  Assessment/Plan: 1) H. Pylori +. 2) Pan UC. 3) Perforated pyloric channel ulcer s/p repair.   The patient had two bloody bowel movements last evening.  Her HGB is stable.  She is tolerating PO without any difficulty.  The presumption is that she is having bleeding from her UC.  As a result of the bleeding her Lovenox  was held and she currently has SCDs.  From the UC standpoint treatment options are limited. As an outpatient she was maintained on steroids and 5-ASA was added onto her therapy, but she was intolerance.  She felt that the medication worsened her symptoms.  She does not want to give it another try here in the hospital.  It is unlikely that she will be a rehab candidate with her intermittent hematochezia, although this may improve off of Lovenox , but there is a higher risk of DVT with her inflammatory state as an inpatient.  Plan: 1) Agree with quadruple therapy. 2) Agree with transitioning to oral steroids.  Prednisone  40 mg is acceptable. 3) SCDs. 4) Monitor HGB and  transfuse as necessary.  LOS: 5 days   Allison Finley D 07/07/2024, 1:45 PM

## 2024-07-07 NOTE — TOC Initial Note (Signed)
 Transition of Care (TOC) - Initial/Assessment Note  Rayfield Gobble RN,BSN Inpatient Care Management Unit 4NP (Non Trauma)- RN Case Manager See Treatment Team for direct Phone #   Patient Details  Name: Allison Finley MRN: [id number] Date of Birth: [birth year]  Transition of Care [facility]) CM/SW Contact:    Gobble Rayfield Shona, RN Phone Number: 07/07/2024, 11:33 AM  Clinical Narrative:                 Pt from home, admitted with gastric perforation, s/p repair. CIR following for potential admit pending pt's progress with therapies. Will need insurance auth prior to admit to CIR.   CM to continue to follow  Expected Discharge Plan: IP Rehab Facility Barriers to Discharge: Continued Medical Work up   Patient Goals and CMS Choice Patient states their goals for this hospitalization and ongoing recovery are:: wants to get better   Choice offered to / list presented to : Patient      Expected Discharge Plan and Services   Discharge Planning Services: CM Consult Post Acute Care Choice: IP Rehab Living arrangements for the past 2 months: Single Family Home                                      Prior Living Arrangements/Services Living arrangements for the past 2 months: Single Family Home Lives with:: Self Patient language and need for interpreter reviewed:: Yes Do you feel safe going back to the place where you live?: Yes      Need for Family Participation in Patient Care: Yes (Comment) Care giver support system in place?: Yes (comment)   Criminal Activity/Legal Involvement Pertinent to Current Situation/Hospitalization: No - Comment as needed  Activities of Daily Living      Permission Sought/Granted Permission sought to share information with : Facility Industrial/product designer granted to share information with : Yes, Verbal Permission Granted              Emotional Assessment Appearance:: Appears stated age     Orientation: : Oriented  to Self, Oriented to Place, Oriented to  Time, Oriented to Situation Alcohol / Substance Use: Not Applicable Psych Involvement: No (comment)  Admission diagnosis:  Gastric perforation, acute [K31.89] Patient Active Problem List   Diagnosis Date Noted   Protein-calorie malnutrition, severe 07/05/2024   Gastric perforation, acute 07/02/2024   Iron  deficiency anemia due to chronic blood loss 05/22/2024   Cystocele, unspecified 05/22/2024   Inflammatory bowel disease (ulcerative colitis) (HCC) 05/20/2024   Hematochezia 05/19/2024   PCP:  Lynwood Laneta ORN, PA-C Pharmacy:   [facility] [address], [place] - [address] [place] [address] Phone: [phone] Fax: [phone]  [name] [address] [place] [address] Phone: [phone] Fax: [phone]  [id number] - [place], [place] - [address] [place] [address] Phone: [phone] Fax: [phone]     Social Drivers of Health (SDOH) Social History: SDOH Screenings   Food Insecurity: Unknown (07/02/2024)  Housing: Low Risk  (07/02/2024)  Transportation Needs: No Transportation Needs (07/02/2024)  Utilities: Not At Risk (07/02/2024)  Social Connections: Moderately Isolated (07/02/2024)  Tobacco Use: Low Risk  (07/01/2024)  Recent Concern: Tobacco Use - Medium Risk (06/06/2024)   Received from [facility]   SDOH Interventions:     Readmission  Risk Interventions     No data to display

## 2024-07-07 NOTE — Progress Notes (Signed)
 Patient utilized call bell to let staff know she think she had a bowel movement. When this RN went in to do peri care, RN noticed prolapsed vagina and blood w/ clots. Patient states, that has been that way for a year or so. RN performed peri care, obtained picture for chart, and notified Andrez, NP with [facility].

## 2024-07-08 DIAGNOSIS — K3189 Other diseases of stomach and duodenum: Secondary | ICD-10-CM | POA: Diagnosis not present

## 2024-07-08 LAB — CBC
HCT: 30.1 % — ABNORMAL LOW (ref 36.0–46.0)
Hemoglobin: 9.4 g/dL — ABNORMAL LOW (ref 12.0–15.0)
MCH: 27.2 pg (ref 26.0–34.0)
MCHC: 31.2 g/dL (ref 30.0–36.0)
MCV: 87 fL (ref 80.0–100.0)
Platelets: 236 K/uL (ref 150–400)
RBC: 3.46 MIL/uL — ABNORMAL LOW (ref 3.87–5.11)
RDW: 17.2 % — ABNORMAL HIGH (ref 11.5–15.5)
WBC: 7.5 K/uL (ref 4.0–10.5)
nRBC: 0 % (ref 0.0–0.2)

## 2024-07-08 LAB — PHOSPHORUS: Phosphorus: 2.1 mg/dL — ABNORMAL LOW (ref 2.5–4.6)

## 2024-07-08 LAB — POTASSIUM: Potassium: 4.4 mmol/L (ref 3.5–5.1)

## 2024-07-08 LAB — MAGNESIUM: Magnesium: 1.8 mg/dL (ref 1.7–2.4)

## 2024-07-08 MED ORDER — K PHOS MONO-SOD PHOS DI & MONO 155-852-130 MG PO TABS
500.0000 mg | ORAL_TABLET | Freq: Two times a day (BID) | ORAL | Status: AC
  Administered 2024-07-08 (×2): 500 mg via ORAL
  Filled 2024-07-08 (×2): qty 2

## 2024-07-08 NOTE — Progress Notes (Signed)
 [facility] CONSULT PROGRESS NOTE    Progress Note  Allison Finley  FMW:[id number] DOB: [birth year] DOA: 07/01/2024 PCP: Lynwood Laneta ORN, PA-C     Brief Narrative:   Allison Finley is an 73 y.o. female past medical history of ulcerative colitis, hypothyroidism iron  deficiency anemia presented with an acute perforated peptic ulcer status post [name] on 07/02/2024  Assessment/Plan:   Gastric perforation, acute Status post exploratory laparotomy with [name] on 07/02/2024. Postop care per primary team. Upper GI series without gastric leak. JP drain in place about 240 cc. Diet and antibiotics per surgery.   Currently on Doxy and Flagyl . PPI twice a day and bismuth  Blood cultures remain negative till date. Continue analgesics per surgery. Continued incentive spirometry. PT evaluated the patient recommended inpatient rehab Diarrhea has subsided.  No further hematochezia.  Will continue to follow at a distance.  H. pylori positive: Continue Doxy, Flagyl  bismuth  and PPI.  Ulcerative colitis: CT scan of the abdomen and pelvis showed nonspecific changes. GI was consulted and planning for infliximab  in 2 weeks likely as an outpatient. Continue prednisone  40 At home she is on 10 mg daily. Appreciate GIs assistance.  Iron  deficiency anemia/acute blood loss anemia/bright red blood per rectum: Holding Lovenox . Hemoglobin has remained stable no further episodes of bleeding.  Hypophosphatemia: Replete orally recheck in the morning.  Sacral decubitus ulcer: Turn patient every 2 hours, continue local wound care.  Hypothyroidism: Continue oral Synthroid .  Vaginal prolapse: Had a little bit of bleeding but hemoglobin has remained relatively stable. Will defer to general surgery  Protein-calorie malnutrition, severe Noted  Stage I sacral decubitus ulcer: Consult wound care.   DVT prophylaxis: Lovenox  Family Communication:none Status is:  Inpatient Remains inpatient appropriate because: Acute perforated ulcer    Code Status:     Code Status Orders  (From admission, onward)           Start     Ordered   07/02/24 0058  Full code  Continuous       Question:  By:  Answer:  Default: patient does not have capacity for decision making, no surrogate or prior directive available   07/02/24 0101           Code Status History     Date Active Date Inactive Code Status Order ID Comments User Context   05/19/2024 1943 05/24/2024 2122 Full Code [id number]  MastersIzetta, DO ED      Advance Directive Documentation    Flowsheet Row Most Recent Value  Type of Advance Directive Living will, Healthcare Power of Attorney  Pre-existing out of facility DNR order (yellow form or pink MOST form) --  MOST Form in Place? --      IV Access:   Peripheral IV   Procedures and diagnostic studies:   No results found.    Medical Consultants:   None.   Subjective:    Allison Finley improved no further hematochezia  Objective:    Vitals:   07/07/24 1626 07/07/24 1958 07/07/24 2306 07/08/24 0723  BP: 122/85  105/68 118/74  Pulse:  96 87 91  Resp:  16 16 13   Temp: 98.2 F (36.8 C) (!) 97.5 F (36.4 C) 97.6 F (36.4 C) 97.8 F (36.6 C)  TempSrc:  Oral Oral Oral  SpO2: 97% 94% 95% 94%  Weight:      Height:       SpO2: 94 % O2 Flow Rate (L/min): 1 L/min   Intake/Output Summary (Last 24  hours) at 07/08/2024 0724 Last data filed at 07/08/2024 0537 Gross per 24 hour  Intake --  Output 1520 ml  Net -1520 ml   Filed Weights   07/01/24 1751 07/04/24 1748  Weight: 59 kg 60.1 kg    Exam: General exam: In no acute distress. Respiratory system: Good air movement and clear to auscultation. Cardiovascular system: S1 & S2 heard, RRR. No JVD. Gastrointestinal system: Abdomen is nondistended, soft and nontender.  Extremities: No pedal edema. Skin: No rashes, lesions or ulcers Psychiatry:  Judgement and insight appear normal. Mood & affect appropriate.  Data Reviewed:    Labs: Basic Metabolic Panel: Recent Labs  Lab 07/01/24 1821 07/02/24 0629 07/03/24 9341 07/04/24 0556 07/06/24 0451 07/07/24 0556 07/08/24 0442  NA 126* 133* 133* 134* 136  --   --   K 4.0 3.1* 4.0 4.0 3.8 4.4 4.4  CL 94* 101 101 102 100  --   --   CO2 26 23 23  20* 26  --   --   GLUCOSE 200* 139* 73 55* 167*  --   --   BUN 23 16 17 18 17   --   --   CREATININE 0.51 0.37* 0.52 0.58 0.58  --   --   CALCIUM  8.3* 7.7* 8.7* 8.4* 8.5*  --   --   MG  --   --  2.0 2.0 2.1 1.9 1.8  PHOS  --   --  3.0 3.4 2.4* 2.1* 2.1*   GFR Estimated Creatinine Clearance: 49.5 mL/min (by C-G formula based on SCr of 0.58 mg/dL). Liver Function Tests: Recent Labs  Lab 07/01/24 1821 07/02/24 0629  AST 18 13*  ALT 22 16  ALKPHOS 66 43  BILITOT 0.4 0.8  PROT 5.4* 4.3*  ALBUMIN  1.9* 1.9*   No results for input(s): LIPASE, AMYLASE in the last 168 hours. No results for input(s): AMMONIA in the last 168 hours. Coagulation profile Recent Labs  Lab 07/01/24 1821  INR 1.2   COVID-19 Labs  No results for input(s): DDIMER, FERRITIN, LDH, CRP in the last 72 hours.  No results found for: SARSCOV2NAA  CBC: Recent Labs  Lab 07/01/24 1821 07/02/24 0629 07/03/24 0658 07/04/24 0556 07/06/24 0451 07/07/24 0556  WBC 11.9* 10.0 13.6* 9.5 6.4  --   NEUTROABS 9.4*  --   --   --   --   --   HGB 8.4* 8.3* 9.8* 10.3* 9.9* 9.3*  HCT 27.4* 26.6* 31.8* 34.0* 32.0* 29.5*  MCV 89.8 88.4 89.6 91.6 88.4  --   PLT 281 164 206 206 270  --    Cardiac Enzymes: No results for input(s): CKTOTAL, CKMB, CKMBINDEX, TROPONINI in the last 168 hours. BNP (last 3 results) No results for input(s): PROBNP in the last 8760 hours. CBG: No results for input(s): GLUCAP in the last 168 hours. D-Dimer: No results for input(s): DDIMER in the last 72 hours. Hgb A1c: No results for input(s): HGBA1C in the  last 72 hours. Lipid Profile: No results for input(s): CHOL, HDL, LDLCALC, TRIG, CHOLHDL, LDLDIRECT in the last 72 hours. Thyroid function studies: No results for input(s): TSH, T4TOTAL, T3FREE, THYROIDAB in the last 72 hours.  Invalid input(s): FREET3 Anemia work up: No results for input(s): VITAMINB12, FOLATE, FERRITIN, TIBC, IRON , RETICCTPCT in the last 72 hours. Sepsis Labs: Recent Labs  Lab 07/01/24 1841 07/01/24 2009 07/02/24 0629 07/03/24 0658 07/04/24 0556 07/06/24 0451  WBC  --   --  10.0 13.6* 9.5 6.4  LATICACIDVEN 2.4* 2.6*  --   --   --   --  Microbiology Recent Results (from the past 240 hours)  Culture, blood (Routine x 2)     Status: None   Collection Time: 07/01/24  5:50 PM   Specimen: BLOOD LEFT FOREARM  Result Value Ref Range Status   Specimen Description BLOOD LEFT FOREARM  Final   Special Requests   Final    BOTTLES DRAWN AEROBIC AND ANAEROBIC Blood Culture adequate volume   Culture   Final    NO GROWTH 5 DAYS Performed at [facility], [address], [place], [place] [address]    Report Status 07/06/2024 FINAL  Final  Culture, blood (Routine x 2)     Status: None   Collection Time: 07/01/24  5:55 PM   Specimen: BLOOD  Result Value Ref Range Status   Specimen Description BLOOD RIGHT ANTECUBITAL  Final   Special Requests   Final    BOTTLES DRAWN AEROBIC AND ANAEROBIC Blood Culture adequate volume   Culture   Final    NO GROWTH 5 DAYS Performed at [facility], [address], [place], [place] [address]    Report Status 07/06/2024 FINAL  Final     Medications:    sodium chloride    Intravenous Once   acetaminophen   1,000 mg Oral Q6H   ascorbic acid   500 mg Oral BID   bismuth  subsalicylate  30 mL Oral TID AC & HS   feeding supplement (KATE FARMS STANDARD 1.4)  325 mL Oral TID BM   ferrous sulfate   325 mg Oral BID WC   levothyroxine   112 mcg Oral Daily   melatonin  3 mg Oral QHS   metroNIDAZOLE    500 mg Oral Q8H   multivitamin with minerals  1 tablet Oral Daily   pantoprazole  (PROTONIX ) IV  40 mg Intravenous Q12H   predniSONE   20 mg Oral BID WC   tetracycline   500 mg Oral QID   thiamine   100 mg Oral Daily   zinc  sulfate (50mg  elemental zinc )  220 mg Oral Daily   Continuous Infusions:      LOS: 6 days   Erle Odell Castor  [facility]  07/08/2024, 7:24 AM

## 2024-07-08 NOTE — Progress Notes (Signed)
 Subjective: Feeling well.  No complaints of hematochezia.  She had some loose stools yesterday, but she feels as if her stools are less loose.  Objective: Vital signs in last 24 hours: Temp:  [97.4 F (36.3 C)-98.2 F (36.8 C)] 97.8 F (36.6 C) (07/18 0723) Pulse Rate:  [84-106] 91 (07/18 0723) Resp:  [13-18] 13 (07/18 0723) BP: (105-127)/(68-85) 118/74 (07/18 0723) SpO2:  [94 %-97 %] 94 % (07/18 0723) Last BM Date : 07/07/24  Intake/Output from previous day: 07/17 0701 - 07/18 0700 In: -  Out: 1520 [Urine:1400; Drains:120] Intake/Output this shift: No intake/output data recorded.  General appearance: alert and no distress GI: tender at the incision site  Lab Results: Recent Labs    07/06/24 0451 07/07/24 0556  WBC 6.4  --   HGB 9.9* 9.3*  HCT 32.0* 29.5*  PLT 270  --    BMET Recent Labs    07/06/24 0451 07/07/24 0556 07/08/24 0442  NA 136  --   --   K 3.8 4.4 4.4  CL 100  --   --   CO2 26  --   --   GLUCOSE 167*  --   --   BUN 17  --   --   CREATININE 0.58  --   --   CALCIUM  8.5*  --   --    LFT No results for input(s): PROT, ALBUMIN , AST, ALT, ALKPHOS, BILITOT, BILIDIR, IBILI in the last 72 hours. PT/INR No results for input(s): LABPROT, INR in the last 72 hours. Hepatitis Panel No results for input(s): HEPBSAG, HCVAB, HEPAIGM, HEPBIGM in the last 72 hours. C-Diff No results for input(s): CDIFFTOX in the last 72 hours. Fecal Lactopherrin No results for input(s): FECLLACTOFRN in the last 72 hours.  Studies/Results: No results found.  Medications: Scheduled:  sodium chloride    Intravenous Once   acetaminophen   1,000 mg Oral Q6H   ascorbic acid   500 mg Oral BID   bismuth  subsalicylate  30 mL Oral TID AC & HS   feeding supplement (KATE FARMS STANDARD 1.4)  325 mL Oral TID BM   ferrous sulfate   325 mg Oral BID WC   levothyroxine   112 mcg Oral Daily   melatonin  3 mg Oral QHS   metroNIDAZOLE   500 mg Oral Q8H    multivitamin with minerals  1 tablet Oral Daily   pantoprazole  (PROTONIX ) IV  40 mg Intravenous Q12H   phosphorus  500 mg Oral BID   predniSONE   20 mg Oral BID WC   tetracycline   500 mg Oral QID   thiamine   100 mg Oral Daily   zinc  sulfate (50mg  elemental zinc )  220 mg Oral Daily   Continuous:  Assessment/Plan: 1) Pan UC. 2) H. Pylori ulceration with perforation. 3) Pyloric channel perforation s/p repair. 4) Anemia - stable. 5) Deconditioning.   Clinically she is stable.  There are some signs of improvement with her lack of bleeding and less diarrhea.  She is tolerating PO without difficulty.  The patient was not using her SCDs overnight.  She was instructed to maintain the SCDs as she is at high risk for DVT with the pan UC.  Plan: 1) Continue with quadruple therapy. 2) Monitor HGB and transfuse if necessary. 3) Maintain prednisone  40 mg every day. 4) Physical therapy.  LOS: 6 days   Saphyra Hutt D 07/08/2024, 7:38 AM

## 2024-07-08 NOTE — Consult Note (Addendum)
 WOC Nurse Re-consult Note: Reason for Consult: Refer to previous WOC consult performed 7/13.   Requested to re-assess buttocks wounds. Consult performed remotely after review of progress notes and photos in the EMR.  Pt is having GI bleeding and this is occurring in close proximity to the wounds and it is difficult to keep them from soiling and promote healing.  Bilat buttocks with red moist Stage 3 pressure injuries Pressure Injury POA: Yes Dressing procedure/placement/frequency: Since the wounds are remaining moist, I will discontinue the hydrogel and begin Aquacel to absorb drainage and provide antimicrobial benefits.  Topical treatment orders provided for bedside nurses to perform as follows: Cleanse bilateral buttock wounds with saline, pat dry Apply a piece of Aquacel Soila # [id number]) over each wound, then cover with foam dressing.  Change foam Q 3 days or PRN soiling. Please re-consult if further assistance is needed.  Thank-you,  Stephane Fought MSN, RN, CWOCN, CWCN-AP, CNS Contact Mon-Fri 0700-1500: [phone]

## 2024-07-08 NOTE — Progress Notes (Signed)
 Inpatient Rehabilitation Admissions Coordinator   I met with patient and her daughter at bedside. I await medical stability over the weekend and if remains well, I would begin Auth with [facility] on  Monday for  possible Cir admit next week. We can Monitor serial CBCs and transfuse if needed. They are in agreement to plan.  Heron Leavell, RN, MSN Rehab Admissions Coordinator [phone] 07/08/2024 10:55 AM

## 2024-07-08 NOTE — Progress Notes (Signed)
 Physical Therapy Treatment Patient Details Name: Allison Finley MRN: [id number] DOB: Mar 17, 1951 Today's Date: 07/08/2024   History of Present Illness Pt is a 73 y.o. female who presented 07/01/24 with severe abdominal pain. Pt found to have perforated pyloric channel peptic ulcer, s/p ex lap with repair 7/12. PMH: ulcerative colitis    PT Comments  Pt initially anxious about mobility, benefits from encouragement and close chair follow throughout session to build confidence. Pt progressing well today, walking short distance in room with RW and min steadying assist. Pt struggles most with transfers into standing, suspect due to proximal LE weakness. Plan remains appropriate, PT to continue to follow.    pt reporting swimmyheaded with standing, SBP 97 with initial stand and recovered to 104 after gait    If plan is discharge home, recommend the following: A lot of help with walking and/or transfers;A lot of help with bathing/dressing/bathroom;Assistance with cooking/housework;Assist for transportation;Help with stairs or ramp for entrance   Can travel by private vehicle        Equipment Recommendations  None recommended by PT    Recommendations for Other Services       Precautions / Restrictions Precautions Precautions: Fall Precaution/Restrictions Comments: abdominal precautions; R JP drain Restrictions Weight Bearing Restrictions Per Provider Order: No     Mobility  Bed Mobility Overal bed mobility: Needs Assistance Bed Mobility: Rolling, Sidelying to Sit Rolling: Min assist Sidelying to sit: Mod assist       General bed mobility comments: assist for log roll towards R and trunk elevation to upright    Transfers Overall transfer level: Needs assistance Equipment used: Rolling walker (2 wheels) Transfers: Sit to/from Stand Sit to Stand: Mod assist, +2 physical assistance, From elevated surface           General transfer comment: mod +2 for power up, rise,  steady. On first stand attempt pt unable to power up to upright requests sit back down. on second attempt, PT and SPT used bed pad to boost pt buttocks up and pt able to achieve full rise to upright.    Ambulation/Gait Ambulation/Gait assistance: Min assist, +2 safety/equipment Gait Distance (Feet): 35 Feet Assistive device: Rolling walker (2 wheels) Gait Pattern/deviations: Step-through pattern, Decreased stride length, Narrow base of support, Trunk flexed, Shuffle Gait velocity: decr     General Gait Details: assist to steady, close chair follow for pt comfort. cues for upright posture, placement in RW, and increasing foot clearance and heel-toe gait   Stairs             Wheelchair Mobility     Tilt Bed    Modified Rankin (Stroke Patients Only)       Balance Overall balance assessment: Needs assistance Sitting-balance support: No upper extremity supported, Feet supported Sitting balance-Leahy Scale: Good     Standing balance support: Reliant on assistive device for balance, Bilateral upper extremity supported, During functional activity Standing balance-Leahy Scale: Poor Standing balance comment: reliant on RW                            Communication Communication Communication: No apparent difficulties  Cognition Arousal: Alert Behavior During Therapy: Anxious   PT - Cognitive impairments: Initiation, Awareness, Problem solving, Sequencing                       PT - Cognition Comments: anxiety with mobility, benefits from encouragement and close chair follow when OOB  to build confidence Following commands: Impaired Following commands impaired: Follows multi-step commands with increased time    Cueing Cueing Techniques: Verbal cues, Tactile cues  Exercises General Exercises - Lower Extremity Long Arc Quad: AROM, Both, Seated, 15 reps Heel Raises: AROM, Both, 10 reps, Seated    General Comments General comments (skin integrity,  edema, etc.): pt reporting swimmyheaded with standing, SBP 97 with initial stand and recovered to 104 after gait      Pertinent Vitals/Pain Pain Assessment Pain Assessment: Faces Faces Pain Scale: Hurts even more Pain Location: abdomen, buttocks from frequent BMs/wiping Pain Descriptors / Indicators: Grimacing, Operative site guarding, Sore Pain Intervention(s): Monitored during session, Limited activity within patient's tolerance, Repositioned    Home Living                          Prior Function            PT Goals (current goals can now be found in the care plan section) Acute Rehab PT Goals Patient Stated Goal: to return to being independent PT Goal Formulation: With patient Time For Goal Achievement: 07/16/24 Potential to Achieve Goals: Good Progress towards PT goals: Progressing toward goals    Frequency    Min 2X/week      PT Plan      Co-evaluation              AM-PAC PT 6 Clicks Mobility   Outcome Measure  Help needed turning from your back to your side while in a flat bed without using bedrails?: A Little Help needed moving from lying on your back to sitting on the side of a flat bed without using bedrails?: A Lot Help needed moving to and from a bed to a chair (including a wheelchair)?: A Lot Help needed standing up from a chair using your arms (e.g., wheelchair or bedside chair)?: A Lot Help needed to walk in hospital room?: A Lot Help needed climbing 3-5 steps with a railing? : Total 6 Click Score: 12    End of Session Equipment Utilized During Treatment: Gait belt Activity Tolerance: Patient tolerated treatment well Patient left: in chair;with call bell/phone within reach;Other (comment);with family/visitor present (daughter at bedside, pt states she will wait for staff assist prior to mobilizing back to bed) Nurse Communication: Mobility status PT Visit Diagnosis: Other abnormalities of gait and mobility (R26.89);Muscle  weakness (generalized) (M62.81);Pain     Time: 0811-0839 PT Time Calculation (min) (ACUTE ONLY): 28 min  Charges:    $Gait Training: 8-22 mins $Therapeutic Activity: 8-22 mins PT General Charges $$ ACUTE PT VISIT: 1 Visit                     Allison Finley, PT DPT Acute Rehabilitation Services Secure Chat Preferred  Office [phone]    Allison Finley 07/08/2024, 9:07 AM

## 2024-07-08 NOTE — Progress Notes (Addendum)
 7 Days Post-Op  Subjective: States diarrhea has slowed down and is less bloody. Mild LLQ pain present. Denies nausea/vomiting. Daughter at bedside.  Objective: Vital signs in last 24 hours: Temp:  [97.5 F (36.4 C)-98.2 F (36.8 C)] 97.8 F (36.6 C) (07/18 0723) Pulse Rate:  [84-96] 91 (07/18 0723) Resp:  [13-18] 13 (07/18 0723) BP: (105-127)/(68-85) 118/74 (07/18 0723) SpO2:  [94 %-97 %] 94 % (07/18 0723) Last BM Date : 07/07/24  Intake/Output from previous day: 07/17 0701 - 07/18 0700 In: -  Out: 1520 [Urine:1400; Drains:120] Intake/Output this shift: No intake/output data recorded.  PE: Gen:  Alert, NAD, pleasant Card:  Reg Pulm:  Rate and effort normal Abd: Soft, mild distension, appropriately tender around incision, no rigidity or guarding and otherwise NT. Incision C/D/I with staples present. JP drain SS - 140cc/24 hours. Ext:  mild edema of BL feet, no erythema or significant TTP of BL calves Psych: A&Ox3   Lab Results:  Recent Labs    07/06/24 0451 07/07/24 0556  WBC 6.4  --   HGB 9.9* 9.3*  HCT 32.0* 29.5*  PLT 270  --    BMET Recent Labs    07/06/24 0451 07/07/24 0556 07/08/24 0442  NA 136  --   --   K 3.8 4.4 4.4  CL 100  --   --   CO2 26  --   --   GLUCOSE 167*  --   --   BUN 17  --   --   CREATININE 0.58  --   --   CALCIUM  8.5*  --   --    PT/INR No results for input(s): LABPROT, INR in the last 72 hours.  CMP     Component Value Date/Time   NA 136 07/06/2024 0451   K 4.4 07/08/2024 0442   CL 100 07/06/2024 0451   CO2 26 07/06/2024 0451   GLUCOSE 167 (H) 07/06/2024 0451   BUN 17 07/06/2024 0451   CREATININE 0.58 07/06/2024 0451   CALCIUM  8.5 (L) 07/06/2024 0451   PROT 4.3 (L) 07/02/2024 0629   ALBUMIN  1.9 (L) 07/02/2024 0629   AST 13 (L) 07/02/2024 0629   ALT 16 07/02/2024 0629   ALKPHOS 43 07/02/2024 0629   BILITOT 0.8 07/02/2024 0629   GFRNONAA >60 07/06/2024 0451   Lipase     Component Value Date/Time   LIPASE  <10 (L) 08/02/2021 2201    Studies/Results: No results found.    Anti-infectives: Anti-infectives (From admission, onward)    Start     Dose/Rate Route Frequency Ordered Stop   07/07/24 1430  tetracycline  (SUMYCIN ) capsule 500 mg        500 mg Oral 4 times daily 07/07/24 1344     07/07/24 1400  metroNIDAZOLE  (FLAGYL ) tablet 500 mg        500 mg Oral Every 8 hours 07/07/24 1018 07/21/24 1359   07/07/24 1115  doxycycline  (VIBRA -TABS) tablet 100 mg  Status:  Discontinued        100 mg Oral Every 12 hours 07/07/24 1018 07/07/24 1344   07/02/24 1000  piperacillin -tazobactam (ZOSYN ) IVPB 3.375 g        3.375 g 12.5 mL/hr over 240 Minutes Intravenous Every 8 hours 07/02/24 0440 07/07/24 0104   07/02/24 0500  micafungin  (MYCAMINE ) 100 mg in sodium chloride  0.9 % 100 mL IVPB        100 mg 105 mL/hr over 1 Hours Intravenous Daily 07/02/24 0315 07/06/24 1123   07/02/24  0145  piperacillin -tazobactam (ZOSYN ) IVPB 3.375 g        3.375 g 100 mL/hr over 30 Minutes Intravenous  Once 07/02/24 0134 07/02/24 0354   07/01/24 2237  ceFAZolin  (ANCEF ) 2-4 GM/100ML-% IVPB       Note to Pharmacy: Roddie Grate: cabinet override      07/01/24 2237 07/02/24 1044        Assessment/Plan POD6 s/p Exploratory laparotomy with [name] repair of perforated peptic ulcer by Dr. Teresa on 07/02/24 for Perforated pyloric channel peptic ulcer  - UGI POD3 negative - adv to reg diet - Cont JP. SS; plan to remove prior to D/C - Completed zosyn /micafungin  - H pylori positive, discussed with pharmacist and will treat with additional 14d doxy/flagyl  - Cont BID PPI, added bismuth  - PRN IV pain meds. Avoid NSAIDs - Mobilize, PT - recs for CIR currently  - Pulm toilet - CBC pending today, AM labs ordered   FEN - Soft diet, SLIV VTE - SCDs, ok to resume Lovenox   ID - Zosyn , Micafungin   Foley - removed 7/13, voiding Dispo- CIR if can progress with therapies   Hx of UC on 40 mg prednisone  per day - was on  solucortef, now on prednisone  40 mg daily. Dr. Josiah following. Plan to start infliximab  in a couple of weeks.  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

## 2024-07-09 DIAGNOSIS — A0472 Enterocolitis due to Clostridium difficile, not specified as recurrent: Secondary | ICD-10-CM | POA: Diagnosis not present

## 2024-07-09 DIAGNOSIS — K255 Chronic or unspecified gastric ulcer with perforation: Secondary | ICD-10-CM | POA: Diagnosis not present

## 2024-07-09 DIAGNOSIS — Z9889 Other specified postprocedural states: Secondary | ICD-10-CM

## 2024-07-09 DIAGNOSIS — A048 Other specified bacterial intestinal infections: Secondary | ICD-10-CM

## 2024-07-09 DIAGNOSIS — K5100 Ulcerative (chronic) pancolitis without complications: Secondary | ICD-10-CM | POA: Diagnosis not present

## 2024-07-09 LAB — CBC
HCT: 30.4 % — ABNORMAL LOW (ref 36.0–46.0)
Hemoglobin: 9.4 g/dL — ABNORMAL LOW (ref 12.0–15.0)
MCH: 27.2 pg (ref 26.0–34.0)
MCHC: 30.9 g/dL (ref 30.0–36.0)
MCV: 87.9 fL (ref 80.0–100.0)
Platelets: 225 K/uL (ref 150–400)
RBC: 3.46 MIL/uL — ABNORMAL LOW (ref 3.87–5.11)
RDW: 17.2 % — ABNORMAL HIGH (ref 11.5–15.5)
WBC: 6.7 K/uL (ref 4.0–10.5)
nRBC: 0 % (ref 0.0–0.2)

## 2024-07-09 LAB — BASIC METABOLIC PANEL WITH GFR
Anion gap: 7 (ref 5–15)
BUN: 9 mg/dL (ref 8–23)
CO2: 30 mmol/L (ref 22–32)
Calcium: 7.9 mg/dL — ABNORMAL LOW (ref 8.9–10.3)
Chloride: 96 mmol/L — ABNORMAL LOW (ref 98–111)
Creatinine, Ser: <0.3 mg/dL — ABNORMAL LOW (ref 0.44–1.00)
Glucose, Bld: 150 mg/dL — ABNORMAL HIGH (ref 70–99)
Potassium: 3.5 mmol/L (ref 3.5–5.1)
Sodium: 133 mmol/L — ABNORMAL LOW (ref 135–145)

## 2024-07-09 MED ORDER — HYDROCORTISONE 100 MG/60ML RE ENEM
100.0000 mg | ENEMA | Freq: Every day | RECTAL | Status: DC
  Administered 2024-07-09 – 2024-07-17 (×8): 100 mg via RECTAL
  Filled 2024-07-09 (×12): qty 1

## 2024-07-09 NOTE — Plan of Care (Signed)
  Problem: Education: Goal: Knowledge of General Education information will improve Description: Including pain rating scale, medication(s)/side effects and non-pharmacologic comfort measures 07/09/2024 1655 by Christianne Fredie CROME, RN Outcome: Progressing 07/09/2024 1633 by Christianne Fredie CROME, RN Outcome: Progressing   Problem: Health Behavior/Discharge Planning: Goal: Ability to manage health-related needs will improve 07/09/2024 1655 by Christianne Fredie CROME, RN Outcome: Progressing 07/09/2024 1633 by Christianne Fredie CROME, RN Outcome: Progressing   Problem: Clinical Measurements: Goal: Will remain free from infection 07/09/2024 1655 by Christianne Fredie CROME, RN Outcome: Progressing 07/09/2024 1633 by Christianne Fredie CROME, RN Outcome: Progressing   Problem: Activity: Goal: Risk for activity intolerance will decrease 07/09/2024 1655 by Christianne Fredie CROME, RN Outcome: Progressing 07/09/2024 1633 by Christianne Fredie CROME, RN Outcome: Progressing   Problem: Nutrition: Goal: Adequate nutrition will be maintained 07/09/2024 1655 by Christianne Fredie CROME, RN Outcome: Progressing 07/09/2024 1633 by Christianne Fredie CROME, RN Outcome: Progressing   Problem: Elimination: Goal: Will not experience complications related to bowel motility 07/09/2024 1655 by Christianne Fredie CROME, RN Outcome: Progressing 07/09/2024 1633 by Christianne Fredie CROME, RN Outcome: Progressing Goal: Will not experience complications related to urinary retention 07/09/2024 1655 by Christianne Fredie CROME, RN Outcome: Progressing 07/09/2024 1633 by Christianne Fredie CROME, RN Outcome: Progressing   Problem: Pain Managment: Goal: General experience of comfort will improve and/or be controlled 07/09/2024 1655 by Christianne Fredie CROME, RN Outcome: Progressing 07/09/2024 1633 by Christianne Fredie CROME, RN Outcome: Progressing   Problem: Skin Integrity: Goal: Risk for impaired skin integrity will decrease 07/09/2024 1655 by Christianne Fredie CROME, RN Outcome: Progressing 07/09/2024 1633 by Christianne Fredie CROME, RN Outcome: Progressing

## 2024-07-09 NOTE — Progress Notes (Addendum)
 8 Days Post-Op  Subjective: Still having some diarrhea but it is nonbloody today. Mild LLQ pain present, unchanged from yesterday. Denies nausea/vomiting and is tolerating p.o. Daughter at bedside.  Objective: Vital signs in last 24 hours: Temp:  [97.5 F (36.4 C)-97.9 F (36.6 C)] 97.7 F (36.5 C) (07/19 0828) Pulse Rate:  [78-102] 90 (07/19 0828) Resp:  [16-20] 18 (07/19 0828) BP: (105-134)/(75-86) 109/75 (07/19 0828) SpO2:  [96 %-97 %] 96 % (07/19 0828) Last BM Date : 07/08/24  Intake/Output from previous day: 07/18 0701 - 07/19 0700 In: -  Out: 1185 [Urine:1075; Drains:110] Intake/Output this shift: No intake/output data recorded.  PE: Gen:  Alert, NAD, pleasant Card:  Reg Pulm:  Rate and effort normal Abd: Soft, nondistended, appropriately tender around incision which is  C/D/I with staples present, no cellulitis, hematoma, or other abnormal findings. JP drain serous- 110cc/24 hours. Ext:  mild edema of BL feet, no erythema or significant TTP of BL calves Psych: A&Ox3   Lab Results:  Recent Labs    07/08/24 1048 07/09/24 0409  WBC 7.5 6.7  HGB 9.4* 9.4*  HCT 30.1* 30.4*  PLT 236 225   BMET Recent Labs    07/08/24 0442 07/09/24 0409  NA  --  133*  K 4.4 3.5  CL  --  96*  CO2  --  30  GLUCOSE  --  150*  BUN  --  9  CREATININE  --  <0.30*  CALCIUM   --  7.9*   PT/INR No results for input(s): LABPROT, INR in the last 72 hours.  CMP     Component Value Date/Time   NA 133 (L) 07/09/2024 0409   K 3.5 07/09/2024 0409   CL 96 (L) 07/09/2024 0409   CO2 30 07/09/2024 0409   GLUCOSE 150 (H) 07/09/2024 0409   BUN 9 07/09/2024 0409   CREATININE <0.30 (L) 07/09/2024 0409   CALCIUM  7.9 (L) 07/09/2024 0409   PROT 4.3 (L) 07/02/2024 0629   ALBUMIN  1.9 (L) 07/02/2024 0629   AST 13 (L) 07/02/2024 0629   ALT 16 07/02/2024 0629   ALKPHOS 43 07/02/2024 0629   BILITOT 0.8 07/02/2024 0629   GFRNONAA NOT CALCULATED 07/09/2024 0409   Lipase      Component Value Date/Time   LIPASE <10 (L) 08/02/2021 2201    Studies/Results: No results found.    Anti-infectives: Anti-infectives (From admission, onward)    Start     Dose/Rate Route Frequency Ordered Stop   07/07/24 1430  tetracycline  (SUMYCIN ) capsule 500 mg        500 mg Oral 4 times daily 07/07/24 1344     07/07/24 1400  metroNIDAZOLE  (FLAGYL ) tablet 500 mg        500 mg Oral Every 8 hours 07/07/24 1018 07/21/24 1359   07/07/24 1115  doxycycline  (VIBRA -TABS) tablet 100 mg  Status:  Discontinued        100 mg Oral Every 12 hours 07/07/24 1018 07/07/24 1344   07/02/24 1000  piperacillin -tazobactam (ZOSYN ) IVPB 3.375 g        3.375 g 12.5 mL/hr over 240 Minutes Intravenous Every 8 hours 07/02/24 0440 07/07/24 0104   07/02/24 0500  micafungin  (MYCAMINE ) 100 mg in sodium chloride  0.9 % 100 mL IVPB        100 mg 105 mL/hr over 1 Hours Intravenous Daily 07/02/24 0315 07/06/24 1123   07/02/24 0145  piperacillin -tazobactam (ZOSYN ) IVPB 3.375 g        3.375 g 100  mL/hr over 30 Minutes Intravenous  Once 07/02/24 0134 07/02/24 0354   07/01/24 2237  ceFAZolin  (ANCEF ) 2-4 GM/100ML-% IVPB       Note to Pharmacy: Roddie Grate: cabinet override      07/01/24 2237 07/02/24 1044        Assessment/Plan POD7 s/p Exploratory laparotomy with [name] repair of perforated peptic ulcer by Dr. Teresa on 07/02/24 for Perforated pyloric channel peptic ulcer  - UGI POD3 negative - tolerating reg diet - Remove JP drain today - Completed zosyn /micafungin  - H pylori positive, discussed with pharmacist and will treat with additional 14d doxy/flagyl  - Cont BID PPI, added bismuth  - PRN IV pain meds. Avoid NSAIDs - Mobilize, PT - recs for CIR currently  - Pulm toilet  FEN - Reg diet, SLIV VTE - SCDs, ok to resume Lovenox   ID - Zosyn , Micafungin  completed, now on tetracycline , flagyl , PPI, bismuth  for h pylori tx- 2 week course Foley - removed 7/13, voiding Dispo- CIR if can progress  with therapies   Hx of UC on 40 mg prednisone  per day - was on solucortef, now on prednisone  20 mg BID. Dr. Josiah following. Plan to start infliximab  in a couple of weeks.  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

## 2024-07-09 NOTE — Plan of Care (Signed)
  Problem: Education: Goal: Knowledge of General Education information will improve Description: Including pain rating scale, medication(s)/side effects and non-pharmacologic comfort measures Outcome: Progressing   Problem: Health Behavior/Discharge Planning: Goal: Ability to manage health-related needs will improve Outcome: Progressing   Problem: Clinical Measurements: Goal: Will remain free from infection Outcome: Progressing   Problem: Activity: Goal: Risk for activity intolerance will decrease Outcome: Progressing   Problem: Nutrition: Goal: Adequate nutrition will be maintained Outcome: Progressing   Problem: Elimination: Goal: Will not experience complications related to bowel motility Outcome: Progressing Goal: Will not experience complications related to urinary retention Outcome: Progressing   Problem: Pain Managment: Goal: General experience of comfort will improve and/or be controlled Outcome: Progressing   Problem: Skin Integrity: Goal: Risk for impaired skin integrity will decrease Outcome: Progressing

## 2024-07-09 NOTE — Progress Notes (Addendum)
    Inpatient Progress Note   Dr. Suzann providing weekend coverage for [name]   Patient Profile/Chief Complaint  73 year old female with pan ulcerative colitis admitted with pyloric channel peptic ulcer complicated by perforation status post repair 07/02/2014.  H. pylori positive currently on quadruple therapy.   Interval History   -- Reports that abdominal pain continues to improve -- Tolerating oral intake -- Reports having 3-5 loose bowel movements a day; no blood last 2 days -- Having fecal urgency but no tenesmus    Objective   Vital signs in last 24 hours: Temp:  [97.5 F (36.4 C)-97.9 F (36.6 C)] 97.7 F (36.5 C) (07/19 0828) Pulse Rate:  [78-102] 90 (07/19 0828) Resp:  [16-20] 18 (07/19 0828) BP: (105-134)/(75-86) 109/75 (07/19 0828) SpO2:  [96 %-97 %] 96 % (07/19 0828) Last BM Date : 07/08/24 General:    Alert, sitting in chair, no distress Heart:  Regular rate and rhythm; no murmurs Lungs: Respirations even and unlabored, lungs CTA bilaterally Abdomen:  Soft, mild tenderness to palpation over mid abdomen and nondistended. Normal bowel sounds. Extremities:  Without edema. Neurologic:  Alert and oriented,  grossly normal neurologically. Psych:  Cooperative. Normal mood and affect.  Intake/Output from previous day: 07/18 0701 - 07/19 0700 In: -  Out: 1185 [Urine:1075; Drains:110] Intake/Output this shift: No intake/output data recorded.  Lab Results: Recent Labs    07/07/24 0556 07/08/24 1048 07/09/24 0409  WBC  --  7.5 6.7  HGB 9.3* 9.4* 9.4*  HCT 29.5* 30.1* 30.4*  PLT  --  236 225   BMET Recent Labs    07/07/24 0556 07/08/24 0442 07/09/24 0409  NA  --   --  133*  K 4.4 4.4 3.5  CL  --   --  96*  CO2  --   --  30  GLUCOSE  --   --  150*  BUN  --   --  9  CREATININE  --   --  <0.30*  CALCIUM   --   --  7.9*   LFT No results for input(s): PROT, ALBUMIN , AST, ALT, ALKPHOS, BILITOT, BILIDIR, IBILI in the last 72  hours. PT/INR No results for input(s): LABPROT, INR in the last 72 hours.  Studies/Results: Reviewed in epic  Endoscopic Studies: None   Clinical Impression   73 year old female with pan ulcerative colitis admitted with pyloric channel peptic ulcer complicated by perforation status post repair 07/02/2014.  H. pylori positive currently on quadruple therapy.  Clinically improving from her recent surgery.  Peptic ulcer likely a combination of H. pylori infection as well as corticosteroids.  Currently on quadruple therapy for management of H. pylori.  Reports that she continues to have loose stools and fecal urgency but there has been less blood in her bowel movements since increasing prednisone  from 20 mg daily to 40 mg daily.  Discussed the possibility of initiating hydrocortisone  enema to address her fecal urgency.   Plan  Continue H. pylori quadruple therapy Continue prednisone  40 mg orally daily Continue pantoprazole  40 mg IV twice daily Start hydrocortisone  enema 100 mg per rectum nightly to address fecal urgency Continue iron , thiamine  and zinc  supplements    LOS: 7 days   Allison Finley  07/09/2024, 11:18 AM  Allison Suzann, MD [facility]

## 2024-07-10 DIAGNOSIS — A0472 Enterocolitis due to Clostridium difficile, not specified as recurrent: Secondary | ICD-10-CM | POA: Diagnosis not present

## 2024-07-10 DIAGNOSIS — K5100 Ulcerative (chronic) pancolitis without complications: Secondary | ICD-10-CM | POA: Diagnosis not present

## 2024-07-10 DIAGNOSIS — K255 Chronic or unspecified gastric ulcer with perforation: Secondary | ICD-10-CM | POA: Diagnosis not present

## 2024-07-10 DIAGNOSIS — Z9889 Other specified postprocedural states: Secondary | ICD-10-CM | POA: Diagnosis not present

## 2024-07-10 LAB — BASIC METABOLIC PANEL WITH GFR
Anion gap: 8 (ref 5–15)
BUN: 10 mg/dL (ref 8–23)
CO2: 29 mmol/L (ref 22–32)
Calcium: 8.1 mg/dL — ABNORMAL LOW (ref 8.9–10.3)
Chloride: 96 mmol/L — ABNORMAL LOW (ref 98–111)
Creatinine, Ser: 0.32 mg/dL — ABNORMAL LOW (ref 0.44–1.00)
GFR, Estimated: >60 mL/min (ref >60)
Glucose, Bld: 150 mg/dL — ABNORMAL HIGH (ref 70–99)
Potassium: 4.4 mmol/L (ref 3.5–5.1)
Sodium: 133 mmol/L — ABNORMAL LOW (ref 135–145)

## 2024-07-10 LAB — CBC
HCT: 30.3 % — ABNORMAL LOW (ref 36.0–46.0)
Hemoglobin: 9.5 g/dL — ABNORMAL LOW (ref 12.0–15.0)
MCH: 27.6 pg (ref 26.0–34.0)
MCHC: 31.4 g/dL (ref 30.0–36.0)
MCV: 88.1 fL (ref 80.0–100.0)
Platelets: 211 K/uL (ref 150–400)
RBC: 3.44 MIL/uL — ABNORMAL LOW (ref 3.87–5.11)
RDW: 17.3 % — ABNORMAL HIGH (ref 11.5–15.5)
WBC: 7.8 K/uL (ref 4.0–10.5)
nRBC: 0 % (ref 0.0–0.2)

## 2024-07-10 LAB — MAGNESIUM: Magnesium: 1.9 mg/dL (ref 1.7–2.4)

## 2024-07-10 NOTE — Plan of Care (Signed)
  Problem: Education: Goal: Knowledge of General Education information will improve Description: Including pain rating scale, medication(s)/side effects and non-pharmacologic comfort measures Outcome: Progressing   Problem: Health Behavior/Discharge Planning: Goal: Ability to manage health-related needs will improve Outcome: Progressing   Problem: Clinical Measurements: Goal: Will remain free from infection Outcome: Progressing   Problem: Activity: Goal: Risk for activity intolerance will decrease Outcome: Progressing   Problem: Nutrition: Goal: Adequate nutrition will be maintained Outcome: Progressing   Problem: Elimination: Goal: Will not experience complications related to bowel motility Outcome: Progressing Goal: Will not experience complications related to urinary retention Outcome: Progressing   Problem: Pain Managment: Goal: General experience of comfort will improve and/or be controlled Outcome: Progressing   Problem: Skin Integrity: Goal: Risk for impaired skin integrity will decrease Outcome: Progressing

## 2024-07-10 NOTE — Progress Notes (Signed)
 9 Days Post-Op  Subjective: Feels well this morning, reports steroid suppository did help.  Drain is out.  Objective: Vital signs in last 24 hours: Temp:  [97.5 F (36.4 C)-97.8 F (36.6 C)] 97.5 F (36.4 C) (07/20 0402) Pulse Rate:  [85-99] 85 (07/20 0402) Resp:  [18] 18 (07/20 0402) BP: (100-117)/(72-79) 115/77 (07/20 0402) SpO2:  [94 %-96 %] 95 % (07/20 0402) Last BM Date : 07/09/24  Intake/Output from previous day: No intake/output data recorded. Intake/Output this shift: No intake/output data recorded.  PE: Gen:  Alert, NAD, pleasant Card:  Reg Pulm:  Rate and effort normal Abd: Soft, nondistended, nontender; incision C/D/I with staples present, no cellulitis, hematoma, or other abnormal findings.  Ext:  mild edema of BL feet, no erythema or significant TTP of BL calves Psych: A&Ox3   Lab Results:  Recent Labs    07/09/24 0409 07/10/24 0504  WBC 6.7 7.8  HGB 9.4* 9.5*  HCT 30.4* 30.3*  PLT 225 211   BMET Recent Labs    07/09/24 0409 07/10/24 0504  NA 133* 133*  K 3.5 4.4  CL 96* 96*  CO2 30 29  GLUCOSE 150* 150*  BUN 9 10  CREATININE <0.30* 0.32*  CALCIUM  7.9* 8.1*   PT/INR No results for input(s): LABPROT, INR in the last 72 hours.  CMP     Component Value Date/Time   NA 133 (L) 07/10/2024 0504   K 4.4 07/10/2024 0504   CL 96 (L) 07/10/2024 0504   CO2 29 07/10/2024 0504   GLUCOSE 150 (H) 07/10/2024 0504   BUN 10 07/10/2024 0504   CREATININE 0.32 (L) 07/10/2024 0504   CALCIUM  8.1 (L) 07/10/2024 0504   PROT 4.3 (L) 07/02/2024 0629   ALBUMIN  1.9 (L) 07/02/2024 0629   AST 13 (L) 07/02/2024 0629   ALT 16 07/02/2024 0629   ALKPHOS 43 07/02/2024 0629   BILITOT 0.8 07/02/2024 0629   GFRNONAA >60 07/10/2024 0504   Lipase     Component Value Date/Time   LIPASE <10 (L) 08/02/2021 2201    Studies/Results: No results found.    Anti-infectives: Anti-infectives (From admission, onward)    Start     Dose/Rate Route Frequency  Ordered Stop   07/07/24 1430  tetracycline  (SUMYCIN ) capsule 500 mg        500 mg Oral 4 times daily 07/07/24 1344     07/07/24 1400  metroNIDAZOLE  (FLAGYL ) tablet 500 mg        500 mg Oral Every 8 hours 07/07/24 1018 07/21/24 1359   07/07/24 1115  doxycycline  (VIBRA -TABS) tablet 100 mg  Status:  Discontinued        100 mg Oral Every 12 hours 07/07/24 1018 07/07/24 1344   07/02/24 1000  piperacillin -tazobactam (ZOSYN ) IVPB 3.375 g        3.375 g 12.5 mL/hr over 240 Minutes Intravenous Every 8 hours 07/02/24 0440 07/07/24 0104   07/02/24 0500  micafungin  (MYCAMINE ) 100 mg in sodium chloride  0.9 % 100 mL IVPB        100 mg 105 mL/hr over 1 Hours Intravenous Daily 07/02/24 0315 07/06/24 1123   07/02/24 0145  piperacillin -tazobactam (ZOSYN ) IVPB 3.375 g        3.375 g 100 mL/hr over 30 Minutes Intravenous  Once 07/02/24 0134 07/02/24 0354   07/01/24 2237  ceFAZolin  (ANCEF ) 2-4 GM/100ML-% IVPB       Note to Pharmacy: Roddie Grate: cabinet override      07/01/24 2237 07/02/24 1044  Assessment/Plan POD8 s/p Exploratory laparotomy with [name] repair of perforated peptic ulcer by Dr. Teresa on 07/02/24 for Perforated pyloric channel peptic ulcer  - UGI POD3 negative - tolerating reg diet.  Drain removed postop day 7. - Completed zosyn /micafungin  - H pylori positive, discussed with pharmacist and will treat with additional 14d quadruple therapy - Cont BID PPI, added bismuth  - PRN IV pain meds. Avoid NSAIDs - Mobilize, PT - recs for CIR currently  - Pulm toilet  FEN - Reg diet, SLIV VTE - SCDs, ok to resume Lovenox   ID - Zosyn , Micafungin  completed, now on tetracycline , flagyl , PPI, bismuth  for h pylori tx- 2 week course Foley - removed 7/13, voiding Dispo- CIR if can progress with therapies   Hx of UC on 40 mg prednisone  per day - was on solucortef, now on prednisone  20 mg BID. Dr. Josiah following. Plan to start infliximab  in a couple of weeks.  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

## 2024-07-10 NOTE — Progress Notes (Signed)
    Inpatient Progress Note   Dr. Suzann providing weekend coverage for [name]   Patient Profile/Chief Complaint  73 year old female with pan ulcerative colitis admitted with pyloric channel peptic ulcer complicated by perforation status post repair 07/02/2014.  H. pylori positive currently on quadruple therapy.   Interval History   -- Reports that abdominal pain continues to improve -- Tolerating oral intake -- 1 BM recorded in last 24 hours -unclear if this is correct -- Started hydrocortisone  enemas last night for fecal urgency which she tolerated -- JP drain was removed    Objective   Vital signs in last 24 hours: Temp:  [97.4 F (36.3 C)-97.8 F (36.6 C)] 97.4 F (36.3 C) (07/20 0741) Pulse Rate:  [85-104] 104 (07/20 0741) Resp:  [18] 18 (07/20 0402) BP: (100-117)/(72-79) 113/76 (07/20 0741) SpO2:  [94 %-98 %] 98 % (07/20 0741) Last BM Date : 07/09/24 General:    Alert, sitting in chair, no distress Heart:  Regular rate and rhythm; no murmurs Lungs: Respirations even and unlabored, lungs CTA bilaterally Abdomen:  Soft, mild tenderness to palpation over mid abdomen and nondistended. Normal bowel sounds. Extremities:  Without edema. Neurologic:  Alert and oriented,  grossly normal neurologically. Psych:  Cooperative. Normal mood and affect.  Intake/Output from previous day: No intake/output data recorded. Intake/Output this shift: No intake/output data recorded.  Lab Results: Recent Labs    07/08/24 1048 07/09/24 0409 07/10/24 0504  WBC 7.5 6.7 7.8  HGB 9.4* 9.4* 9.5*  HCT 30.1* 30.4* 30.3*  PLT 236 225 211   BMET Recent Labs    07/08/24 0442 07/09/24 0409 07/10/24 0504  NA  --  133* 133*  K 4.4 3.5 4.4  CL  --  96* 96*  CO2  --  30 29  GLUCOSE  --  150* 150*  BUN  --  9 10  CREATININE  --  <0.30* 0.32*  CALCIUM   --  7.9* 8.1*   LFT No results for input(s): PROT, ALBUMIN , AST, ALT, ALKPHOS, BILITOT, BILIDIR, IBILI in the last  72 hours. PT/INR No results for input(s): LABPROT, INR in the last 72 hours.  Studies/Results: Reviewed in epic  Endoscopic Studies: None   Clinical Impression   73 year old female with pan ulcerative colitis admitted with pyloric channel peptic ulcer complicated by perforation status post repair 07/02/2014.  H. pylori positive currently on quadruple therapy.  Clinically improving from her recent surgery.  Peptic ulcer likely a combination of H. pylori infection as well as corticosteroids.  Currently on quadruple therapy for management of H. pylori.  JP drain was removed today.  Reports that she continues to have loose stools and fecal urgency but there has been less blood in her bowel movements since increasing prednisone  from 20 mg daily to 40 mg daily.  Hydrocortisone  enemas were initiated 07/09/2024 to see if this ameliorates fecal urgency.  She tolerated this last night and thinks there may have been some benefit.  Discussed that she could continue these on a nightly basis for a few weeks as she is transitioning to biologic therapy.   Plan  Continue H. pylori quadruple therapy Continue prednisone  40 mg orally daily Continue pantoprazole  40 mg IV twice daily Continue hydrocortisone  enema 100 mg per rectum nightly to address fecal urgency Continue iron , thiamine  and zinc  supplements  Dr. Sybil to resume rounding responsibilities 07/10/2024.   LOS: 8 days   Allison Finley  07/10/2024, 11:28 AM  Allison Suzann, MD [facility]

## 2024-07-11 NOTE — Progress Notes (Signed)
 [facility]ICINE [facility] RE[facility]ILIT[facility]  CONSULT SERVICE NOTE   Pt with improvement in abdominal pain and blood in stools with increase of prednisone . Still having some loose stools and urgency by report. With OT this morning she was mod assist for sit-std transfers and supervision to max assist with ADL's (upper vs lower ext). She is at a point where she is medically and functionally appropriate for inpatient rehab. Rehab Admissions Coordinator to follow up.    Allison IVAR Gunther, MD, [facility] Physical Medicine & Rehabilitation Medical Director Rehabilitation Services 07/11/2024

## 2024-07-11 NOTE — Progress Notes (Signed)
 Subjective: Patient seems to be doing fairly well today.  She has had 3 small-volume BMs this morning which are still loose but there is no blood or mucus in the stool.  She feels the hydrocortisone  enemas are helping.  She is advanced to soft low residue diet and is tolerating that well.  She denies having any abdominal pain unless she is bent over in her chair when she has some  discomfort. She walked around for quite a while with physical therapy this morning and is feeling more encouraged today.  Her daughter is at the bedside.  Objective: Vital signs in last 24 hours: Temp:  [96.4 F (35.8 C)-97.8 F (36.6 C)] 97.5 F (36.4 C) (07/21 0744) Pulse Rate:  [81-103] 95 (07/21 0744) Resp:  [18-20] 18 (07/21 0744) BP: (107-114)/(67-82) 109/67 (07/21 0744) SpO2:  [94 %-97 %] 95 % (07/21 0744) Last BM Date : 07/09/24  Intake/Output from previous day: 07/20 0701 - 07/21 0700 In: -  Out: 400 [Urine:400] Intake/Output this shift: Total I/O In: 120 [P.O.:120] Out: -   General appearance: alert, cooperative, appears stated age, and no distress Resp: clear to auscultation bilaterally Cardio: regular rate and rhythm, S1, S2 normal, no murmur, click, rub or gallop GI: soft, non-tender; bowel sounds normal; no masses,  no organomegaly  Lab Results: Recent Labs    07/09/24 0409 07/10/24 0504  WBC 6.7 7.8  HGB 9.4* 9.5*  HCT 30.4* 30.3*  PLT 225 211   BMET Recent Labs    07/09/24 0409 07/10/24 0504  NA 133* 133*  K 3.5 4.4  CL 96* 96*  CO2 30 29  GLUCOSE 150* 150*  BUN 9 10  CREATININE <0.30* 0.32*  CALCIUM  7.9* 8.1*   Medications: I have reviewed the patient's current medications. Prior to Admission:  Medications Prior to Admission  Medication Sig Dispense Refill Last Dose/Taking   clobetasol cream (TEMOVATE) 0.05 % Apply 1 Application topically daily as needed (psoriasis).   Unknown   Cyanocobalamin (VITAMIN B-12 PO) Take 1 capsule by mouth daily.   07/01/2024    fluticasone (FLONASE) 50 MCG/ACT nasal spray Place 2 sprays into both nostrils daily as needed for allergies.   Unknown   levothyroxine  (SYNTHROID ) 112 MCG tablet Take 112 mcg by mouth daily.   07/01/2024   omeprazole  (PRILOSEC) 40 MG capsule Take 40 mg by mouth daily as needed (reflux).   Unknown   [EXPIRED] predniSONE  (DELTASONE ) 10 MG tablet Take 4 tablets (40 mg total) by mouth daily for 10 days, THEN 3 tablets (30 mg total) daily for 10 days, THEN 2 tablets (20 mg total) daily for 10 days, THEN 1 tablet (10 mg total) daily for 10 days. (Patient taking differently: Take 20 mg by mouth in the morning and 20 mg by mouth at night ) 100 tablet 0 07/01/2024   rosuvastatin  (CRESTOR ) 5 MG tablet Take 5 mg by mouth at bedtime.   06/30/2024   Zinc  Oxide-Vitamin C  (ZINC  PLUS VITAMIN C  PO) Take 1 tablet by mouth daily.   07/01/2024   mesalamine (APRISO) 0.375 g 24 hr capsule Take 1.5 g by mouth every morning. (Patient not taking: Reported on 07/02/2024)   Not Taking   Scheduled:  sodium chloride    Intravenous Once   acetaminophen   1,000 mg Oral Q6H   ascorbic acid   500 mg Oral BID   bismuth  subsalicylate  30 mL Oral TID AC & HS   feeding supplement (KATE FARMS STANDARD 1.4)  325 mL Oral TID BM  ferrous sulfate   325 mg Oral BID WC   hydrocortisone   100 mg Rectal QHS   levothyroxine   112 mcg Oral Daily   melatonin  3 mg Oral QHS   metroNIDAZOLE   500 mg Oral Q8H   multivitamin with minerals  1 tablet Oral Daily   pantoprazole  (PROTONIX ) IV  40 mg Intravenous Q12H   predniSONE   20 mg Oral BID WC   tetracycline   500 mg Oral QID   thiamine   100 mg Oral Daily   zinc  sulfate (50mg  elemental zinc )  220 mg Oral Daily   Continuous: PRN:diphenhydrAMINE  **OR** [DISCONTINUED] diphenhydrAMINE , HYDROmorphone  (DILAUDID ) injection, metoprolol  tartrate, ondansetron  **OR** ondansetron  (ZOFRAN ) IV, traMADol   Assessment/Plan: 1) Postoperative day 9 after exploratory laparotomy with [name] repair of perforated  peptic ulcer-on quadruple therapy for H. Pylori. 2) Ulcerative colitis on Prednisone  and Hydrocortisone  enemas; she is going to have Biologics to be started in another week.  Infliximab  has been ordered for her. 3) Anemia of chronic disease-on iron  thiamine  and zinc  supplements. 4) Hyponatremia-monitor closely. 5) Hypocalcemia  LOS: 9 days   Renaye Sous 07/11/2024, 1:12 PM

## 2024-07-11 NOTE — Progress Notes (Signed)
 Physical Therapy Treatment Patient Details Name: Allison Finley MRN: [id number] DOB: March 07, 1951 Today's Date: 07/11/2024   History of Present Illness Pt is a 73 y.o. female who presented 07/01/24 with severe abdominal pain. Pt found to have perforated pyloric channel peptic ulcer, s/p ex lap with repair 7/12. PMH: ulcerative colitis    PT Comments  Patient eager to work with PT. Up in recliner on arrival. Attempted sit to stand with +1 mod assist with pt able to clear buttocks off chair, however could not fully come to stand. Ultimately required +2 mod assist to fully stand. Once standing, she is able to maintain good upright posture and walked 100 ft with RW with close chair follow for safety/confidence. She required cues for position in RW and to incr step height/length. Patient will benefit from continued inpatient follow up therapy, >3 hours/day     If plan is discharge home, recommend the following: A lot of help with bathing/dressing/bathroom;Assistance with cooking/housework;Assist for transportation;Help with stairs or ramp for entrance;Two people to help with walking and/or transfers   Can travel by private vehicle        Equipment Recommendations  None recommended by PT    Recommendations for Other Services       Precautions / Restrictions Precautions Precautions: Fall Recall of Precautions/Restrictions: Intact Precaution/Restrictions Comments: abdominal precautions Restrictions Weight Bearing Restrictions Per Provider Order: No     Mobility  Bed Mobility               General bed mobility comments: up in recliner and wanted to stay OOB    Transfers Overall transfer level: Needs assistance Equipment used: Rolling walker (2 wheels) Transfers: Sit to/from Stand Sit to Stand: Mod assist, +2 physical assistance           General transfer comment: attempted +1 assist from chair with bil UEs pushing off armrests with hips cleared but unable to extend  hips/knees fully; required +2    Ambulation/Gait Ambulation/Gait assistance: Min assist, +2 safety/equipment Gait Distance (Feet): 100 Feet Assistive device: Rolling walker (2 wheels) Gait Pattern/deviations: Step-through pattern, Decreased stride length, Narrow base of support, Trunk flexed Gait velocity: decr Gait velocity interpretation: <1.31 ft/sec, indicative of household ambulator   General Gait Details: close chair follow for pt comfort/confidence;  cues for placement in RW (tends to get too close to the front), and increasing foot clearance and heel-toe gait   Stairs             Wheelchair Mobility     Tilt Bed    Modified Rankin (Stroke Patients Only)       Balance Overall balance assessment: Needs assistance Sitting-balance support: No upper extremity supported, Feet supported Sitting balance-Leahy Scale: Fair     Standing balance support: Reliant on assistive device for balance, Bilateral upper extremity supported Standing balance-Leahy Scale: Poor Standing balance comment: reliant on RW                            Communication Communication Communication: No apparent difficulties  Cognition Arousal: Alert Behavior During Therapy: Flat affect   PT - Cognitive impairments: Initiation, Awareness, Problem solving, Sequencing                       PT - Cognition Comments: anxiety with mobility, benefits from encouragement and close chair follow when OOB to build confidence Following commands: Intact Following commands impaired: Follows multi-step commands with increased time  Cueing Cueing Techniques: Verbal cues, Tactile cues  Exercises General Exercises - Lower Extremity Ankle Circles/Pumps: AROM, Both, 10 reps    General Comments        Pertinent Vitals/Pain Pain Assessment Pain Assessment: Faces Faces Pain Scale: Hurts little more Pain Location: buttocks Pain Descriptors / Indicators: Sore Pain Intervention(s):  Limited activity within patient's tolerance, Monitored during session, Other (comment) (using blue cushion in chair)    Home Living                          Prior Function            PT Goals (current goals can now be found in the care plan section) Acute Rehab PT Goals Patient Stated Goal: to return to being independent Time For Goal Achievement: 07/16/24 Potential to Achieve Goals: Good Progress towards PT goals: Progressing toward goals    Frequency    Min 2X/week      PT Plan      Co-evaluation              AM-PAC PT 6 Clicks Mobility   Outcome Measure  Help needed turning from your back to your side while in a flat bed without using bedrails?: A Little Help needed moving from lying on your back to sitting on the side of a flat bed without using bedrails?: A Lot Help needed moving to and from a bed to a chair (including a wheelchair)?: Total Help needed standing up from a chair using your arms (e.g., wheelchair or bedside chair)?: Total Help needed to walk in hospital room?: Total Help needed climbing 3-5 steps with a railing? : Total 6 Click Score: 9    End of Session Equipment Utilized During Treatment: Gait belt Activity Tolerance: Patient tolerated treatment well Patient left: in chair;with call bell/phone within reach Nurse Communication: Mobility status PT Visit Diagnosis: Other abnormalities of gait and mobility (R26.89);Muscle weakness (generalized) (M62.81);Pain Pain - Right/Left:  (abdomen) Pain - part of body:  (abdomen)     Time: 8681-8660 PT Time Calculation (min) (ACUTE ONLY): 21 min  Charges:    $Gait Training: 8-22 mins PT General Charges $$ ACUTE PT VISIT: 1 Visit                      Macario RAMAN, PT Acute Rehabilitation Services  Office [phone]    [name] 07/11/2024, 2:43 PM

## 2024-07-11 NOTE — Plan of Care (Signed)
  Problem: Education: Goal: Knowledge of General Education information will improve Description: Including pain rating scale, medication(s)/side effects and non-pharmacologic comfort measures Outcome: Progressing   Problem: Health Behavior/Discharge Planning: Goal: Ability to manage health-related needs will improve Outcome: Progressing   Problem: Clinical Measurements: Goal: Will remain free from infection Outcome: Progressing   Problem: Activity: Goal: Risk for activity intolerance will decrease Outcome: Progressing   Problem: Nutrition: Goal: Adequate nutrition will be maintained Outcome: Progressing   Problem: Elimination: Goal: Will not experience complications related to bowel motility Outcome: Progressing Goal: Will not experience complications related to urinary retention Outcome: Progressing   Problem: Pain Managment: Goal: General experience of comfort will improve and/or be controlled Outcome: Progressing   Problem: Skin Integrity: Goal: Risk for impaired skin integrity will decrease Outcome: Progressing

## 2024-07-11 NOTE — Progress Notes (Signed)
 Occupational Therapy Treatment Patient Details Name: Quantavia Frith MRN: [id number] DOB: 30-May-1951 Today's Date: 07/11/2024   History of present illness Pt is a 73 y.o. female who presented 07/01/24 with severe abdominal pain. Pt found to have perforated pyloric channel peptic ulcer, s/p ex lap with repair 7/12. PMH: ulcerative colitis   OT comments  Patient continues to participate well, and is motivated to return to her prior level of function.  Patient needing Mod A for bed mobility, up to Max A for lower body ADL, but has improved sitting tolerance and balance needing supervision for seated grooming and upper body ADL.  OT to continue efforts in the acute setting, and Patient will benefit from intensive inpatient follow-up therapy, >3 hours/day.  With agressive rehab, patient does demonstrate the ability to reach a Mod I level.        If plan is discharge home, recommend the following:  Assist for transportation;Assistance with cooking/housework;A lot of help with bathing/dressing/bathroom;A little help with walking and/or transfers   Equipment Recommendations  None recommended by OT    Recommendations for Other Services      Precautions / Restrictions Precautions Precautions: Fall Restrictions Weight Bearing Restrictions Per Provider Order: No       Mobility Bed Mobility Overal bed mobility: Needs Assistance Bed Mobility: Supine to Sit     Supine to sit: Mod assist          Transfers Overall transfer level: Needs assistance Equipment used: 1 person hand held assist Transfers: Sit to/from Stand, Bed to chair/wheelchair/BSC Sit to Stand: Mod assist     Step pivot transfers: Min assist, Mod assist           Balance Overall balance assessment: Needs assistance Sitting-balance support: No upper extremity supported, Feet supported Sitting balance-Leahy Scale: Fair     Standing balance support: Reliant on assistive device for balance, Bilateral upper  extremity supported Standing balance-Leahy Scale: Poor                             ADL either performed or assessed with clinical judgement   ADL Overall ADL's : Needs assistance/impaired Eating/Feeding: Set up;Sitting   Grooming: Set up;Sitting   Upper Body Bathing: Set up;Sitting   Lower Body Bathing: Sit to/from stand;Maximal assistance   Upper Body Dressing : Supervision/safety;Sitting   Lower Body Dressing: Maximal assistance;Sit to/from stand   Toilet Transfer: Moderate assistance;Stand-pivot                  Extremity/Trunk Assessment Upper Extremity Assessment Upper Extremity Assessment: Generalized weakness   Lower Extremity Assessment Lower Extremity Assessment: Defer to PT evaluation   Cervical / Trunk Assessment Cervical / Trunk Assessment: Other exceptions Cervical / Trunk Exceptions: abdominal incisions    Vision Patient Visual Report: No change from baseline     Perception Perception Perception: Not tested   Praxis Praxis Praxis: Not tested   Communication Communication Communication: No apparent difficulties   Cognition Arousal: Alert Behavior During Therapy: Flat affect Cognition: No apparent impairments                               Following commands: Intact        Cueing   Cueing Techniques: Verbal cues, Tactile cues  Exercises      Shoulder Instructions       General Comments      Pertinent Vitals/  Pain       Pain Assessment Pain Assessment: Faces Faces Pain Scale: Hurts little more Pain Location: buttocks Pain Descriptors / Indicators: Grimacing, Sore Pain Intervention(s): Monitored during session, Premedicated before session                                                          Frequency  Min 2X/week        Progress Toward Goals  OT Goals(current goals can now be found in the care plan section)  Progress towards OT goals: Progressing toward  goals  Acute Rehab OT Goals OT Goal Formulation: With patient Time For Goal Achievement: 07/18/24 Potential to Achieve Goals: Good  Plan      Co-evaluation                 AM-PAC OT 6 Clicks Daily Activity     Outcome Measure   Help from another person eating meals?: A Little Help from another person taking care of personal grooming?: A Little Help from another person toileting, which includes using toliet, bedpan, or urinal?: A Lot Help from another person bathing (including washing, rinsing, drying)?: A Lot Help from another person to put on and taking off regular upper body clothing?: A Little Help from another person to put on and taking off regular lower body clothing?: A Lot 6 Click Score: 15    End of Session Equipment Utilized During Treatment: Gait belt  OT Visit Diagnosis: Unsteadiness on feet (R26.81);Muscle weakness (generalized) (M62.81)   Activity Tolerance Patient tolerated treatment well   Patient Left in chair;with call bell/phone within reach   Nurse Communication Mobility status        Time: 9094-9070 OT Time Calculation (min): 24 min  Charges: OT General Charges $OT Visit: 1 Visit OT Treatments $Self Care/Home Management : 23-37 mins  07/11/2024  RP, OTR/L  Acute Rehabilitation Services  Office:  [phone]   Charlie JONETTA Halsted 07/11/2024, 9:32 AM

## 2024-07-11 NOTE — Progress Notes (Signed)
 10 Days Post-Op  Subjective: CC: No abdominal pain at rest. She gets full after taking all her medications. Eating 75% of her meals. No n/v. BM yesterday that was non-bloody. Voiding. Up to the chair this morning.   Afebrile. Tachycardia resolved. No hypotension. WBC wnl on last check. Hgb stable on last check.   Objective: Vital signs in last 24 hours: Temp:  [96.1 F (35.6 C)-97.8 F (36.6 C)] 97.5 F (36.4 C) (07/21 0744) Pulse Rate:  [81-103] 95 (07/21 0744) Resp:  [18-20] 18 (07/21 0744) BP: (96-114)/(65-82) 109/67 (07/21 0744) SpO2:  [94 %-97 %] 95 % (07/21 0744) Last BM Date : 07/09/24  Intake/Output from previous day: 07/20 0701 - 07/21 0700 In: -  Out: 400 [Urine:400] Intake/Output this shift: Total I/O In: 120 [P.O.:120] Out: -   PE: Gen:  Alert, NAD, pleasant Abd: Soft, no distension, reports twinges to mid right and left abdomen but is otherwise NT. Midline wound with drain with staples cdi  Lab Results:  Recent Labs    07/09/24 0409 07/10/24 0504  WBC 6.7 7.8  HGB 9.4* 9.5*  HCT 30.4* 30.3*  PLT 225 211   BMET Recent Labs    07/09/24 0409 07/10/24 0504  NA 133* 133*  K 3.5 4.4  CL 96* 96*  CO2 30 29  GLUCOSE 150* 150*  BUN 9 10  CREATININE <0.30* 0.32*  CALCIUM  7.9* 8.1*   PT/INR No results for input(s): LABPROT, INR in the last 72 hours. CMP     Component Value Date/Time   NA 133 (L) 07/10/2024 0504   K 4.4 07/10/2024 0504   CL 96 (L) 07/10/2024 0504   CO2 29 07/10/2024 0504   GLUCOSE 150 (H) 07/10/2024 0504   BUN 10 07/10/2024 0504   CREATININE 0.32 (L) 07/10/2024 0504   CALCIUM  8.1 (L) 07/10/2024 0504   PROT 4.3 (L) 07/02/2024 0629   ALBUMIN  1.9 (L) 07/02/2024 0629   AST 13 (L) 07/02/2024 0629   ALT 16 07/02/2024 0629   ALKPHOS 43 07/02/2024 0629   BILITOT 0.8 07/02/2024 0629   GFRNONAA >60 07/10/2024 0504   Lipase     Component Value Date/Time   LIPASE <10 (L) 08/02/2021 2201    Studies/Results: No results  found.  Anti-infectives: Anti-infectives (From admission, onward)    Start     Dose/Rate Route Frequency Ordered Stop   07/07/24 1430  tetracycline  (SUMYCIN ) capsule 500 mg        500 mg Oral 4 times daily 07/07/24 1344     07/07/24 1400  metroNIDAZOLE  (FLAGYL ) tablet 500 mg        500 mg Oral Every 8 hours 07/07/24 1018 07/21/24 1359   07/07/24 1115  doxycycline  (VIBRA -TABS) tablet 100 mg  Status:  Discontinued        100 mg Oral Every 12 hours 07/07/24 1018 07/07/24 1344   07/02/24 1000  piperacillin -tazobactam (ZOSYN ) IVPB 3.375 g        3.375 g 12.5 mL/hr over 240 Minutes Intravenous Every 8 hours 07/02/24 0440 07/07/24 0104   07/02/24 0500  micafungin  (MYCAMINE ) 100 mg in sodium chloride  0.9 % 100 mL IVPB        100 mg 105 mL/hr over 1 Hours Intravenous Daily 07/02/24 0315 07/06/24 1123   07/02/24 0145  piperacillin -tazobactam (ZOSYN ) IVPB 3.375 g        3.375 g 100 mL/hr over 30 Minutes Intravenous  Once 07/02/24 0134 07/02/24 0354   07/01/24 2237  ceFAZolin  (ANCEF ) 2-4  GM/100ML-% IVPB       Note to Pharmacy: Roddie Grate: cabinet override      07/01/24 2237 07/02/24 1044        Assessment/Plan POD 9 s/p Exploratory laparotomy with [name] repair of perforated peptic ulcer by Dr. Teresa on 07/02/24 for Perforated pyloric channel peptic ulcer  - UGI POD3 negative - tolerating reg diet.  Drain removed postop day 7. - Completed zosyn /micafungin  - H pylori positive, discussed with pharmacist and will treat with additional 14d quadruple therapy. GI following.  - Cont BID PPI, added bismuth  - PRN IV pain meds. Avoid NSAIDs - Mobilize, PT - recs for CIR currently  - Pulm toilet   FEN - Reg diet, SLIV VTE - SCDs, ok to resume Lovenox   ID - Zosyn , Micafungin  completed, now on tetracycline , flagyl , PPI, bismuth  for h pylori tx- 2 week course Foley - removed 7/13, voiding Dispo- CIR if can progress with therapies    Hx of UC on 40 mg prednisone  per day - was on  solucortef, now on prednisone  20 mg BID. Dr. Josiah following. Plan to start infliximab  in a couple of weeks.  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

## 2024-07-11 NOTE — Progress Notes (Signed)
 Inpatient Rehab Admissions Coordinator:   Met with patient and advised that once all updated therapy notes are in system we can begin authorization process with insurance. Will continue to follow for potential CIR admission.   Rehab Admissons Coordinator Cabe Lashley, [place], [place] [phone]

## 2024-07-12 ENCOUNTER — Encounter (HOSPITAL_COMMUNITY): Payer: Self-pay

## 2024-07-12 MED ORDER — ACETAMINOPHEN 500 MG PO TABS: 1000.0000 mg | ORAL_TABLET | Freq: Four times a day (QID) | ORAL | Status: DC | PRN

## 2024-07-12 MED ORDER — BANATROL TF EN LIQD
60.0000 mL | Freq: Two times a day (BID) | ENTERAL | Status: DC
  Administered 2024-07-12 – 2024-07-13 (×4): 60 mL via ORAL
  Filled 2024-07-12 (×4): qty 60

## 2024-07-12 NOTE — Progress Notes (Signed)
 Nutrition Follow-up  DOCUMENTATION CODES:   Severe malnutrition in context of acute illness/injury  INTERVENTION:   Encourage PO intake - currently on Regular diet [employer]ms 1.4 PO TID, each supplement provides 455 kcal and 20 grams protein. Add Banatrol BID-provides 45kcal, 5g soluble fiber and 2g protein per serving for loose stools.  Continue MVI with minerals daily   Continue 500 mg Vitamin C  and 220 mg Zinc  sulfate x 14 days for wound healing   *Pt is vegetarian but still consumes dairy, avoids beef collagen and food dyes *Pt cannot have Juven or Prosource as it contains beef collagen   NUTRITION DIAGNOSIS:   Severe Malnutrition related to acute illness as evidenced by energy intake < or equal to 50% for > or equal to 5 days, severe muscle depletion, severe fat depletion. - Still applicable   GOAL:   Patient will meet greater than or equal to 90% of their needs - Progressing   MONITOR:   Diet advancement, Weight trends, I & O's, Labs, Skin  REASON FOR ASSESSMENT:   Consult Wound healing  ASSESSMENT:   73 y.o. female who presented 07/01/24 with severe abdominal pain. Pt found to have perforated pyloric channel peptic ulcer, s/p ex lap with repair 7/12. PMH: ulcerative colitis.   7/12 - s/p ex lap with [name] repair of perforated pyloric channel peptic ulcer   Pt reports she is eating around 75% of her meals. Has an appetite, denies N/V and abdominal pain. Does complain of having multiple loose stools per day after eatigng, had 3 yesterday and 4 this morning. Pt does report she had bloody loose stools PTA. Her current stools have not been bloody. Does have Hydrocortisone  enema ordered daily. Pt refused yesterday because she thought she would get better. Pt states she will do them from now on because they have helped. Discussed the addition of Banatrol to help with lose stools, pt amendable.   Had oatmeal, soy milk, blueberry yogurt with rice krispies, and tea for  breakfast this morning.  Drinking 1.5 [employer] farms per day. CIR following.   Admit weight: 59 kg Current weight: 60.1 kg   Average Meal Intake: 7/21: 100% intake x 1 recorded meals  Nutritionally Relevant Medications: Scheduled Meds:  sodium chloride    Intravenous Once   ascorbic acid   500 mg Oral BID   bismuth  subsalicylate  30 mL Oral TID AC & HS   feeding supplement (K[employer] STANDARD 1.4)  325 mL Oral TID BM   ferrous sulfate   325 mg Oral BID WC   hydrocortisone   100 mg Rectal QHS   levothyroxine   112 mcg Oral Daily   melatonin  3 mg Oral QHS   metroNIDAZOLE   500 mg Oral Q8H   multivitamin with minerals  1 tablet Oral Daily   pantoprazole  (PROTONIX ) IV  40 mg Intravenous Q12H   predniSONE   20 mg Oral BID WC   tetracycline   500 mg Oral QID   zinc  sulfate (50mg  elemental zinc )  220 mg Oral Daily   Labs Reviewed: Sodium 133 Creatinine 0.32 Calcium  8.1 Phosphorus 2.1 Albumin  1.9 AST 13 Total protein 4.3 CBG ranges from 55-73 mg/dL over the last 24 hours No A1C   Diet Order:   Diet Order             Diet regular Room service appropriate? Yes; Fluid consistency: Thin  Diet effective now                   EDUCATION  NEEDS:   Education needs have been addressed  Skin:  Skin Assessment: Skin Integrity Issues: Skin Integrity Issues:: Stage III Stage III: L and R buttocks  Last BM:  07/04/2024  Height:   Ht Readings from Last 1 Encounters:  07/01/24 5' 2 (1.575 m)    Weight:   Wt Readings from Last 1 Encounters:  07/04/24 60.1 kg    Ideal Body Weight:  50 kg  BMI:  Body mass index is 24.23 kg/m.  Estimated Nutritional Needs:   Kcal:  1500-1700 kcal  Protein:  80-100 gm  Fluid:  >1.5L/day  Olivia Kenning, RD Registered Dietitian  See Amion for more information

## 2024-07-12 NOTE — Progress Notes (Signed)
   Inpatient Rehabilitation Admissions Coordinator   I met with patient at bedside. Dr Babs to do peer to peer with [facility] for possible CIR admit. I reviewed estimated cost of care. I await insurance approval to admit.  Heron Leavell, RN, MSN Rehab Admissions Coordinator [phone] 07/12/2024 10:42 AM

## 2024-07-12 NOTE — Progress Notes (Signed)
   Inpatient Rehabilitation Admissions Coordinator   I have received a denial from [facility] after peer to peer with Dr Babs for CIR admit. I met with patient at bedside and she is aware. I contacted daughter and she is aware. I will begin appeal.  Heron Leavell, RN, MSN Rehab Admissions Coordinator [phone] 07/12/2024 2:30 PM

## 2024-07-12 NOTE — TOC Progression Note (Signed)
 Transition of Care [facility]) - Progression Note    Patient Details  Name: Allison Finley MRN: [id number] Date of Birth: 02-20-1951  [facility] CM/SW Contact  Nola Devere Hands, RN Phone Number: 07/12/2024, 9:37 AM  Clinical Narrative:    [facility] Team continuing to follow, CIR is following for potential admission.   Expected Discharge Plan: IP Rehab Facility Barriers to Discharge: Continued Medical Work up  Expected Discharge Plan and Services   Discharge Planning Services: CM Consult Post Acute Care Choice: IP Rehab Living arrangements for the past 2 months: Single Family Home                                       Social Determinants of Health (SDOH) Interventions SDOH Screenings   Food Insecurity: Unknown (07/02/2024)  Housing: Low Risk  (07/02/2024)  Transportation Needs: No Transportation Needs (07/02/2024)  Utilities: Not At Risk (07/02/2024)  Social Connections: Moderately Isolated (07/02/2024)  Tobacco Use: Low Risk  (07/01/2024)  Recent Concern: Tobacco Use - Medium Risk (06/06/2024)   Received from [facility]    Readmission Risk Interventions     No data to display

## 2024-07-12 NOTE — H&P (Incomplete)
 Physical Medicine and Rehabilitation Admission H&P    Chief Complaint  Patient presents with   Functional deficits due to debility    HPI: Allison Finley is a 73 year old female with history of diverticular disease, psoriasis, Zoster, GERD, recent admission 05/29-06/03/25 for rectal bleeding with diagnosis of severe diffuse colitis felt to be ulcerative colitis and iron  deficiency anemia who was discharged to home on prednisone  as well as IV iron  infusions on outpatient basis. She was readmitted on 07/01/24 with progressive abdominal pain and found to have large anterior pyloric channel ulcer with intraperitoneal gas and ascites with suspicion of early perforation (as well as incidental findings of non specific colonic colitis, paracentral hernia, lumbar spondylosis with DDD causing impingement of L4/5 and L5/S1 as well as substantial cystocele with pelvic laxity). She was taken to OR emergently for Exp lap with [name] repair of perforated ulcer with placement of blake drain RUQ by Dr. Teresa on 07/12 am. NGT with bilious output and she was extubated to 2 L per Day. Patient reported continuing prednisone  40 mg PTA and was switched to stress dose solu cortef . Was maintained on PPI bid w/atbx and antifungals for 5 days. UGI done on 07/15 and showed normal passage of contrast without gastric leak or extravasation and diet has been advanced to regular.   She has had  hematuria and hematochezia. Stools positive for H pylori and treated with Flagyl , Pepto-bismol and tetracycline  X 14 days thru 7/31. Hydrocortisone  enemas for bloody stools and follow up CBC showed H/H to be relatively stable.   Dr. Kristie consulted and starting Infliximab  on 7/25 as planned.  as well as   Lives alone and independent PTA. Daughter is a Charity fundraiser.    ROS   Past Medical History:  Diagnosis Date   Cystocele, unspecified    Diverticulosis    GERD (gastroesophageal reflux disease)    Hematochezia    Hypothyroid     Inflammatory bowel disease (ulcerative colitis) (HCC) 05/20/2024   Iron  deficiency anemia due to chronic blood loss    Psoriasis     Past Surgical History:  Procedure Laterality Date   BONE BIOPSY  05/21/2024   Procedure: BIOPSY, GI;  Surgeon: Legrand Victory LITTIE DOUGLAS, MD;  Location: [facility] ENDOSCOPY;  Service: Gastroenterology;;   CATARACT EXTRACTION  2023   FLEXIBLE SIGMOIDOSCOPY N/A 05/21/2024   Procedure: KINGSTON SIDE;  Surgeon: Legrand Victory LITTIE DOUGLAS, MD;  Location: [facility] ENDOSCOPY;  Service: Gastroenterology;  Laterality: N/A;   LAPAROTOMY N/A 07/01/2024   Procedure: Exploratory laparotomy with [name] repair of perforated peptic ulcer;  Surgeon: Teresa Lonni HERO, MD;  Location: [facility] OR;  Service: General;  Laterality: N/A;    Family History  Problem Relation Age of Onset   Hearing loss Mother    Rheum arthritis Mother    Hypertension Brother    Hearing loss Brother    COPD Brother      Social History:  reports that she has never smoked. She has never used smokeless tobacco. She reports that she does not drink alcohol and does not use drugs.    Allergies  Allergen Reactions   Nsaids Other (See Comments)    Perforated peptic ulcer   Beef-Derived Drug Products Other (See Comments)    Vegetarian    Chicken Allergy Other (See Comments)    Vegetarian    Pork-Derived Products Other (See Comments)    vegetarian    Medications Prior to Admission  Medication Sig Dispense Refill  clobetasol cream (TEMOVATE) 0.05 % Apply 1 Application topically daily as needed (psoriasis).     Cyanocobalamin (VITAMIN B-12 PO) Take 1 capsule by mouth daily.     fluticasone (FLONASE) 50 MCG/ACT nasal spray Place 2 sprays into both nostrils daily as needed for allergies.     levothyroxine  (SYNTHROID ) 112 MCG tablet Take 112 mcg by mouth daily.     omeprazole  (PRILOSEC) 40 MG capsule Take 40 mg by mouth daily as needed (reflux).     [EXPIRED] predniSONE  (DELTASONE ) 10 MG tablet Take 4  tablets (40 mg total) by mouth daily for 10 days, THEN 3 tablets (30 mg total) daily for 10 days, THEN 2 tablets (20 mg total) daily for 10 days, THEN 1 tablet (10 mg total) daily for 10 days. (Patient taking differently: Take 20 mg by mouth in the morning and 20 mg by mouth at night ) 100 tablet 0   rosuvastatin  (CRESTOR ) 5 MG tablet Take 5 mg by mouth at bedtime.     Zinc  Oxide-Vitamin C  (ZINC  PLUS VITAMIN C  PO) Take 1 tablet by mouth daily.     mesalamine (APRISO) 0.375 g 24 hr capsule Take 1.5 g by mouth every morning. (Patient not taking: Reported on 07/02/2024)        Home: Home Living Family/patient expects to be discharged to:: Private residence Living Arrangements: Alone Available Help at Discharge: Family, Available 24 hours/day Type of Home: House Home Access: Stairs to enter [employer] of Steps: 4 Entrance Stairs-Rails: Right, Left Home Layout: One level Bathroom Shower/Tub: Engineer, manufacturing systems: Standard Bathroom Accessibility: Yes Home Equipment: Rollator (4 wheels), Cane - single point, Toilet riser, Tub bench, Grab bars - tub/shower   Functional History: Prior Function Prior Level of Function : Independent/Modified Independent, Driving, Working/employed Mobility Comments: Was IND without DME until last week or so when pain increased and pt began to use SPC vs rollator as needed ADLs Comments: works part time at [employer]  Functional Status:  Mobility: Bed Mobility Overal bed mobility: Needs Assistance Bed Mobility: Supine to Sit Rolling: Min assist Sidelying to sit: Mod assist Supine to sit: Mod assist General bed mobility comments: up in recliner and wanted to stay OOB Transfers Overall transfer level: Needs assistance Equipment used: Rolling walker (2 wheels) Transfers: Sit to/from Stand Sit to Stand: Mod assist, +2 physical assistance Bed to/from chair/wheelchair/BSC transfer type:: Step pivot Step pivot transfers: Min assist, Mod  assist General transfer comment: attempted +1 assist from chair with bil UEs pushing off armrests with hips cleared but unable to extend hips/knees fully; required +2 Ambulation/Gait Ambulation/Gait assistance: Min assist, +2 safety/equipment Gait Distance (Feet): 100 Feet Assistive device: Rolling walker (2 wheels) Gait Pattern/deviations: Step-through pattern, Decreased stride length, Narrow base of support, Trunk flexed General Gait Details: close chair follow for pt comfort/confidence;  cues for placement in RW (tends to get too close to the front), and increasing foot clearance and heel-toe gait Gait velocity: decr Gait velocity interpretation: <1.31 ft/sec, indicative of household ambulator    ADL: ADL Overall ADL's : Needs assistance/impaired Eating/Feeding: Set up, Sitting Grooming: Set up, Sitting Upper Body Bathing: Set up, Sitting Lower Body Bathing: Sit to/from stand, Maximal assistance Upper Body Dressing : Supervision/safety, Sitting Lower Body Dressing: Maximal assistance, Sit to/from stand Toilet Transfer: Moderate assistance, Tax adviser Details (indicate cue type and reason): max A for STS, once standing with RW pt is CGA Toileting- Clothing Manipulation and Hygiene: Sitting/lateral lean Functional mobility during ADLs: Maximal assistance, Rolling walker (  2 wheels) General ADL Comments: increased global weakness, required more assist for transfers. poor activity tolerance for ADLs and mobility. Dizziness noted with transition, BP stable, no nystagmus  Cognition: Cognition Orientation Level: Oriented X4 Cognition Arousal: Alert Behavior During Therapy: Flat affect  Physical Exam: Blood pressure 112/82, pulse 82, temperature (!) 97.5 F (36.4 C), temperature source Oral, resp. rate 18, height 5' 2 (1.575 m), weight 60.1 kg, SpO2 96%. Physical Exam  No results found for this or any previous visit (from the past 48 hours). No results  found.    Blood pressure 112/82, pulse 82, temperature (!) 97.5 F (36.4 C), temperature source Oral, resp. rate 18, height 5' 2 (1.575 m), weight 60.1 kg, SpO2 96%.  Medical Problem List and Plan: 1. Functional deficits secondary to ***  -patient may *** shower  -ELOS/Goals: *** 2.  Antithrombotics: -DVT/anticoagulation:  Mechanical: Sequential compression devices, below knee Bilateral lower extremities  -antiplatelet therapy: n/A 3. Pain Management:  ultram  prn for moderate to severe pain and oxycodone for breakthrough pain.  4. Mood/Behavior/Sleep: LCSW to follow for evaluation and support.   -antipsychotic agents: N/A 5. Neuropsych/cognition: This patient *** capable of making decisions on *** own behalf. 6. Skin/Wound Care: Air mattress for pressure relief measures. Stage 3 buttock wounds noted PTA.  --aquacel with dressing changes every 2-3 days per WOC.  7. Fluids/Electrolytes/Nutrition: Monitor I/O. Continue Kate Farms supplement TID w/vitamin  C and Zinc .  8.  Ulcerative rectosigmoid: Hydrocortisone  enemas and prednisone  20 mg bid  --Infliximab  7/25 9.  PUD s/p patch repair: On Protonix  BID and H pylori tx w/Flagyl , Pepto-bismol and tetracycline .   --monitor H/H every other day--> 8.5 at admission to 10.3 (?)-->9.3   10. Hyponatremia/Hypochloremia: Recheck in am 11. Malnutrition: Low phos/low normal Mg level. Recheck in am 12. GERD: PPI BID 13.  Iron  deficiency anemia due to chronic blood loss: Was on iron  infusions. Now on oral supplement.   --B 12 level >75000 (7/12) 14. Hypothyroid. Stable on  supplement.  15. Psoriasis: Managed with topicals PTA    ***  Sharlet GORMAN Schmitz, PA-C 07/12/2024

## 2024-07-12 NOTE — Progress Notes (Signed)
 11 Days Post-Op  Subjective: CC: No abdominal pain at rest. Tolerating diet without n/v. BM yesterday. Voiding. Up to the chair this morning.   Afebrile. No tachycardia or hypotension. WBC wnl on last check. Hgb stable on last check. Discussed with patient and RN about getting scheduled pepto for tx of her H. Pylori.   Objective: Vital signs in last 24 hours: Temp:  [97.5 F (36.4 C)-97.8 F (36.6 C)] 97.5 F (36.4 C) (07/22 0709) Pulse Rate:  [82-87] 82 (07/22 0709) Resp:  [17-20] 18 (07/22 0709) BP: (110-119)/(72-82) 112/82 (07/22 0709) SpO2:  [94 %-97 %] 96 % (07/22 0709) Last BM Date : 07/12/24  Intake/Output from previous day: 07/21 0701 - 07/22 0700 In: 360 [P.O.:360] Out: 900 [Urine:900] Intake/Output this shift: No intake/output data recorded.  PE: Gen:  Alert, NAD, pleasant Abd: Soft, no distension, NT. Midline wound with drain with staples cdi  Lab Results:  Recent Labs    07/10/24 0504  WBC 7.8  HGB 9.5*  HCT 30.3*  PLT 211   BMET Recent Labs    07/10/24 0504  NA 133*  K 4.4  CL 96*  CO2 29  GLUCOSE 150*  BUN 10  CREATININE 0.32*  CALCIUM  8.1*   PT/INR No results for input(s): LABPROT, INR in the last 72 hours. CMP     Component Value Date/Time   NA 133 (L) 07/10/2024 0504   K 4.4 07/10/2024 0504   CL 96 (L) 07/10/2024 0504   CO2 29 07/10/2024 0504   GLUCOSE 150 (H) 07/10/2024 0504   BUN 10 07/10/2024 0504   CREATININE 0.32 (L) 07/10/2024 0504   CALCIUM  8.1 (L) 07/10/2024 0504   PROT 4.3 (L) 07/02/2024 0629   ALBUMIN  1.9 (L) 07/02/2024 0629   AST 13 (L) 07/02/2024 0629   ALT 16 07/02/2024 0629   ALKPHOS 43 07/02/2024 0629   BILITOT 0.8 07/02/2024 0629   GFRNONAA >60 07/10/2024 0504   Lipase     Component Value Date/Time   LIPASE <10 (L) 08/02/2021 2201    Studies/Results: No results found.  Anti-infectives: Anti-infectives (From admission, onward)    Start     Dose/Rate Route Frequency Ordered Stop   07/07/24  1430  tetracycline  (SUMYCIN ) capsule 500 mg        500 mg Oral 4 times daily 07/07/24 1344     07/07/24 1400  metroNIDAZOLE  (FLAGYL ) tablet 500 mg        500 mg Oral Every 8 hours 07/07/24 1018 07/21/24 1359   07/07/24 1115  doxycycline  (VIBRA -TABS) tablet 100 mg  Status:  Discontinued        100 mg Oral Every 12 hours 07/07/24 1018 07/07/24 1344   07/02/24 1000  piperacillin -tazobactam (ZOSYN ) IVPB 3.375 g        3.375 g 12.5 mL/hr over 240 Minutes Intravenous Every 8 hours 07/02/24 0440 07/07/24 0104   07/02/24 0500  micafungin  (MYCAMINE ) 100 mg in sodium chloride  0.9 % 100 mL IVPB        100 mg 105 mL/hr over 1 Hours Intravenous Daily 07/02/24 0315 07/06/24 1123   07/02/24 0145  piperacillin -tazobactam (ZOSYN ) IVPB 3.375 g        3.375 g 100 mL/hr over 30 Minutes Intravenous  Once 07/02/24 0134 07/02/24 0354   07/01/24 2237  ceFAZolin  (ANCEF ) 2-4 GM/100ML-% IVPB       Note to Pharmacy: Roddie Grate: cabinet override      07/01/24 2237 07/02/24 1044  Assessment/Plan POD 10 s/p Exploratory laparotomy with [name] repair of perforated peptic ulcer by Dr. Teresa on 07/02/24 for Perforated pyloric channel peptic ulcer  - UGI POD3 negative - tolerating reg diet.  Drain removed postop day 7. - Completed zosyn /micafungin  - H pylori positive, discussed with pharmacist and will treat with additional 14d quadruple therapy. GI following.  - Cont BID PPI, added bismuth  - PRN IV pain meds. Avoid NSAIDs - Mobilize, PT - recs for CIR currently.  - Pulm toilet   FEN - Reg diet, SLIV VTE - SCDs, ok to resume Lovenox   ID - Zosyn , Micafungin  completed, now on tetracycline , flagyl , PPI, bismuth  for h pylori tx- 2 week course Foley - removed 7/13, voiding Dispo- CIR. They report they are doing peer to peer today. Medically stable for discharge to CIR.   Hx of UC on 40 mg prednisone  per day - was on solucortef, now on prednisone  20 mg BID. Dr. Josiah following. Plan to start  infliximab  next week.  Hypothyroidism - synthroid  PO UTI - 5 days zosyn  should be adequate treatment, UCx never sent  BRBPR - held LMWH, hgb stable on last check. 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

## 2024-07-12 NOTE — Progress Notes (Signed)
 Subjective: The banatrol exacerbated her diarrhea.  Prior to the fiber supplement she was having about 4 loose bowel movements.  Objective: Vital signs in last 24 hours: Temp:  [97.5 F (36.4 C)-97.8 F (36.6 C)] 97.5 F (36.4 C) (07/22 0709) Pulse Rate:  [82-87] 82 (07/22 0709) Resp:  [17-20] 18 (07/22 0709) BP: (110-119)/(72-82) 112/82 (07/22 0709) SpO2:  [94 %-97 %] 96 % (07/22 0709) Last BM Date : 07/12/24  Intake/Output from previous day: 07/21 0701 - 07/22 0700 In: 360 [P.O.:360] Out: 900 [Urine:900] Intake/Output this shift: No intake/output data recorded.  General appearance: alert and no distress GI: tender at the incision sites.  Lab Results: Recent Labs    07/10/24 0504  WBC 7.8  HGB 9.5*  HCT 30.3*  PLT 211   BMET Recent Labs    07/10/24 0504  NA 133*  K 4.4  CL 96*  CO2 29  GLUCOSE 150*  BUN 10  CREATININE 0.32*  CALCIUM  8.1*   LFT No results for input(s): PROT, ALBUMIN , AST, ALT, ALKPHOS, BILITOT, BILIDIR, IBILI in the last 72 hours. PT/INR No results for input(s): LABPROT, INR in the last 72 hours. Hepatitis Panel No results for input(s): HEPBSAG, HCVAB, HEPAIGM, HEPBIGM in the last 72 hours. C-Diff No results for input(s): CDIFFTOX in the last 72 hours. Fecal Lactopherrin No results for input(s): FECLLACTOFRN in the last 72 hours.  Studies/Results: No results found.  Medications: Scheduled:  sodium chloride    Intravenous Once   ascorbic acid   500 mg Oral BID   bismuth  subsalicylate  30 mL Oral TID AC & HS   feeding supplement (KATE FARMS STANDARD 1.4)  325 mL Oral TID BM   ferrous sulfate   325 mg Oral BID WC   fiber supplement (BANATROL TF)  60 mL Oral BID   hydrocortisone   100 mg Rectal QHS   levothyroxine   112 mcg Oral Daily   melatonin  3 mg Oral QHS   metroNIDAZOLE   500 mg Oral Q8H   multivitamin with minerals  1 tablet Oral Daily   pantoprazole  (PROTONIX ) IV  40 mg Intravenous Q12H    predniSONE   20 mg Oral BID WC   tetracycline   500 mg Oral QID   zinc  sulfate (50mg  elemental zinc )  220 mg Oral Daily   Continuous:  Assessment/Plan: 1) Pan UC. 2) S/p pyloric channel perforation. 3) Deconditioning.   The patient's symptoms worsened with the fiber supplement.  She does not report any hematochezia and her HGB is stable.  The patient was rejected from inpatient rehab, but an appeals process is in place.  Plan: 1) Infliximab  on 07/15/2024 - Hopefully she will still be here at [facility]. 2) No further banatrol fiber supplement. 3) Continue with physical therapy.  LOS: 10 days   Jacoby Zanni D 07/12/2024, 3:03 PM

## 2024-07-12 NOTE — Plan of Care (Signed)
  Problem: Education: Goal: Knowledge of General Education information will improve Description: Including pain rating scale, medication(s)/side effects and non-pharmacologic comfort measures Outcome: Progressing   Problem: Health Behavior/Discharge Planning: Goal: Ability to manage health-related needs will improve Outcome: Progressing   Problem: Clinical Measurements: Goal: Will remain free from infection Outcome: Progressing   Problem: Activity: Goal: Risk for activity intolerance will decrease Outcome: Progressing   Problem: Nutrition: Goal: Adequate nutrition will be maintained Outcome: Progressing   Problem: Elimination: Goal: Will not experience complications related to bowel motility Outcome: Progressing Goal: Will not experience complications related to urinary retention Outcome: Progressing   Problem: Pain Managment: Goal: General experience of comfort will improve and/or be controlled Outcome: Progressing   Problem: Skin Integrity: Goal: Risk for impaired skin integrity will decrease Outcome: Progressing

## 2024-07-13 NOTE — Progress Notes (Signed)
   Trauma/Critical Care Follow Up Note  Subjective:    Overnight Issues:   Objective:  Vital signs for last 24 hours: Temp:  [97.6 F (36.4 C)-98.1 F (36.7 C)] 97.6 F (36.4 C) (07/23 0726) Pulse Rate:  [80-90] 80 (07/23 0726) Resp:  [16-18] 18 (07/23 0726) BP: (102-122)/(64-90) 111/74 (07/23 0726) SpO2:  [94 %-95 %] 94 % (07/23 0726)  Hemodynamic parameters for last 24 hours:    Intake/Output from previous day: 07/22 0701 - 07/23 0700 In: 240 [P.O.:240] Out: 1400 [Urine:1400]  Intake/Output this shift: No intake/output data recorded.  Vent settings for last 24 hours:    Physical Exam:  Gen: comfortable, no distress Neuro: follows commands, alert, communicative HEENT: PERRL Neck: supple CV: RRR Pulm: unlabored breathing on RA Abd: soft, NT, incision clean, dry, intact , +BM GU: urine clear and yellow, +spontaneous voids Extr: wwp, no edema  No results found for this or any previous visit (from the past 24 hours).  Assessment & Plan:  Present on Admission:  Gastric perforation, acute    LOS: 11 days   Additional comments:I reviewed the patient's new clinical lab test results.   and I reviewed the patients new imaging test results.    POD 11 s/p Exploratory laparotomy with [name] repair of perforated peptic ulcer by Dr. Teresa on 07/02/24 for Perforated pyloric channel peptic ulcer  - UGI POD3 negative - tolerating reg diet.  Drain removed postop day 7. - Completed zosyn /micafungin  - H pylori positive, discussed with pharmacist and will treat with additional 14d quadruple therapy. GI following.  - Cont BID PPI, added bismuth  - PRN IV pain meds. Avoid NSAIDs - Mobilize, PT - recs for CIR currently.  - Pulm toilet - remove staples today   FEN - Reg diet, SLIV VTE - SCDs, ok to resume Lovenox   ID - Zosyn , Micafungin  completed, now on tetracycline , flagyl , PPI, bismuth  for h pylori tx- 2 week course Foley - removed 7/13, voiding Dispo- CIR. They  report they are doing peer to peer/appeal and it is pending. Medically stable for discharge to CIR.   Hx of UC on 40 mg prednisone  per day - was on solucortef, now on prednisone  20 mg BID. Dr. Josiah following. Plan to start infliximab  next week.  Hypothyroidism - synthroid  PO UTI - 5 days zosyn  should be adequate treatment, UCx never sent  BRBPR - held LMWH, hgb stable on last check. 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 I have reviewed this patient's available data, including medical history, events of note, physical examination and test results as part of my evaluation.

## 2024-07-13 NOTE — Progress Notes (Signed)
 Per order, staples removed from midline abdominal incision. Incision is clean, dry, and intact. Patient tolerated well.  Mliss Slight, RN

## 2024-07-13 NOTE — TOC Progression Note (Signed)
 Transition of Care [facility]) - Progression Note    Patient Details  Name: Allison Finley MRN: [id number] Date of Birth: 18-Mar-1951  Transition of Care [facility] CM/SW Contact  Nola Devere Hands, RN Phone Number: 07/13/2024, 1:29 PM  Clinical Narrative:    Case Manager received message that patient has been active with [facility].    Expected Discharge Plan: IP Rehab Facility Barriers to Discharge: Continued Medical Work up               Expected Discharge Plan and Services   Discharge Planning Services: CM Consult Post Acute Care Choice: IP Rehab Living arrangements for the past 2 months: Single Family Home                                       Social Drivers of Health (SDOH) Interventions SDOH Screenings   Food Insecurity: Unknown (07/02/2024)  Housing: Low Risk  (07/02/2024)  Transportation Needs: No Transportation Needs (07/02/2024)  Utilities: Not At Risk (07/02/2024)  Social Connections: Moderately Isolated (07/02/2024)  Tobacco Use: Low Risk  (07/01/2024)  Recent Concern: Tobacco Use - Medium Risk (06/06/2024)   Received from [facility]    Readmission Risk Interventions     No data to display

## 2024-07-13 NOTE — Progress Notes (Signed)
 Physical Therapy Treatment Patient Details Name: Allison Finley MRN: [id number] DOB: 07-18-1951 Today's Date: 07/13/2024   History of Present Illness Pt is a 73 y.o. female who presented 07/01/24 with severe abdominal pain. Pt found to have perforated pyloric channel peptic ulcer, s/p ex lap with repair 7/12. PMH: ulcerative colitis    PT Comments  Patient eager to work with PT. Up in chair on arrival. Performed LE exercises with bil LEs remaining very weak. She reports the weakness started a couple of weeks prior to admission, but has gotten worse while hospitalized. Continues to require +2 mod assist to come to stand with max cues for sequencing (trying to get her hips up and knees straight and then reach for the walker). Required assist to elevate/extend torso once hands on the RW. Able to ambulate with RW with increased reliance on UEs x 110 ft with min assist and close follow with chair for safety/confidence.    If plan is discharge home, recommend the following: A lot of help with bathing/dressing/bathroom;Assistance with cooking/housework;Assist for transportation;Help with stairs or ramp for entrance;Two people to help with walking and/or transfers   Can travel by private vehicle        Equipment Recommendations  None recommended by PT    Recommendations for Other Services       Precautions / Restrictions Precautions Precautions: Fall Recall of Precautions/Restrictions: Intact Precaution/Restrictions Comments: abdominal precautions Restrictions Weight Bearing Restrictions Per Provider Order: No     Mobility  Bed Mobility               General bed mobility comments: up in recliner and wanted to stay OOB    Transfers Overall transfer level: Needs assistance Equipment used: Rolling walker (2 wheels) Transfers: Sit to/from Stand Sit to Stand: Mod assist, +2 physical assistance           General transfer comment: attempted +1 assist from chair with bil  UEs pushing off armrests with hips cleared but unable to extend hips/knees fully; required +2    Ambulation/Gait Ambulation/Gait assistance: Min assist, +2 safety/equipment Gait Distance (Feet): 110 Feet Assistive device: Rolling walker (2 wheels) Gait Pattern/deviations: Step-through pattern, Decreased stride length, Narrow base of support, Trunk flexed Gait velocity: decr     General Gait Details: close chair follow for pt comfort/confidence;  cues for placement in RW (tends to get too close to the front), and increasing foot clearance and heel-toe gait   Stairs             Wheelchair Mobility     Tilt Bed    Modified Rankin (Stroke Patients Only)       Balance Overall balance assessment: Needs assistance Sitting-balance support: No upper extremity supported, Feet supported Sitting balance-Leahy Scale: Fair     Standing balance support: Reliant on assistive device for balance, Bilateral upper extremity supported Standing balance-Leahy Scale: Poor Standing balance comment: reliant on RW                            Communication Communication Communication: No apparent difficulties  Cognition Arousal: Alert Behavior During Therapy: Flat affect   PT - Cognitive impairments: Sequencing                       PT - Cognition Comments: anxiety lessening with mobility, benefits from encouragement and close chair follow when OOB to build confidence Following commands: Intact Following commands impaired: Follows multi-step commands  with increased time    Cueing Cueing Techniques: Verbal cues, Tactile cues  Exercises General Exercises - Lower Extremity Ankle Circles/Pumps: AROM, Both, 10 reps Long Arc Quad: AROM, Both, Seated, Strengthening, 10 reps (light manual resistance) Hip ABduction/ADduction: AROM, Both, 5 reps, Seated Hip Flexion/Marching: AROM, Both, 10 reps, Seated Heel Raises: AROM, Both, 10 reps, Seated    General Comments         Pertinent Vitals/Pain Pain Assessment Pain Assessment: Faces Faces Pain Scale: Hurts little more Pain Location: buttocks Pain Descriptors / Indicators: Sore Pain Intervention(s): Limited activity within patient's tolerance, Monitored during session    Home Living                          Prior Function            PT Goals (current goals can now be found in the care plan section) Acute Rehab PT Goals Patient Stated Goal: to return to being independent PT Goal Formulation: With patient Time For Goal Achievement: 07/16/24 Potential to Achieve Goals: Good Progress towards PT goals: Progressing toward goals    Frequency    Min 2X/week      PT Plan      Co-evaluation              AM-PAC PT 6 Clicks Mobility   Outcome Measure  Help needed turning from your back to your side while in a flat bed without using bedrails?: A Little Help needed moving from lying on your back to sitting on the side of a flat bed without using bedrails?: A Lot Help needed moving to and from a bed to a chair (including a wheelchair)?: Total Help needed standing up from a chair using your arms (e.g., wheelchair or bedside chair)?: Total Help needed to walk in hospital room?: Total Help needed climbing 3-5 steps with a railing? : Total 6 Click Score: 9    End of Session Equipment Utilized During Treatment: Gait belt Activity Tolerance: Patient tolerated treatment well Patient left: in chair;with call bell/phone within reach Nurse Communication: Mobility status PT Visit Diagnosis: Other abnormalities of gait and mobility (R26.89);Muscle weakness (generalized) (M62.81);Pain Pain - Right/Left:  (abdomen) Pain - part of body:  (abdomen)     Time: 9149-9079 PT Time Calculation (min) (ACUTE ONLY): 30 min  Charges:    $Gait Training: 8-22 mins $Therapeutic Exercise: 8-22 mins PT General Charges $$ ACUTE PT VISIT: 1 Visit                      Macario RAMAN, PT Acute  Rehabilitation Services  Office [phone]    [name] 07/13/2024, 10:34 AM

## 2024-07-13 NOTE — Progress Notes (Signed)
   Inpatient Rehabilitation Admissions Coordinator   I await [facility] Medicare appeal determination that was initiated on 7/22 for possible CIR admit.  Heron Leavell, RN, MSN Rehab Admissions Coordinator [phone] 07/13/2024 10:06 AM

## 2024-07-14 MED ORDER — ENOXAPARIN SODIUM 40 MG/0.4ML IJ SOSY
40.0000 mg | PREFILLED_SYRINGE | INTRAMUSCULAR | Status: DC
  Administered 2024-07-14 – 2024-07-18 (×5): 40 mg via SUBCUTANEOUS
  Filled 2024-07-14 (×5): qty 0.4

## 2024-07-14 NOTE — Discharge Instructions (Signed)
 [facility]      [facility], [place] [phone]  OPEN ABDOMINAL SURGERY: POST OP INSTRUCTIONS  Always review your discharge instruction sheet given to you by the facility where your surgery was performed.  IF YOU HAVE DISABILITY OR FAMILY LEAVE FORMS, YOU MUST BRING THEM TO THE OFFICE FOR PROCESSING.  PLEASE DO NOT GIVE THEM TO YOUR DOCTOR.  A prescription for pain medication may be given to you upon discharge.  Take your pain medication as prescribed, if needed.  If narcotic pain medicine is not needed, then you may take acetaminophen  (Tylenol ). Do not take NSAIDs like ibuprofen.  Take your usually prescribed medications unless otherwise directed. If you need a refill on your pain medication, please contact your pharmacy. They will contact our office to request authorization.  Prescriptions will not be filled after 5pm or on week-ends. You should follow a light diet the first few days after arrival home, such as soup and crackers, pudding, etc.unless your doctor has advised otherwise. A high-fiber, low fat diet can be resumed as tolerated.   Be sure to include lots of fluids daily. Most patients will experience some swelling and bruising on the chest and neck area.  Ice packs will help.  Swelling and bruising can take several days to resolve Most patients will experience some swelling and bruising in the area of the incision. Ice pack will help. Swelling and bruising can take several days to resolve..  It is common to experience some constipation if taking pain medication after surgery.  Increasing fluid intake and taking a stool softener will usually help or prevent this problem from occurring.  A mild laxative (Milk of Magnesia or Miralax) should be taken according to package directions if there are no bowel movements after 48 hours.  You may have steri-strips (small skin tapes) in place directly over the incision.  These strips should be left on the skin for 7-10 days.  If your surgeon used  skin glue on the incision, you may shower in 24 hours.  The glue will flake off over the next 2-3 weeks.  Any sutures or staples will be removed at the office during your follow-up visit. You may find that a light gauze bandage over your incision may keep your staples from being rubbed or pulled. You may shower and replace the bandage daily. ACTIVITIES:  You may resume regular (light) daily activities beginning the next day--such as daily self-care, walking, climbing stairs--gradually increasing activities as tolerated.  You may have sexual intercourse when it is comfortable.  Refrain from any heavy lifting or straining until approved by your doctor. You may drive when you no longer are taking prescription pain medication, you can comfortably wear a seatbelt, and you can safely maneuver your car and apply brakes Return to Work: ___________________________________ Allison Finley should see your doctor in the office for a follow-up appointment approximately two weeks after your surgery.  Make sure that you call for this appointment within a day or two after you arrive home to insure a convenient appointment time. OTHER INSTRUCTIONS:  _____________________________________________________________ _____________________________________________________________  WHEN TO CALL YOUR DOCTOR: Fever over 101.0 Inability to urinate Nausea and/or vomiting Extreme swelling or bruising Continued bleeding from incision. Increased pain, redness, or drainage from the incision. Difficulty swallowing or breathing Muscle cramping or spasms. Numbness or tingling in hands or feet or around lips.  The clinic staff is available to answer your questions during regular business hours.  Please don't hesitate to call and ask to speak to  one of the nurses if you have concerns.  For further questions, please visit [email or web address]

## 2024-07-14 NOTE — Progress Notes (Signed)
 Physical Therapy Treatment Patient Details Name: Allison Finley MRN: [id number] DOB: 12-Apr-1951 Today's Date: 07/14/2024   History of Present Illness Pt is a 73 y.o. female who presented 07/01/24 with severe abdominal pain. Pt found to have perforated pyloric channel peptic ulcer, s/p ex lap with repair 7/12. PMH: ulcerative colitis    PT Comments  Pt up in chair on arrival. Reporting incontinent episode and NT in to assist with cleaning and changing undergarment/Depends. Continues with very weak quads with +2 mod assist for sit to stand to RW. Once upright, able to ambulate with CGA x 100 ft with short step length, occasional foot flat landing, and occasionally getting too close to front of RW with tendency to then lean backwards.     If plan is discharge home, recommend the following: A lot of help with bathing/dressing/bathroom;Assistance with cooking/housework;Assist for transportation;Help with stairs or ramp for entrance;Two people to help with walking and/or transfers   Can travel by private vehicle        Equipment Recommendations  None recommended by PT    Recommendations for Other Services       Precautions / Restrictions Precautions Precautions: Fall Recall of Precautions/Restrictions: Intact Precaution/Restrictions Comments: abdominal precautions Restrictions Weight Bearing Restrictions Per Provider Order: No     Mobility  Bed Mobility                    Transfers Overall transfer level: Needs assistance Equipment used: Rolling walker (2 wheels) Transfers: Sit to/from Stand Sit to Stand: Mod assist, +2 physical assistance           General transfer comment: from recliner pushing from armrests; assist for elevating hips to extend knees    Ambulation/Gait Ambulation/Gait assistance: Min assist Gait Distance (Feet): 100 Feet Assistive device: Rolling walker (2 wheels) Gait Pattern/deviations: Step-through pattern, Decreased stride length,  Narrow base of support, Trunk flexed Gait velocity: decr     General Gait Details: min cues for placement in RW (tends to get too close to the front), and increasing foot clearance and heel-toe gait with good carryover   Stairs             Wheelchair Mobility     Tilt Bed    Modified Rankin (Stroke Patients Only)       Balance Overall balance assessment: Needs assistance Sitting-balance support: No upper extremity supported, Feet supported Sitting balance-Leahy Scale: Fair     Standing balance support: Reliant on assistive device for balance, Bilateral upper extremity supported Standing balance-Leahy Scale: Poor Standing balance comment: reliant on RW                            Communication Communication Communication: No apparent difficulties  Cognition Arousal: Alert Behavior During Therapy: WFL for tasks assessed/performed                             Following commands: Intact Following commands impaired: Follows multi-step commands with increased time    Cueing Cueing Techniques: Verbal cues, Tactile cues  Exercises General Exercises - Lower Extremity Ankle Circles/Pumps: AROM, Both, 10 reps    General Comments General comments (skin integrity, edema, etc.): Required brief changed prior to ambulation--NT in to assist.      Pertinent Vitals/Pain Pain Assessment Pain Assessment: Faces Faces Pain Scale: Hurts little more Pain Location: buttocks Pain Descriptors / Indicators: Sore Pain Intervention(s): Limited activity within  patient's tolerance, Monitored during session, Repositioned, Other (comment) (geomat in chair)    Home Living                          Prior Function            PT Goals (current goals can now be found in the care plan section) Acute Rehab PT Goals Patient Stated Goal: to return to being independent Time For Goal Achievement: 07/16/24 Potential to Achieve Goals: Good Progress towards PT  goals: Progressing toward goals    Frequency    Min 2X/week      PT Plan      Co-evaluation              AM-PAC PT 6 Clicks Mobility   Outcome Measure  Help needed turning from your back to your side while in a flat bed without using bedrails?: A Little Help needed moving from lying on your back to sitting on the side of a flat bed without using bedrails?: A Lot Help needed moving to and from a bed to a chair (including a wheelchair)?: Total Help needed standing up from a chair using your arms (e.g., wheelchair or bedside chair)?: Total Help needed to walk in hospital room?: Total Help needed climbing 3-5 steps with a railing? : Total 6 Click Score: 9    End of Session Equipment Utilized During Treatment: Gait belt Activity Tolerance: Patient tolerated treatment well Patient left: in chair;with call bell/phone within reach Nurse Communication: Mobility status PT Visit Diagnosis: Other abnormalities of gait and mobility (R26.89);Muscle weakness (generalized) (M62.81);Pain Pain - Right/Left:  (abdomen) Pain - part of body:  (abdomen)     Time: 8551-8497 PT Time Calculation (min) (ACUTE ONLY): 14 min  Charges:    $Gait Training: 8-22 mins PT General Charges $$ ACUTE PT VISIT: 1 Visit                      Allison Finley, PT Acute Rehabilitation Services  Office [phone]    Allison Allison Finley 07/14/2024, 3:09 PM

## 2024-07-14 NOTE — Progress Notes (Signed)
   Inpatient Rehabilitation Admissions Coordinator   I await insurance appeal determination from [facility] for possible CIR admit. I contacted appeals department this am and appeal remains in progress.  Heron Leavell, RN, MSN Rehab Admissions Coordinator [phone] 07/14/2024 10:21 AM

## 2024-07-14 NOTE — Progress Notes (Addendum)
 13 Days Post-Op  Subjective: CC: No abdominal pain at rest. Tolerating diet without n/v. BM yesterday that was non-bloody. Voiding. Up to the chair this morning.   Afebrile. No tachycardia or hypotension. No recent labs.   Objective: Vital signs in last 24 hours: Temp:  [97.6 F (36.4 C)-98 F (36.7 C)] 97.8 F (36.6 C) (07/24 0317) Pulse Rate:  [71-91] 71 (07/24 0317) Resp:  [17-19] 18 (07/24 0317) BP: (100-121)/(63-80) 121/80 (07/24 0730) SpO2:  [94 %-97 %] 96 % (07/23 2300) Last BM Date : 07/13/24  Intake/Output from previous day: 07/23 0701 - 07/24 0700 In: -  Out: 450 [Urine:450] Intake/Output this shift: No intake/output data recorded.  PE: Gen:  Alert, NAD, pleasant Heart: Reg Lungs: CTA b/l. Normal rate and effort Abd: Soft, no distension, NT. Midline wound cdi - staples have been removed Msk: MAE's  Lab Results:  No results for input(s): WBC, HGB, HCT, PLT in the last 72 hours.  BMET No results for input(s): NA, K, CL, CO2, GLUCOSE, BUN, CREATININE, CALCIUM  in the last 72 hours.  PT/INR No results for input(s): LABPROT, INR in the last 72 hours. CMP     Component Value Date/Time   NA 133 (L) 07/10/2024 0504   K 4.4 07/10/2024 0504   CL 96 (L) 07/10/2024 0504   CO2 29 07/10/2024 0504   GLUCOSE 150 (H) 07/10/2024 0504   BUN 10 07/10/2024 0504   CREATININE 0.32 (L) 07/10/2024 0504   CALCIUM  8.1 (L) 07/10/2024 0504   PROT 4.3 (L) 07/02/2024 0629   ALBUMIN  1.9 (L) 07/02/2024 0629   AST 13 (L) 07/02/2024 0629   ALT 16 07/02/2024 0629   ALKPHOS 43 07/02/2024 0629   BILITOT 0.8 07/02/2024 0629   GFRNONAA >60 07/10/2024 0504   Lipase     Component Value Date/Time   LIPASE <10 (L) 08/02/2021 2201    Studies/Results: No results found.  Anti-infectives: Anti-infectives (From admission, onward)    Start     Dose/Rate Route Frequency Ordered Stop   07/07/24 1430  tetracycline  (SUMYCIN ) capsule 500 mg        500 mg  Oral 4 times daily 07/07/24 1344     07/07/24 1400  metroNIDAZOLE  (FLAGYL ) tablet 500 mg        500 mg Oral Every 8 hours 07/07/24 1018 07/21/24 1359   07/07/24 1115  doxycycline  (VIBRA -TABS) tablet 100 mg  Status:  Discontinued        100 mg Oral Every 12 hours 07/07/24 1018 07/07/24 1344   07/02/24 1000  piperacillin -tazobactam (ZOSYN ) IVPB 3.375 g        3.375 g 12.5 mL/hr over 240 Minutes Intravenous Every 8 hours 07/02/24 0440 07/07/24 0104   07/02/24 0500  micafungin  (MYCAMINE ) 100 mg in sodium chloride  0.9 % 100 mL IVPB        100 mg 105 mL/hr over 1 Hours Intravenous Daily 07/02/24 0315 07/06/24 1123   07/02/24 0145  piperacillin -tazobactam (ZOSYN ) IVPB 3.375 g        3.375 g 100 mL/hr over 30 Minutes Intravenous  Once 07/02/24 0134 07/02/24 0354   07/01/24 2237  ceFAZolin  (ANCEF ) 2-4 GM/100ML-% IVPB       Note to Pharmacy: Roddie Grate: cabinet override      07/01/24 2237 07/02/24 1044        Assessment/Plan POD 12 s/p Exploratory laparotomy with [name] repair of perforated peptic ulcer by Dr. Teresa on 07/02/24 for Perforated pyloric channel peptic ulcer  - UGI  POD3 negative - tolerating reg diet.  Drain removed postop day 7. - Completed zosyn /micafungin  - H pylori positive, discussed with pharmacist and will treat with additional 14d quadruple therapy. GI following.  - Cont BID PPI, added bismuth  - PRN IV pain meds. Avoid NSAIDs - Staples removed POD 11 - Mobilize, PT - recs for CIR currently.  - Pulm toilet   FEN - Reg diet, SLIV VTE - SCDs, ok to resume Lovenox  - will check with pharmacy if okay to resume as allergy listed but patient had earlier in her hospital stay.  ID - Zosyn , Micafungin  completed, now on tetracycline , flagyl , PPI, bismuth  for h pylori tx- 2 week course Foley - removed 7/13, voiding Dispo- CIR. Peer to peer pending. Medically stable for discharge to CIR. Can move to med-surg if needed.   Hx of UC on 40 mg prednisone  per day - was on  solucortef, now on prednisone  20 mg BID. Dr. Josiah following. Plan to start infliximab  7/25 per GI notes, defer to their team Hypothyroidism - synthroid  PO UTI - 5 days zosyn  should be adequate treatment, UCx never sent  BRBPR - held LMWH, hgb stable on last check. Resolved. 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

## 2024-07-14 NOTE — Progress Notes (Signed)
  Inpatient Rehabilitation Admissions Coordinator   I have received a denial on the appeal with [facility] for CIR admit.  I met with patient at bedside and called her daughter, Mliss, by phone. Patient asking to discharge home and not SNF. I explained to daughter that no one is recommending home alone and daughter states she is unable to provide 24/7 assist. Mliss to discuss with her Mom and would like to speak to [facility] about possible SNF. I will alert TOC . We will sign off.  Heron Leavell, RN, MSN Rehab Admissions Coordinator [phone] 07/14/2024 5:00 PM

## 2024-07-14 NOTE — Progress Notes (Signed)
 Occupational Therapy Treatment Patient Details Name: Allison Finley MRN: [id number] DOB: May 29, 1951 Today's Date: 07/14/2024   History of present illness Pt is a 73 y.o. female who presented 07/01/24 with severe abdominal pain. Pt found to have perforated pyloric channel peptic ulcer, s/p ex lap with repair 7/12. PMH: ulcerative colitis   OT comments  Patient continues to demonstrate slow but improved progress toward patient focused goals.  Lower extremity and core weakness continue to be the primary deficits.  Given patient's Mod A for ADL and mobility, AIR has been recommended for post acute rehab.  Patient demonstrates the willingness and ability to participate with a higher level of rehab needed, to help her achieve a Mod I level prior to returning home.  OT will continue efforts in the acute setting.        If plan is discharge home, recommend the following:  Assist for transportation;Assistance with cooking/housework;A lot of help with bathing/dressing/bathroom;A little help with walking and/or transfers   Equipment Recommendations  None recommended by OT    Recommendations for Other Services      Precautions / Restrictions Precautions Precautions: Fall Recall of Precautions/Restrictions: Intact Precaution/Restrictions Comments: abdominal precautions Restrictions Weight Bearing Restrictions Per Provider Order: No       Mobility Bed Mobility Overal bed mobility: Needs Assistance Bed Mobility: Supine to Sit     Supine to sit: Mod assist          Transfers Overall transfer level: Needs assistance Equipment used: Rolling walker (2 wheels) Transfers: Sit to/from Stand, Bed to chair/wheelchair/BSC Sit to Stand: Mod assist     Step pivot transfers: Min assist           Balance Overall balance assessment: Needs assistance Sitting-balance support: No upper extremity supported, Feet supported Sitting balance-Leahy Scale: Fair     Standing balance  support: Reliant on assistive device for balance, Bilateral upper extremity supported Standing balance-Leahy Scale: Poor                             ADL either performed or assessed with clinical judgement   ADL       Grooming: Set up;Sitting       Lower Body Bathing: Maximal assistance;Sit to/from stand Lower Body Bathing Details (indicate cue type and reason): unable to achieve standing without Mod A, and unable to release RW for pant management/hygiene Upper Body Dressing : Supervision/safety;Sitting   Lower Body Dressing: Maximal assistance;Sit to/from stand Lower Body Dressing Details (indicate cue type and reason): unable to achieve standing without Mod A, and unable to release RW for pant management/hygiene Toilet Transfer: Moderate assistance;Regular Toilet;Ambulation   Toileting- Clothing Manipulation and Hygiene: Sitting/lateral lean;Contact guard assist              Extremity/Trunk Assessment Upper Extremity Assessment Upper Extremity Assessment: Generalized weakness   Lower Extremity Assessment Lower Extremity Assessment: Defer to PT evaluation   Cervical / Trunk Assessment Cervical / Trunk Assessment: Other exceptions Cervical / Trunk Exceptions: abdominal incisions    Vision Patient Visual Report: No change from baseline     Perception Perception Perception: Not tested   Praxis Praxis Praxis: Not tested   Communication Communication Communication: No apparent difficulties   Cognition Arousal: Alert Behavior During Therapy: WFL for tasks assessed/performed Cognition: No apparent impairments  Following commands: Intact        Cueing   Cueing Techniques: Verbal cues, Tactile cues  Exercises      Shoulder Instructions       General Comments      Pertinent Vitals/ Pain       Pain Assessment Pain Assessment: No/denies pain Pain Intervention(s): Monitored during session                                                           Frequency  Min 2X/week        Progress Toward Goals  OT Goals(current goals can now be found in the care plan section)  Progress towards OT goals: Progressing toward goals  Acute Rehab OT Goals OT Goal Formulation: With patient Time For Goal Achievement: 07/18/24 Potential to Achieve Goals: Good  Plan      Co-evaluation                 AM-PAC OT 6 Clicks Daily Activity     Outcome Measure   Help from another person eating meals?: None Help from another person taking care of personal grooming?: A Little Help from another person toileting, which includes using toliet, bedpan, or urinal?: A Lot Help from another person bathing (including washing, rinsing, drying)?: A Lot Help from another person to put on and taking off regular upper body clothing?: A Little Help from another person to put on and taking off regular lower body clothing?: A Lot 6 Click Score: 16    End of Session Equipment Utilized During Treatment: Rolling walker (2 wheels)  OT Visit Diagnosis: Unsteadiness on feet (R26.81);Muscle weakness (generalized) (M62.81)   Activity Tolerance Patient tolerated treatment well   Patient Left in chair;with call bell/phone within reach   Nurse Communication Mobility status        Time: 9146-9087 OT Time Calculation (min): 19 min  Charges: OT General Charges $OT Visit: 1 Visit OT Treatments $Self Care/Home Management : 8-22 mins  07/14/2024  RP, OTR/L  Acute Rehabilitation Services  Office:  [phone]   Charlie JONETTA Halsted 07/14/2024, 9:23 AM

## 2024-07-14 NOTE — Progress Notes (Signed)
 Subjective: Minor diarrhea over the past 24 hours.  Objective: Vital signs in last 24 hours: Temp:  [97.7 F (36.5 C)-98 F (36.7 C)] 97.8 F (36.6 C) (07/24 0317) Pulse Rate:  [71-91] 71 (07/24 0317) Resp:  [17-18] 18 (07/24 0317) BP: (100-121)/(63-80) 121/80 (07/24 0730) SpO2:  [96 %-97 %] 96 % (07/23 2300) Last BM Date : 07/13/24  Intake/Output from previous day: 07/23 0701 - 07/24 0700 In: -  Out: 450 [Urine:450] Intake/Output this shift: No intake/output data recorded.  General appearance: alert and no distress GI: mild abdominal tenderness  Lab Results: No results for input(s): WBC, HGB, HCT, PLT in the last 72 hours. BMET No results for input(s): NA, K, CL, CO2, GLUCOSE, BUN, CREATININE, CALCIUM  in the last 72 hours. LFT No results for input(s): PROT, ALBUMIN , AST, ALT, ALKPHOS, BILITOT, BILIDIR, IBILI in the last 72 hours. PT/INR No results for input(s): LABPROT, INR in the last 72 hours. Hepatitis Panel No results for input(s): HEPBSAG, HCVAB, HEPAIGM, HEPBIGM in the last 72 hours. C-Diff No results for input(s): CDIFFTOX in the last 72 hours. Fecal Lactopherrin No results for input(s): FECLLACTOFRN in the last 72 hours.  Studies/Results: No results found.  Medications: Scheduled:  sodium chloride    Intravenous Once   ascorbic acid   500 mg Oral BID   bismuth  subsalicylate  30 mL Oral TID AC & HS   enoxaparin  (LOVENOX ) injection  40 mg Subcutaneous Q24H   feeding supplement (KATE FARMS STANDARD 1.4)  325 mL Oral TID BM   ferrous sulfate   325 mg Oral BID WC   hydrocortisone   100 mg Rectal QHS   levothyroxine   112 mcg Oral Daily   melatonin  3 mg Oral QHS   metroNIDAZOLE   500 mg Oral Q8H   multivitamin with minerals  1 tablet Oral Daily   pantoprazole  (PROTONIX ) IV  40 mg Intravenous Q12H   predniSONE   20 mg Oral BID WC   tetracycline   500 mg Oral QID   zinc  sulfate (50mg  elemental zinc )  220 mg  Oral Daily   Continuous:  Assessment/Plan: 1) Pan UC. 2) Deconditioning. 3) S/p pyloric channel perforation.   She is pending transfer to inpatient rehab.  She is clinically stable.  The plan is to start her on infliximab  tomorrow, which will be two weeks from the surgery.   Plan: 1) Start infliximab  tomorrow. 2) Continue with oral prednisone . 3) Continue with hydrocortisone  enemas.  LOS: 12 days   Elzena Muston D 07/14/2024, 11:59 AM

## 2024-07-15 MED ORDER — SODIUM CHLORIDE 0.9 % IV SOLN
5.0000 mg/kg | Freq: Once | INTRAVENOUS | Status: AC
  Administered 2024-07-15: 300 mg via INTRAVENOUS
  Filled 2024-07-15: qty 30

## 2024-07-15 MED ORDER — SODIUM CHLORIDE 0.9% FLUSH
10.0000 mL | Freq: Two times a day (BID) | INTRAVENOUS | Status: DC
  Administered 2024-07-15 – 2024-07-18 (×6): 10 mL

## 2024-07-15 MED ORDER — SODIUM CHLORIDE 0.9% FLUSH: 10.0000 mL | INTRAVENOUS | Status: DC | PRN

## 2024-07-15 NOTE — Care Management (Signed)
 Call from CM office, The patients  daughter called them to ask for the social worker to call her as well, she was interested in [facility].

## 2024-07-15 NOTE — TOC Progression Note (Addendum)
 Transition of Care [facility]) - Progression Note    Patient Details  Name: Allison Finley MRN: [id number] Date of Birth: [birth year]  [facility] CM/SW Contact  Gwenn Frieze [place], [place] Phone Number: 07/15/2024, 12:25 PM  Clinical Narrative: Spoke to pt's dtr Allison Finley [employer] denial for CIR and recommendation for SNF. Reviewed SNF placement process and answered questions. Dtr agreeable to SNF for STR and requesting [facility]. Will f/u with offers as available.   UPDATE 1350: Spoke to Allison Finley with [facility] who reports they need to confirm with their dietary staff they can meet pt's dietary preference of pescatarian. Updated pt's dtr who again voices strong preference for [facility] . Will provide updates as available.   UPDATE 1425: Message received from Impact with [facility] who confirmed they are able to offer a bed beginning Monday. Updated pt's dtr and she has accepted the bed offer. Will start auth over weekend.   Frieze Gwenn, MSW, LCSW [phone] (coverage)        Expected Discharge Plan: IP Rehab Facility Barriers to Discharge: Continued Medical Work up               Expected Discharge Plan and Services   Discharge Planning Services: CM Consult Post Acute Care Choice: IP Rehab Living arrangements for the past 2 months: Single Family Home                                       Social Drivers of Health (SDOH) Interventions SDOH Screenings   Food Insecurity: Unknown (07/02/2024)  Housing: Low Risk  (07/02/2024)  Transportation Needs: No Transportation Needs (07/02/2024)  Utilities: Not At Risk (07/02/2024)  Social Connections: Moderately Isolated (07/02/2024)  Tobacco Use: Low Risk  (07/01/2024)  Recent Concern: Tobacco Use - Medium Risk (06/06/2024)   Received from [facility]    Readmission Risk Interventions     No data to display

## 2024-07-15 NOTE — Progress Notes (Signed)
 14 Days Post-Op  Subjective: CC: No abdominal pain currently except a twinge at the top of her incision. Tolerating diet without n/v. BM yesterday that was non-bloody. Voiding. Up to the chair this morning.   Afebrile. HR 101. Last BP 98/55. No recent labs.   Objective: Vital signs in last 24 hours: Temp:  [97.5 F (36.4 C)-98 F (36.7 C)] 97.9 F (36.6 C) (07/25 0751) Pulse Rate:  [70-101] 101 (07/25 0751) Resp:  [17-20] 20 (07/25 0751) BP: (98-112)/(55-82) 98/55 (07/25 0751) SpO2:  [95 %-99 %] 99 % (07/25 0751) Last BM Date : 07/14/24  Intake/Output from previous day: 07/24 0701 - 07/25 0700 In: 120 [P.O.:120] Out: 700 [Urine:700] Intake/Output this shift: No intake/output data recorded.  PE: Gen:  Alert, NAD, pleasant Heart: Tachycardic  Lungs: CTA b/l. Normal rate and effort Abd: Soft, no distension, NT. Midline wound cdi - staples have been removed Msk: MAE's  Lab Results:  No results for input(s): WBC, HGB, HCT, PLT in the last 72 hours.  BMET No results for input(s): NA, K, CL, CO2, GLUCOSE, BUN, CREATININE, CALCIUM  in the last 72 hours.  PT/INR No results for input(s): LABPROT, INR in the last 72 hours. CMP     Component Value Date/Time   NA 133 (L) 07/10/2024 0504   K 4.4 07/10/2024 0504   CL 96 (L) 07/10/2024 0504   CO2 29 07/10/2024 0504   GLUCOSE 150 (H) 07/10/2024 0504   BUN 10 07/10/2024 0504   CREATININE 0.32 (L) 07/10/2024 0504   CALCIUM  8.1 (L) 07/10/2024 0504   PROT 4.3 (L) 07/02/2024 0629   ALBUMIN  1.9 (L) 07/02/2024 0629   AST 13 (L) 07/02/2024 0629   ALT 16 07/02/2024 0629   ALKPHOS 43 07/02/2024 0629   BILITOT 0.8 07/02/2024 0629   GFRNONAA >60 07/10/2024 0504   Lipase     Component Value Date/Time   LIPASE <10 (L) 08/02/2021 2201    Studies/Results: No results found.  Anti-infectives: Anti-infectives (From admission, onward)    Start     Dose/Rate Route Frequency Ordered Stop   07/07/24  1430  tetracycline  (SUMYCIN ) capsule 500 mg        500 mg Oral 4 times daily 07/07/24 1344     07/07/24 1400  metroNIDAZOLE  (FLAGYL ) tablet 500 mg        500 mg Oral Every 8 hours 07/07/24 1018 07/21/24 1359   07/07/24 1115  doxycycline  (VIBRA -TABS) tablet 100 mg  Status:  Discontinued        100 mg Oral Every 12 hours 07/07/24 1018 07/07/24 1344   07/02/24 1000  piperacillin -tazobactam (ZOSYN ) IVPB 3.375 g        3.375 g 12.5 mL/hr over 240 Minutes Intravenous Every 8 hours 07/02/24 0440 07/07/24 0104   07/02/24 0500  micafungin  (MYCAMINE ) 100 mg in sodium chloride  0.9 % 100 mL IVPB        100 mg 105 mL/hr over 1 Hours Intravenous Daily 07/02/24 0315 07/06/24 1123   07/02/24 0145  piperacillin -tazobactam (ZOSYN ) IVPB 3.375 g        3.375 g 100 mL/hr over 30 Minutes Intravenous  Once 07/02/24 0134 07/02/24 0354   07/01/24 2237  ceFAZolin  (ANCEF ) 2-4 GM/100ML-% IVPB       Note to Pharmacy: Roddie Grate: cabinet override      07/01/24 2237 07/02/24 1044        Assessment/Plan POD 13 s/p Exploratory laparotomy with [name] repair of perforated peptic ulcer by Dr. Teresa on  07/02/24 for Perforated pyloric channel peptic ulcer  - UGI POD3 negative - tolerating reg diet.  Drain removed postop day 7. - Completed zosyn /micafungin  - H pylori positive, discussed with pharmacist and will treat with additional 14d quadruple therapy. GI following.  - Cont BID PPI, added bismuth  - PRN IV pain meds. Avoid NSAIDs - Staples removed POD 11 - Mobilize, PT - recs for CIR currently.  - Pulm toilet - If worsening tachycardia or any hypotension, recheck labs. Can hold currently. Will have RN recheck vitals.    FEN - Reg diet, SLIV VTE - SCDs, Lovenox  ID - Zosyn , Micafungin  completed, now on tetracycline , flagyl , PPI, bismuth  for h pylori tx- 2 week course Foley - removed 7/13, voiding Dispo- Peer to peer for CIR declined. Patient wishes for SNF. TOC consult.    Hx of UC on 40 mg prednisone   per day - was on solucortef, now on prednisone  20 mg BID. Dr. Josiah following. They started infliximab  7/25 Hypothyroidism - synthroid  PO UTI - 5 days zosyn  should be adequate treatment, UCx never sent  BRBPR - held LMWH, hgb stable on last check. Resolved. 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

## 2024-07-15 NOTE — Progress Notes (Addendum)
 Infliximab  consult   5mg /kg IV, give 300mg  IV x1  Monitor for s/sx of infusion reaction  Umberto Pavek, PharmD PGY-1 Pharmacy Resident [facility]  07/15/2024 8:58 AM

## 2024-07-15 NOTE — NC FL2 (Signed)
 [place]  MEDICAID FL2 LEVEL OF CARE FORM     IDENTIFICATION  Patient Name: Allison Finley Birthdate: [birth year] Sex: female Admission Date (Current Location): 07/01/2024  [facility] and [place] Number:  Producer, television/film/video and Address:  [facility] [place], [address] [place], [place] [address]      Provider Number: [id number]  Attending Physician Name and Address:  Kristopher, Md, MD  Relative Name and Phone Number:       Current Level of Care: Hospital Recommended Level of Care: Skilled Nursing Facility Prior Approval Number:    Date Approved/Denied:   PASRR Number: [id number]  Discharge Plan: SNF    Current Diagnoses: Patient Active Problem List   Diagnosis Date Noted   Chronic pancolonic ulcerative colitis (HCC) 07/09/2024   H. pylori infection 07/09/2024   Protein-calorie malnutrition, severe 07/05/2024   Gastric perforation, acute 07/02/2024   Iron  deficiency anemia due to chronic blood loss 05/22/2024   Cystocele, unspecified 05/22/2024   Inflammatory bowel disease (ulcerative colitis) (HCC) 05/20/2024   Hematochezia 05/19/2024    Orientation RESPIRATION BLADDER Height & Weight     Self, Time, Situation, Place  Normal Continent Weight: 132 lb 7.9 oz (60.1 kg) Height:  5' 2 (157.5 cm)  BEHAVIORAL SYMPTOMS/MOOD NEUROLOGICAL BOWEL NUTRITION STATUS      Continent    AMBULATORY STATUS COMMUNICATION OF NEEDS Skin   Limited Assist Verbally Surgical wounds                       Personal Care Assistance Level of Assistance  Bathing, Feeding, Dressing Bathing Assistance: Limited assistance Feeding assistance: Limited assistance       Functional Limitations Info  Sight, Hearing, Speech Sight Info: Adequate Hearing Info: Adequate Speech Info: Adequate    SPECIAL CARE FACTORS FREQUENCY  PT (By licensed PT), OT (By licensed OT)                    Contractures Contractures Info: Not present    Additional Factors  Info                  Current Medications (07/15/2024):  This is the current hospital active medication list Current Facility-Administered Medications  Medication Dose Route Frequency Provider Last Rate Last Admin   0.9 %  sodium chloride  infusion (Manually program via Guardrails IV Fluids)   Intravenous Once Golob, Jamie C., CRNA       acetaminophen  (TYLENOL ) tablet 1,000 mg  1,000 mg Oral Q6H PRN Maczis, Michael M, PA-C       ascorbic acid  (VITAMIN C ) tablet 500 mg  500 mg Oral BID Johnson, Kelly R, PA-C   500 mg at 07/15/24 0802   bismuth  subsalicylate (PEPTO BISMOL) 262 MG/15ML suspension 30 mL  30 mL Oral TID AC & HS Johnson, Kelly R, PA-C   30 mL at 07/15/24 9340   diphenhydrAMINE  (BENADRYL ) 12.5 MG/5ML elixir 12.5 mg  12.5 mg Oral Q6H PRN Teresa Lonni HERO, MD       enoxaparin  (LOVENOX ) injection 40 mg  40 mg Subcutaneous Q24H Maczis, Michael M, PA-C   40 mg at 07/15/24 0802   feeding supplement (KATE FARMS STANDARD 1.4) liquid 325 mL  325 mL Oral TID BM Johnson, Kelly R, PA-C   325 mL at 07/15/24 0802   ferrous sulfate  tablet 325 mg  325 mg Oral BID WC Johnson, Kelly R, PA-C   325 mg at 07/15/24 0802   hydrocortisone  (CORTENEMA ) enema  100 mg  100 mg Rectal QHS Suzann Inocente HERO, MD   100 mg at 07/14/24 2138   HYDROmorphone  (DILAUDID ) injection 0.5 mg  0.5 mg Intravenous Q2H PRN Vicci Burnard SAUNDERS, PA-C   0.5 mg at 07/03/24 1432   inFLIXimab  (REMICADE ) 5 mg/kg = 300 mg in sodium chloride  0.9 % 250 mL infusion  5 mg/kg Intravenous Once Rollin Dover, MD 150 mL/hr at 07/15/24 1218 Rate Change at 07/15/24 1218   levothyroxine  (SYNTHROID ) tablet 112 mcg  112 mcg Oral Daily Odell Celinda Balo, MD   112 mcg at 07/15/24 0544   melatonin tablet 3 mg  3 mg Oral QHS Odell Celinda Balo, MD   3 mg at 07/14/24 2136   metoprolol  tartrate (LOPRESSOR ) injection 5 mg  5 mg Intravenous Q6H PRN Teresa Lonni HERO, MD       metroNIDAZOLE  (FLAGYL ) tablet 500 mg  500 mg Oral Q8H Johnson, Kelly R, PA-C    500 mg at 07/15/24 0543   multivitamin with minerals tablet 1 tablet  1 tablet Oral Daily Lovick, Ayesha N, MD   1 tablet at 07/15/24 0802   ondansetron  (ZOFRAN -ODT) disintegrating tablet 4 mg  4 mg Oral Q6H PRN Teresa Lonni HERO, MD       Or   ondansetron  (ZOFRAN ) injection 4 mg  4 mg Intravenous Q6H PRN Teresa Lonni HERO, MD       pantoprazole  (PROTONIX ) injection 40 mg  40 mg Intravenous Q12H Teresa Lonni HERO, MD   40 mg at 07/15/24 0802   predniSONE  (DELTASONE ) tablet 20 mg  20 mg Oral BID WC Odell Celinda Balo, MD   20 mg at 07/15/24 0802   tetracycline  (SUMYCIN ) capsule 500 mg  500 mg Oral QID Rollin Dover, MD   500 mg at 07/15/24 0802   traMADol  (ULTRAM ) tablet 50-100 mg  50-100 mg Oral Q6H PRN Johnson, Kelly R, PA-C       zinc  sulfate (50mg  elemental zinc ) capsule 220 mg  220 mg Oral Daily Paola Dreama SAILOR, MD   220 mg at 07/15/24 0802     Discharge Medications: Please see discharge summary for a list of discharge medications.  Relevant Imaging Results:  Relevant Lab Results:   Additional Information SS# [id number]  Gwenn Julien Norris, [place]

## 2024-07-15 NOTE — Progress Notes (Signed)

## 2024-07-16 MED ORDER — PANTOPRAZOLE SODIUM 40 MG PO TBEC
40.0000 mg | DELAYED_RELEASE_TABLET | Freq: Two times a day (BID) | ORAL | Status: DC
  Administered 2024-07-16 – 2024-07-18 (×4): 40 mg via ORAL
  Filled 2024-07-16 (×4): qty 1

## 2024-07-16 NOTE — Progress Notes (Signed)
 Subjective: She had an uncomplicated infusion of infliximab  yesterday.  The patient reports an improvement.  Objective: Vital signs in last 24 hours: Temp:  [97.7 F (36.5 C)-98.3 F (36.8 C)] 97.7 F (36.5 C) (07/26 0722) Pulse Rate:  [80-94] 87 (07/26 0722) Resp:  [16-20] 16 (07/26 0722) BP: (101-115)/(58-82) 101/58 (07/26 0722) SpO2:  [93 %-97 %] 95 % (07/26 0722) Last BM Date : 07/15/24  Intake/Output from previous day: 07/25 0701 - 07/26 0700 In: 360 [P.O.:360] Out: 950 [Urine:950] Intake/Output this shift: No intake/output data recorded.  General appearance: alert and no distress GI: soft, non-tender; bowel sounds normal; no masses,  no organomegaly  Lab Results: No results for input(s): WBC, HGB, HCT, PLT in the last 72 hours. BMET No results for input(s): NA, K, CL, CO2, GLUCOSE, BUN, CREATININE, CALCIUM  in the last 72 hours. LFT No results for input(s): PROT, ALBUMIN , AST, ALT, ALKPHOS, BILITOT, BILIDIR, IBILI in the last 72 hours. PT/INR No results for input(s): LABPROT, INR in the last 72 hours. Hepatitis Panel No results for input(s): HEPBSAG, HCVAB, HEPAIGM, HEPBIGM in the last 72 hours. C-Diff No results for input(s): CDIFFTOX in the last 72 hours. Fecal Lactopherrin No results for input(s): FECLLACTOFRN in the last 72 hours.  Studies/Results: No results found.  Medications: Scheduled:  sodium chloride    Intravenous Once   ascorbic acid   500 mg Oral BID   bismuth  subsalicylate  30 mL Oral TID AC & HS   enoxaparin  (LOVENOX ) injection  40 mg Subcutaneous Q24H   feeding supplement (KATE FARMS STANDARD 1.4)  325 mL Oral TID BM   ferrous sulfate   325 mg Oral BID WC   hydrocortisone   100 mg Rectal QHS   levothyroxine   112 mcg Oral Daily   melatonin  3 mg Oral QHS   metroNIDAZOLE   500 mg Oral Q8H   multivitamin with minerals  1 tablet Oral Daily   pantoprazole  (PROTONIX ) IV  40 mg Intravenous Q12H    predniSONE   20 mg Oral BID WC   sodium chloride  flush  10-40 mL Intracatheter Q12H   tetracycline   500 mg Oral QID   zinc  sulfate (50mg  elemental zinc )  220 mg Oral Daily   Continuous:  Assessment/Plan: 1) Pan UC s/p first infusion of infliximab . 2) S/p gastric ulcer with perforation.   She is clinically improved with the infliximab .  She notices that her stools are firming up.  Plan: 1) Arrangements will be made for out patient infliximab . 2) Okay with SNF. 3) Signing off.  LOS: 14 days   Onnika Siebel D 07/16/2024, 8:32 AM

## 2024-07-16 NOTE — Progress Notes (Signed)
 15 Days Post-Op  Subjective: Feels better after infliximab  infusion yesterday.  Feels BMs are thicker.  No other complaints  Objective: Vital signs in last 24 hours: Temp:  [97.7 F (36.5 C)-98.3 F (36.8 C)] 97.7 F (36.5 C) (07/26 0722) Pulse Rate:  [80-94] 87 (07/26 0722) Resp:  [16-20] 16 (07/26 0722) BP: (101-115)/(58-82) 101/58 (07/26 0722) SpO2:  [93 %-97 %] 95 % (07/26 0722) Last BM Date : 07/15/24  Intake/Output from previous day: 07/25 0701 - 07/26 0700 In: 360 [P.O.:360] Out: 950 [Urine:950] Intake/Output this shift: No intake/output data recorded.  PE: Gen:  Alert, NAD, pleasant Heart: regular Lungs: CTA b/l. Normal rate and effort Abd: Soft, no distension, NT. Midline wound cdi - staples have been removed Msk: MAE's  Lab Results:  No results for input(s): WBC, HGB, HCT, PLT in the last 72 hours.  BMET No results for input(s): NA, K, CL, CO2, GLUCOSE, BUN, CREATININE, CALCIUM  in the last 72 hours.  PT/INR No results for input(s): LABPROT, INR in the last 72 hours. CMP     Component Value Date/Time   NA 133 (L) 07/10/2024 0504   K 4.4 07/10/2024 0504   CL 96 (L) 07/10/2024 0504   CO2 29 07/10/2024 0504   GLUCOSE 150 (H) 07/10/2024 0504   BUN 10 07/10/2024 0504   CREATININE 0.32 (L) 07/10/2024 0504   CALCIUM  8.1 (L) 07/10/2024 0504   PROT 4.3 (L) 07/02/2024 0629   ALBUMIN  1.9 (L) 07/02/2024 0629   AST 13 (L) 07/02/2024 0629   ALT 16 07/02/2024 0629   ALKPHOS 43 07/02/2024 0629   BILITOT 0.8 07/02/2024 0629   GFRNONAA >60 07/10/2024 0504   Lipase     Component Value Date/Time   LIPASE <10 (L) 08/02/2021 2201    Studies/Results: No results found.  Anti-infectives: Anti-infectives (From admission, onward)    Start     Dose/Rate Route Frequency Ordered Stop   07/07/24 1430  tetracycline  (SUMYCIN ) capsule 500 mg        500 mg Oral 4 times daily 07/07/24 1344     07/07/24 1400  metroNIDAZOLE  (FLAGYL ) tablet  500 mg        500 mg Oral Every 8 hours 07/07/24 1018 07/21/24 1359   07/07/24 1115  doxycycline  (VIBRA -TABS) tablet 100 mg  Status:  Discontinued        100 mg Oral Every 12 hours 07/07/24 1018 07/07/24 1344   07/02/24 1000  piperacillin -tazobactam (ZOSYN ) IVPB 3.375 g        3.375 g 12.5 mL/hr over 240 Minutes Intravenous Every 8 hours 07/02/24 0440 07/07/24 0104   07/02/24 0500  micafungin  (MYCAMINE ) 100 mg in sodium chloride  0.9 % 100 mL IVPB        100 mg 105 mL/hr over 1 Hours Intravenous Daily 07/02/24 0315 07/06/24 1123   07/02/24 0145  piperacillin -tazobactam (ZOSYN ) IVPB 3.375 g        3.375 g 100 mL/hr over 30 Minutes Intravenous  Once 07/02/24 0134 07/02/24 0354   07/01/24 2237  ceFAZolin  (ANCEF ) 2-4 GM/100ML-% IVPB       Note to Pharmacy: Roddie Grate: cabinet override      07/01/24 2237 07/02/24 1044        Assessment/Plan POD 14, s/p Exploratory laparotomy with [name] repair of perforated peptic ulcer by Dr. Teresa on 07/02/24 for Perforated pyloric channel peptic ulcer  - UGI POD3 negative - tolerating reg diet.  Drain removed postop day 7. - Completed zosyn /micafungin  - H pylori  positive, discussed with pharmacist and will treat with additional 14d quadruple therapy. GI following.  - Cont BID PPI, added bismuth  - PRN IV pain meds. Avoid NSAIDs - Staples removed POD 11 - Mobilize, PT - recs for CIR currently.  - Pulm toilet - tachycardia and low BP improved   FEN - Reg diet, SLIV VTE - SCDs, Lovenox  ID - Zosyn , Micafungin  completed, now on tetracycline , flagyl , PPI, bismuth  for h pylori tx- 2 week course Foley - removed 7/13, voiding Dispo- Peer to peer for CIR declined. Patient wishes for SNF, auth pending for [employer] for Monday   Hx of UC on 40 mg prednisone  per day - was on solucortef, now on prednisone  20 mg BID. Dr. Josiah following. They started infliximab  7/25 Hypothyroidism - synthroid  PO UTI - 5 days zosyn  should be adequate treatment, UCx  never sent  BRBPR - held LMWH, hgb stable on last check. Resolved. 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

## 2024-07-17 NOTE — TOC Progression Note (Signed)
 Transition of Care [facility]) - Progression Note    Patient Details  Name: Allison Finley MRN: [id number] Date of Birth: 1951-09-29  Transition of [facility]) CM/SW Contact  Gwenn Frieze [place], [place] Phone Number: 07/17/2024, 10:30 AM  Clinical Narrative:  [employer] auth submitted for [employer], reference [id number]. Will provide updates as available.   Frieze Gwenn, MSW, LCSW [phone] (coverage)       Expected Discharge Plan: IP Rehab Facility Barriers to Discharge: Continued Medical Work up               Expected Discharge Plan and Services   Discharge Planning Services: CM Consult Post Acute Care Choice: IP Rehab Living arrangements for the past 2 months: Single Family Home                                       Social Drivers of Health (SDOH) Interventions SDOH Screenings   Food Insecurity: Unknown (07/02/2024)  Housing: Low Risk  (07/02/2024)  Transportation Needs: No Transportation Needs (07/02/2024)  Utilities: Not At Risk (07/02/2024)  Social Connections: Moderately Isolated (07/02/2024)  Tobacco Use: Low Risk  (07/01/2024)  Recent Concern: Tobacco Use - Medium Risk (06/06/2024)   Received from [facility]    Readmission Risk Interventions     No data to display

## 2024-07-17 NOTE — Progress Notes (Signed)
 16 Days Post-Op  Subjective: Feels better after infliximab  infusion on Friday.  No new complaints.  Objective: Vital signs in last 24 hours: Temp:  [97.4 F (36.3 C)-98 F (36.7 C)] 97.5 F (36.4 C) (07/27 0740) Pulse Rate:  [78-85] 85 (07/27 0740) Resp:  [16-24] 16 (07/27 0740) BP: (94-108)/(61-70) 105/70 (07/27 0740) SpO2:  [91 %-96 %] 95 % (07/27 0740) Last BM Date : 07/17/24  Intake/Output from previous day: 07/26 0701 - 07/27 0700 In: 370 [P.O.:360; I.V.:10] Out: 800 [Urine:800] Intake/Output this shift: No intake/output data recorded.  PE: Gen:  Alert, NAD, pleasant Heart: regular Lungs: Normal rate and effort Abd: Soft, no distension, NT. Midline wound cdi - staples have been removed Msk: MAE's  Lab Results:  No results for input(s): WBC, HGB, HCT, PLT in the last 72 hours.  BMET No results for input(s): NA, K, CL, CO2, GLUCOSE, BUN, CREATININE, CALCIUM  in the last 72 hours.  PT/INR No results for input(s): LABPROT, INR in the last 72 hours. CMP     Component Value Date/Time   NA 133 (L) 07/10/2024 0504   K 4.4 07/10/2024 0504   CL 96 (L) 07/10/2024 0504   CO2 29 07/10/2024 0504   GLUCOSE 150 (H) 07/10/2024 0504   BUN 10 07/10/2024 0504   CREATININE 0.32 (L) 07/10/2024 0504   CALCIUM  8.1 (L) 07/10/2024 0504   PROT 4.3 (L) 07/02/2024 0629   ALBUMIN  1.9 (L) 07/02/2024 0629   AST 13 (L) 07/02/2024 0629   ALT 16 07/02/2024 0629   ALKPHOS 43 07/02/2024 0629   BILITOT 0.8 07/02/2024 0629   GFRNONAA >60 07/10/2024 0504   Lipase     Component Value Date/Time   LIPASE <10 (L) 08/02/2021 2201    Studies/Results: No results found.  Anti-infectives: Anti-infectives (From admission, onward)    Start     Dose/Rate Route Frequency Ordered Stop   07/07/24 1430  tetracycline  (SUMYCIN ) capsule 500 mg        500 mg Oral 4 times daily 07/07/24 1344     07/07/24 1400  metroNIDAZOLE  (FLAGYL ) tablet 500 mg        500 mg Oral  Every 8 hours 07/07/24 1018 07/21/24 1359   07/07/24 1115  doxycycline  (VIBRA -TABS) tablet 100 mg  Status:  Discontinued        100 mg Oral Every 12 hours 07/07/24 1018 07/07/24 1344   07/02/24 1000  piperacillin -tazobactam (ZOSYN ) IVPB 3.375 g        3.375 g 12.5 mL/hr over 240 Minutes Intravenous Every 8 hours 07/02/24 0440 07/07/24 0104   07/02/24 0500  micafungin  (MYCAMINE ) 100 mg in sodium chloride  0.9 % 100 mL IVPB        100 mg 105 mL/hr over 1 Hours Intravenous Daily 07/02/24 0315 07/06/24 1123   07/02/24 0145  piperacillin -tazobactam (ZOSYN ) IVPB 3.375 g        3.375 g 100 mL/hr over 30 Minutes Intravenous  Once 07/02/24 0134 07/02/24 0354   07/01/24 2237  ceFAZolin  (ANCEF ) 2-4 GM/100ML-% IVPB       Note to Pharmacy: Roddie Grate: cabinet override      07/01/24 2237 07/02/24 1044        Assessment/Plan POD 15, s/p Exploratory laparotomy with [name] repair of perforated peptic ulcer by Dr. Teresa on 07/02/24 for Perforated pyloric channel peptic ulcer  - UGI POD3 negative - tolerating reg diet.  Drain removed postop day 7. - Completed zosyn /micafungin  - H pylori positive, discussed with pharmacist and  will treat with additional 14d quadruple therapy. GI following.  - Cont BID PPI, added bismuth  - PRN IV pain meds. Avoid NSAIDs - Staples removed POD 11 - Mobilize, PT - recs for CIR currently.  - Pulm toilet - tachycardia and low BP improved   FEN - Reg diet, SLIV VTE - SCDs, Lovenox  ID - Zosyn , Micafungin  completed, now on tetracycline , flagyl , PPI, bismuth  for h pylori tx- 2 week course Foley - removed 7/13, voiding Dispo- Peer to peer for CIR declined. Patient wishes for SNF, auth pending for [employer] for Monday   Hx of UC on 40 mg prednisone  per day - was on solucortef, now on prednisone  20 mg BID. Dr. Josiah following. They started infliximab  7/25 Hypothyroidism - synthroid  PO UTI - 5 days zosyn  should be adequate treatment, UCx never sent  BRBPR - held  LMWH, hgb stable on last check. Resolved. 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

## 2024-07-17 NOTE — Plan of Care (Signed)
  Problem: Education: Goal: Knowledge of General Education information will improve Description: Including pain rating scale, medication(s)/side effects and non-pharmacologic comfort measures 07/17/2024 0202 by Marvis Kenneth SAILOR, RN Outcome: Progressing 07/16/2024 2111 by Marvis Kenneth SAILOR, RN Outcome: Progressing   Problem: Health Behavior/Discharge Planning: Goal: Ability to manage health-related needs will improve 07/17/2024 0202 by Marvis Kenneth SAILOR, RN Outcome: Progressing 07/16/2024 2111 by Marvis Kenneth SAILOR, RN Outcome: Progressing   Problem: Clinical Measurements: Goal: Will remain free from infection 07/17/2024 0202 by Marvis Kenneth SAILOR, RN Outcome: Progressing 07/16/2024 2111 by Marvis Kenneth SAILOR, RN Outcome: Progressing   Problem: Activity: Goal: Risk for activity intolerance will decrease 07/17/2024 0202 by Marvis Kenneth SAILOR, RN Outcome: Progressing 07/16/2024 2111 by Marvis Kenneth SAILOR, RN Outcome: Progressing   Problem: Nutrition: Goal: Adequate nutrition will be maintained 07/17/2024 0202 by Marvis Kenneth SAILOR, RN Outcome: Progressing 07/16/2024 2111 by Marvis Kenneth SAILOR, RN Outcome: Progressing   Problem: Elimination: Goal: Will not experience complications related to bowel motility 07/17/2024 0202 by Marvis Kenneth SAILOR, RN Outcome: Progressing 07/16/2024 2111 by Marvis Kenneth SAILOR, RN Outcome: Progressing Goal: Will not experience complications related to urinary retention 07/17/2024 0202 by Marvis Kenneth SAILOR, RN Outcome: Progressing 07/16/2024 2111 by Marvis Kenneth SAILOR, RN Outcome: Progressing   Problem: Pain Managment: Goal: General experience of comfort will improve and/or be controlled 07/17/2024 0202 by Marvis Kenneth SAILOR, RN Outcome: Progressing 07/16/2024 2111 by Marvis Kenneth SAILOR, RN Outcome: Progressing   Problem: Skin Integrity: Goal: Risk for impaired skin integrity will decrease 07/17/2024 0202 by Marvis Kenneth SAILOR, RN Outcome:  Progressing 07/16/2024 2111 by Marvis Kenneth SAILOR, RN Outcome: Progressing

## 2024-07-18 MED ORDER — MELATONIN 3 MG PO TABS: 3.0000 mg | ORAL_TABLET | Freq: Every day | ORAL | Status: DC

## 2024-07-18 MED ORDER — HYDROCORTISONE 100 MG/60ML RE ENEM: 100.0000 mg | ENEMA | Freq: Every day | RECTAL | Status: DC

## 2024-07-18 MED ORDER — ASCORBIC ACID 500 MG PO TABS: 500.0000 mg | ORAL_TABLET | Freq: Two times a day (BID) | ORAL | Status: AC

## 2024-07-18 MED ORDER — FERROUS SULFATE 325 (65 FE) MG PO TABS: 325.0000 mg | ORAL_TABLET | Freq: Two times a day (BID) | ORAL | Status: AC

## 2024-07-18 MED ORDER — BISMUTH SUBSALICYLATE 262 MG/15ML PO SUSP: 30.0000 mL | Freq: Three times a day (TID) | ORAL | Status: AC

## 2024-07-18 MED ORDER — ACETAMINOPHEN 500 MG PO TABS: 1000.0000 mg | ORAL_TABLET | Freq: Four times a day (QID) | ORAL | Status: DC | PRN

## 2024-07-18 MED ORDER — METRONIDAZOLE 500 MG PO TABS: 500.0000 mg | ORAL_TABLET | Freq: Three times a day (TID) | ORAL | Status: DC

## 2024-07-18 MED ORDER — METRONIDAZOLE 500 MG PO TABS: 500.0000 mg | ORAL_TABLET | Freq: Three times a day (TID) | ORAL | Status: AC

## 2024-07-18 MED ORDER — TETRACYCLINE HCL 500 MG PO CAPS: 500.0000 mg | ORAL_CAPSULE | Freq: Four times a day (QID) | ORAL | Status: AC

## 2024-07-18 MED ORDER — TETRACYCLINE HCL 500 MG PO CAPS: 500.0000 mg | ORAL_CAPSULE | Freq: Four times a day (QID) | ORAL | Status: DC

## 2024-07-18 MED ORDER — ADULT MULTIVITAMIN W/MINERALS CH: 1.0000 | ORAL_TABLET | Freq: Every day | ORAL | Status: AC

## 2024-07-18 MED ORDER — PREDNISONE 20 MG PO TABS: 20.0000 mg | ORAL_TABLET | Freq: Two times a day (BID) | ORAL | Status: AC

## 2024-07-18 MED ORDER — BISMUTH SUBSALICYLATE 262 MG/15ML PO SUSP: 30.0000 mL | Freq: Three times a day (TID) | ORAL | Status: DC

## 2024-07-18 MED ORDER — PANTOPRAZOLE SODIUM 40 MG PO TBEC: 40.0000 mg | DELAYED_RELEASE_TABLET | Freq: Two times a day (BID) | ORAL | Status: DC

## 2024-07-18 NOTE — Progress Notes (Signed)
 17 Days Post-Op  Subjective: No abdominal pain, n/v. BM yesterday that was more formed and non-bloody. Voiding. RN to repeat vitals.   Objective: Vital signs in last 24 hours: Temp:  [97.4 F (36.3 C)-97.8 F (36.6 C)] 97.6 F (36.4 C) (07/28 0747) Pulse Rate:  [78-106] 106 (07/28 0747) Resp:  [16-20] 16 (07/28 0747) BP: (93-112)/(67-77) 107/74 (07/28 0747) SpO2:  [94 %-99 %] 96 % (07/28 0747) Last BM Date : 07/17/24  Intake/Output from previous day: 07/27 0701 - 07/28 0700 In: 250 [P.O.:240; I.V.:10] Out: 700 [Urine:700] Intake/Output this shift: No intake/output data recorded.  PE: Gen:  Alert, NAD, pleasant Heart: reg Lungs: Normal rate and effort Abd: Soft, no distension, NT. Midline wound cdi - staples have been removed Msk: MAE's  Lab Results:  No results for input(s): WBC, HGB, HCT, PLT in the last 72 hours.  BMET No results for input(s): NA, K, CL, CO2, GLUCOSE, BUN, CREATININE, CALCIUM  in the last 72 hours.  PT/INR No results for input(s): LABPROT, INR in the last 72 hours. CMP     Component Value Date/Time   NA 133 (L) 07/10/2024 0504   K 4.4 07/10/2024 0504   CL 96 (L) 07/10/2024 0504   CO2 29 07/10/2024 0504   GLUCOSE 150 (H) 07/10/2024 0504   BUN 10 07/10/2024 0504   CREATININE 0.32 (L) 07/10/2024 0504   CALCIUM  8.1 (L) 07/10/2024 0504   PROT 4.3 (L) 07/02/2024 0629   ALBUMIN  1.9 (L) 07/02/2024 0629   AST 13 (L) 07/02/2024 0629   ALT 16 07/02/2024 0629   ALKPHOS 43 07/02/2024 0629   BILITOT 0.8 07/02/2024 0629   GFRNONAA >60 07/10/2024 0504   Lipase     Component Value Date/Time   LIPASE <10 (L) 08/02/2021 2201    Studies/Results: No results found.  Anti-infectives: Anti-infectives (From admission, onward)    Start     Dose/Rate Route Frequency Ordered Stop   07/07/24 1430  tetracycline  (SUMYCIN ) capsule 500 mg        500 mg Oral 4 times daily 07/07/24 1344     07/07/24 1400  metroNIDAZOLE  (FLAGYL )  tablet 500 mg        500 mg Oral Every 8 hours 07/07/24 1018 07/21/24 1359   07/07/24 1115  doxycycline  (VIBRA -TABS) tablet 100 mg  Status:  Discontinued        100 mg Oral Every 12 hours 07/07/24 1018 07/07/24 1344   07/02/24 1000  piperacillin -tazobactam (ZOSYN ) IVPB 3.375 g        3.375 g 12.5 mL/hr over 240 Minutes Intravenous Every 8 hours 07/02/24 0440 07/07/24 0104   07/02/24 0500  micafungin  (MYCAMINE ) 100 mg in sodium chloride  0.9 % 100 mL IVPB        100 mg 105 mL/hr over 1 Hours Intravenous Daily 07/02/24 0315 07/06/24 1123   07/02/24 0145  piperacillin -tazobactam (ZOSYN ) IVPB 3.375 g        3.375 g 100 mL/hr over 30 Minutes Intravenous  Once 07/02/24 0134 07/02/24 0354   07/01/24 2237  ceFAZolin  (ANCEF ) 2-4 GM/100ML-% IVPB       Note to Pharmacy: Roddie Grate: cabinet override      07/01/24 2237 07/02/24 1044        Assessment/Plan POD 16, s/p Exploratory laparotomy with [name] repair of perforated peptic ulcer by Dr. Teresa on 07/02/24 for Perforated pyloric channel peptic ulcer  - UGI POD3 negative - tolerating reg diet.  Drain removed postop day 7. - Completed zosyn /micafungin  -  H pylori positive, discussed with pharmacist and will treat with additional 14d quadruple therapy. GI has seen - Cont BID PPI, added bismuth  - PRN IV pain meds. Avoid NSAIDs - Staples removed POD 11 - Mobilize, PT - recs for CIR currently.  - Pulm toilet   FEN - Reg diet, SLIV VTE - SCDs, Lovenox  ID - Zosyn , Micafungin  completed, now on tetracycline , flagyl , PPI, bismuth  for h pylori tx- 2 week course Foley - removed 7/13, voiding Dispo- Peer to peer for CIR declined. Patient wishes for SNF, auth pending for [facility] for Monday. Will touch base with TOC.    Hx of UC on 40 mg prednisone  per day - was on solucortef, now on prednisone  20 mg BID. Dr. Josiah following. They started infliximab  7/25 Hypothyroidism - synthroid  PO UTI - 5 days zosyn  should be adequate treatment, UCx  never sent  BRBPR - held LMWH, hgb stable on last check. Resolved. 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

## 2024-07-18 NOTE — Progress Notes (Signed)
 Discharge papers handed to PTAR. Handoff report given to Kayla, LPN at [facility]. Abdominal incision clean, dry, intact. Midline removed from LUE. Pt transported off unit via stretcher with all personal belongings. PTAR to transport pt to [facility]. Pt's daughter updated.

## 2024-07-18 NOTE — Progress Notes (Signed)
 Physical Therapy Treatment Patient Details Name: Allison Finley MRN: [id number] DOB: [birth year] Today's Date: 07/18/2024   History of Present Illness Pt is a 73 y.o. female who presented 07/01/24 with severe abdominal pain. Pt found to have perforated pyloric channel peptic ulcer, s/p ex lap with repair 7/12. PMH: ulcerative colitis    PT Comments  Patient up in chair on arrival. Continues to have difficulty with sequencing sit to stand with ?effort when only +1 assist attempted x2 unsuccessfully. With +2 assist pt appeared to have better effort and able to clear buttocks from chair with assist to fully extend knees and hips. Able to progress to ambulation with +1 assist x 65 ft with RW. Pt requested return to recliner and completed seated and reclined LE exercises with assist.     If plan is discharge home, recommend the following: A lot of help with bathing/dressing/bathroom;Assistance with cooking/housework;Assist for transportation;Help with stairs or ramp for entrance;Two people to help with walking and/or transfers   Can travel by private vehicle     No  Equipment Recommendations  None recommended by PT    Recommendations for Other Services       Precautions / Restrictions Precautions Precautions: Fall Recall of Precautions/Restrictions: Intact Precaution/Restrictions Comments: abdominal precautions Restrictions Weight Bearing Restrictions Per Provider Order: No     Mobility  Bed Mobility               General bed mobility comments: up in recliner and wanted to stay OOB    Transfers Overall transfer level: Needs assistance Equipment used: Rolling walker (2 wheels) Transfers: Sit to/from Stand Sit to Stand: Mod assist, +2 physical assistance           General transfer comment: attempted x 2 with +1 assist with no clearance of buttocks from seat;  with +2 pt with better effort  with assist to clear buttocks, extend hips and knees     Ambulation/Gait Ambulation/Gait assistance: Min assist Gait Distance (Feet): 65 Feet Assistive device: Rolling walker (2 wheels) Gait Pattern/deviations: Step-through pattern, Decreased stride length, Narrow base of support Gait velocity: decr     General Gait Details: improved upright posture; heavy reliance on UEs; vc for increasing foot clearance and heel-toe gait with good carryover   Stairs             Wheelchair Mobility     Tilt Bed    Modified Rankin (Stroke Patients Only)       Balance Overall balance assessment: Needs assistance Sitting-balance support: No upper extremity supported, Feet supported Sitting balance-Leahy Scale: Fair     Standing balance support: Reliant on assistive device for balance, Bilateral upper extremity supported Standing balance-Leahy Scale: Poor Standing balance comment: reliant on RW                            Communication Communication Communication: No apparent difficulties  Cognition Arousal: Alert Behavior During Therapy: WFL for tasks assessed/performed, Anxious (easily becomes anxious; does well with encouragement)   PT - Cognitive impairments: Sequencing                       PT - Cognition Comments: difficulty sequencing sit to stand task Following commands: Intact Following commands impaired: Follows multi-step commands with increased time    Cueing Cueing Techniques: Verbal cues, Tactile cues  Exercises General Exercises - Lower Extremity Ankle Circles/Pumps: AROM, Both, 10 reps Heel Slides: AAROM, Strengthening, Both,  5 reps (assisted flexion, resisted extension) Straight Leg Raises: AAROM, Both, 5 reps    General Comments        Pertinent Vitals/Pain Pain Assessment Pain Assessment: Faces Faces Pain Scale: Hurts a little bit Pain Location: buttocks Pain Descriptors / Indicators: Sore Pain Intervention(s): Limited activity within patient's tolerance, Monitored during session,  Other (comment) (geomat cushion in chair)    Home Living                          Prior Function            PT Goals (current goals can now be found in the care plan section) Acute Rehab PT Goals Patient Stated Goal: to return to being independent PT Goal Formulation: With patient Time For Goal Achievement: 08/01/24 Potential to Achieve Goals: Good Progress towards PT goals: Progressing toward goals;Goals updated    Frequency    Min 2X/week      PT Plan      Co-evaluation              AM-PAC PT 6 Clicks Mobility   Outcome Measure  Help needed turning from your back to your side while in a flat bed without using bedrails?: A Little Help needed moving from lying on your back to sitting on the side of a flat bed without using bedrails?: A Lot Help needed moving to and from a bed to a chair (including a wheelchair)?: Total Help needed standing up from a chair using your arms (e.g., wheelchair or bedside chair)?: Total Help needed to walk in hospital room?: A Little Help needed climbing 3-5 steps with a railing? : Total 6 Click Score: 11    End of Session Equipment Utilized During Treatment: Gait belt Activity Tolerance: Patient tolerated treatment well Patient left: in chair;with call bell/phone within reach Nurse Communication: Mobility status PT Visit Diagnosis: Other abnormalities of gait and mobility (R26.89);Muscle weakness (generalized) (M62.81);Pain     Time: 8995-8977 PT Time Calculation (min) (ACUTE ONLY): 18 min  Charges:    $Therapeutic Exercise: 8-22 mins PT General Charges $$ ACUTE PT VISIT: 1 Visit                      Macario RAMAN, PT Acute Rehabilitation Services  Office [phone]    [name] 07/18/2024, 10:32 AM

## 2024-07-18 NOTE — Discharge Summary (Signed)
 Patient ID: Allison Finley [id number] 04/28/1951 73 y.o.  Admit date: 07/01/2024 Discharge date: 07/18/2024  Discharge Diagnosis POD 16, s/p Exploratory laparotomy with [name] repair of perforated peptic ulcer by Dr. 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 female recently diagnosed with UC and is currently taking steroids, currently 40 mg prednisone  per day, presented to the emergency department with complaints of severe abdominal pain.  She noted that this really began today.  She does have some intermittent chronic abdominal pain that has been going on for some time.  This pain however has been in her mid epigastrium.  It is also in her right upper quadrant.  She describes it as being more diffuse however.  Does report nausea but no emesis.  No fever or chills.   Flex sig 04/2024 Dr. Legrand - - Diffuse severe inflammation was found in the rectum and in the distal sigmoid colon secondary to colitis. Biopsied.    - Active chronic colitis, consistent with inflammatory bowel disease.  - Negative for dysplasia and malignancy.  - No viral cytopathic change or basal crypt epithelial apoptosis  identified    No hx of recent NSAID use including ibuprofen/motrin/advil, ASA, naproxen/alleve   Her daughter Mliss Aquas is present at bedside and is a registered nurse  Procedures Dr. Teresa - 06/22/24 Exploratory laparotomy with [name] repair of perforated peptic ulcer   Hospital Course:  Patient presented as above and was taken to the OR for Exploratory laparotomy with [name] repair of perforated peptic ulcer by Dr. Teresa on 07/02/24 for Perforated pyloric channel peptic ulcer. Patient tolerated the procedure well and was transferred to the floor post op on abx  with NGT in place. Patient with hx of UC on steroids for which TRH and GI were consulted to help manage. On POD 3 UGI was negative. Diet advanced and tolerated. JP Drain removed postop day 7. She completed zosyn /micafungin  post op. H pylori positive, she was started on additional 14d quadruple therapy starting 7/17. -Staples removed POD 11. GI started infliximab  7/25. Therapies worked with the patient and recommended CIR. Patient was denied for CIR. Patient wishes for SNF. On POD 16 the patient was voiding well, tolerating diet, working with therapies, pain well controlled, vital signs stable, incisions c/d/i and felt stable for discharge to SNF. Follow up as noted below.   Allergies as of 07/18/2024       Reactions   Nsaids Other (See Comments)   Perforated peptic ulcer   Beef-derived Drug Products Other (See Comments)   Vegetarian    Chicken Allergy Other (See Comments)   Vegetarian    Pork-derived Products Other (See Comments)   vegetarian        Medication List     STOP taking these medications    mesalamine 0.375 g 24 hr capsule Commonly known as: APRISO       TAKE these medications    acetaminophen  500 MG tablet Commonly known as: TYLENOL  Take 2 tablets (1,000 mg total) by mouth every 6 (six) hours as needed for mild pain (pain score 1-3) or fever.   ascorbic acid  500 MG tablet Commonly known as: VITAMIN C  Take 1 tablet (500 mg total) by mouth 2 (two) times daily.   bismuth  subsalicylate 262 MG/15ML suspension Commonly known  as: PEPTO BISMOL Take 30 mLs by mouth 4 (four) times daily -  before meals and at bedtime.   clobetasol cream 0.05 % Commonly known as: TEMOVATE Apply 1 Application topically daily as needed (psoriasis).   ferrous sulfate  325 (65 FE) MG tablet Take 1 tablet (325 mg total) by mouth 2 (two) times daily with a meal.   fluticasone 50 MCG/ACT nasal spray Commonly known as: FLONASE Place 2 sprays into both nostrils daily as needed for  allergies.   hydrocortisone  100 MG/60ML enema Commonly known as: CORTENEMA  Place 1 enema (100 mg total) rectally at bedtime.   levothyroxine  112 MCG tablet Commonly known as: SYNTHROID  Take 112 mcg by mouth daily.   melatonin 3 MG Tabs tablet Take 1 tablet (3 mg total) by mouth at bedtime.   metroNIDAZOLE  500 MG tablet Commonly known as: FLAGYL  Take 1 tablet (500 mg total) by mouth every 8 (eight) hours.   multivitamin with minerals Tabs tablet Take 1 tablet by mouth daily. Start taking on: July 19, 2024   omeprazole  40 MG capsule Commonly known as: PRILOSEC Take 40 mg by mouth daily as needed (reflux).   pantoprazole  40 MG tablet Commonly known as: PROTONIX  Take 1 tablet (40 mg total) by mouth 2 (two) times daily.   predniSONE  20 MG tablet Commonly known as: DELTASONE  Take 1 tablet (20 mg total) by mouth 2 (two) times daily with a meal. What changed:  medication strength See the new instructions.   rosuvastatin  5 MG tablet Commonly known as: CRESTOR  Take 5 mg by mouth at bedtime.   tetracycline  500 MG capsule Commonly known as: SUMYCIN  Take 1 capsule (500 mg total) by mouth 4 (four) times daily.   VITAMIN B-12 PO Take 1 capsule by mouth daily.   ZINC  PLUS VITAMIN C  PO Take 1 tablet by mouth daily.          Contact information for follow-up providers     Teresa Lonni HERO, MD Follow up on 08/08/2024.   Specialties: General Surgery, Colon and Rectal Surgery Why: 910am. Please bring a copy of your photo ID, insurance card and arrive 30 minutes prior to your appointment for paperwork. Contact information: [address] [place] [address] [phone]         Rollin Dover, MD Follow up.   Specialty: Gastroenterology Contact information: [address] [place] [address] [phone]         Lynwood Laneta ORN, PA-C Follow up.   Specialties: Family Medicine, Physician Assistant Contact  information: [address] [place] [address] [phone]              Contact information for after-discharge care     Destination     [facility]   Service: Skilled Nursing Contact information: [address] [place]  [address] [phone]                     Signed: Ozell HERO Shaper, [facility] Surgery 07/18/2024, 11:49 AM Please see Amion for pager number during day hours 7:00am-4:30pm

## 2024-07-18 NOTE — Progress Notes (Signed)
 Midline to LUE removed per Ozell Shaper, PA verbal order. Catheter tip intact. Pt tolerated removal well.

## 2024-07-18 NOTE — TOC Transition Note (Signed)
 Transition of Care [facility]) - Discharge Note   Patient Details  Name: Allison Finley MRN: [id number] Date of Birth: [birth year]  [facility] CM/SW Contact:  Lauraine FORBES Saa, LCSW Phone Number: 07/18/2024, 12:57 PM   Clinical Narrative:     Patient will DC to: [facility] Anticipated DC date: 07/18/2024 Family notified: Mliss Aquas; Daughter; [phone] Transport by: [facility]  Per MD patient ready for DC to [facility]. RN to call report prior to discharge [phone]). RN, patient, patient's family (by patient), and facility notified of DC. Discharge Summary and FL2 sent to facility. DC packet on chart. Ambulance transport requested for patient at 12:55. SNF insurance authorization was approved and is valid 07/28-07/30.  CSW will sign off for now as social work intervention is no longer needed. Please consult us  again if new needs arise.   Final next level of care: Skilled Nursing Facility Barriers to Discharge: Barriers Resolved   Patient Goals and CMS Choice Patient states their goals for this hospitalization and ongoing recovery are:: SNF CMS Medicare.gov Compare Post Acute Care list provided to:: Patient Choice offered to / list presented to : Patient      Discharge Placement              Patient chooses bed at: [facility] [place] Patient to be transferred to facility by: PTAR Name of family member notified: Mliss Aquas; Daughter; [phone] Patient and family notified of of transfer: 07/18/24  Discharge Plan and Services Additional resources added to the After Visit Summary for   In-house Referral: Clinical Social Work Discharge Planning Services: CM Consult Post Acute Care Choice: Skilled Nursing Facility          DME Arranged: N/A DME Agency: NA       HH Arranged: NA HH Agency: NA        Social Drivers of Health (SDOH) Interventions SDOH Screenings   Food Insecurity: Unknown (07/02/2024)  Housing: Low Risk   (07/02/2024)  Transportation Needs: No Transportation Needs (07/02/2024)  Utilities: Not At Risk (07/02/2024)  Social Connections: Moderately Isolated (07/02/2024)  Tobacco Use: Low Risk  (07/01/2024)  Recent Concern: Tobacco Use - Medium Risk (06/06/2024)   Received from [facility]     Readmission Risk Interventions    07/18/2024   11:32 AM  Readmission Risk Prevention Plan  Transportation Screening Complete  Home Care Screening Complete  Medication Review (RN CM) Complete

## 2024-07-18 NOTE — Plan of Care (Signed)
  Problem: Education: Goal: Knowledge of General Education information will improve Description: Including pain rating scale, medication(s)/side effects and non-pharmacologic comfort measures 07/18/2024 0116 by Marvis Kenneth SAILOR, RN Outcome: Progressing 07/17/2024 1952 by Marvis Kenneth SAILOR, RN Outcome: Progressing   Problem: Health Behavior/Discharge Planning: Goal: Ability to manage health-related needs will improve 07/18/2024 0116 by Marvis Kenneth SAILOR, RN Outcome: Progressing 07/17/2024 1952 by Marvis Kenneth SAILOR, RN Outcome: Progressing   Problem: Clinical Measurements: Goal: Will remain free from infection 07/18/2024 0116 by Marvis Kenneth SAILOR, RN Outcome: Progressing 07/17/2024 1952 by Marvis Kenneth SAILOR, RN Outcome: Progressing   Problem: Activity: Goal: Risk for activity intolerance will decrease 07/18/2024 0116 by Marvis Kenneth SAILOR, RN Outcome: Progressing 07/17/2024 1952 by Marvis Kenneth SAILOR, RN Outcome: Progressing   Problem: Nutrition: Goal: Adequate nutrition will be maintained 07/18/2024 0116 by Marvis Kenneth SAILOR, RN Outcome: Progressing 07/17/2024 1952 by Marvis Kenneth SAILOR, RN Outcome: Progressing   Problem: Elimination: Goal: Will not experience complications related to bowel motility 07/18/2024 0116 by Marvis Kenneth SAILOR, RN Outcome: Progressing 07/17/2024 1952 by Marvis Kenneth SAILOR, RN Outcome: Progressing Goal: Will not experience complications related to urinary retention 07/18/2024 0116 by Marvis Kenneth SAILOR, RN Outcome: Progressing 07/17/2024 1952 by Marvis Kenneth SAILOR, RN Outcome: Progressing   Problem: Pain Managment: Goal: General experience of comfort will improve and/or be controlled 07/18/2024 0116 by Marvis Kenneth SAILOR, RN Outcome: Progressing 07/17/2024 1952 by Marvis Kenneth SAILOR, RN Outcome: Progressing   Problem: Skin Integrity: Goal: Risk for impaired skin integrity will decrease 07/18/2024 0116 by Marvis Kenneth SAILOR, RN Outcome:  Progressing 07/17/2024 1952 by Marvis Kenneth SAILOR, RN Outcome: Progressing

## 2024-08-24 ENCOUNTER — Telehealth: Payer: Self-pay | Admitting: Gastroenterology

## 2024-08-24 NOTE — Telephone Encounter (Signed)
 Pt called and stated that she was going to stop by in order to sign a medical release form for her mother. Patient daughter also has power of attorney documents which she will provide in case we need those in order for her sign the medical release form. Patient is wanting to schedule an appointment with Dr. Suzann. Pt was advise we need to do a transfer of care due to her being seen with [name] in June of 2025. Please advise.

## 2024-08-29 NOTE — Telephone Encounter (Signed)
 Pt daughter called back and stated that she would like for us  to either schedule her mother with Dr. Charlanne or McGreal whoever has the soonest availability. Please advise.

## 2024-09-09 ENCOUNTER — Telehealth: Payer: Self-pay | Admitting: Gastroenterology

## 2024-09-09 NOTE — Telephone Encounter (Signed)
 Good Afternoon Danis,    Inbound call patient daughter stating that she would like to transfer her care from [name] office to our office with Dr. Suzann. Daughter is stated that her mother is having a lot of Abdominal pain along with other symptoms that include urgency, incontinence, and loose bowel moments that are caused by malnourishment. I do see that this patient had a procedure with you at the hospital back in may. Patient records can be view in Care every where. Would you please advise.    Thank you.

## 2024-09-12 ENCOUNTER — Encounter: Payer: Self-pay | Admitting: Pediatrics

## 2024-09-12 NOTE — Telephone Encounter (Signed)
 Request received to transfer GI care from outside practice to [facility].  I appreciate the interest in our practice, however due to high demand from patients without established GI providers, I cannot accommodate this transfer.   You can certainly check with Dr. Suzann (which the family may have requested because of her expertise in IBD), and see if she is available and willing to accept the transfer.  VEAR Brand MD

## 2024-09-12 NOTE — Telephone Encounter (Signed)
 Good afternoon Dr. Suzann,   I received a call from this patient daughter requesting to be seen by either you or Dr. Legrand. Per my message below you would please advise on how to schedule this patient. Pt records can be view on Care everywhere.    Thank you.

## 2024-10-12 NOTE — Progress Notes (Addendum)
 [facility] [place]  Return Patient Visit Allison Finley DOB: 17-Jan-1951  MRN: [id number] Visit Date: 10/12/2024  Encounter Provider: Gustav Almarie Pack, FNP  Subjective Allison Finley is a 73 y.o. female who presents for a follow up after being discharged from [facility] facility. She would like labs rechecked as well.  History of Present Illness The patient presents for a follow-up visit after being discharged on 10/9 from [facility].   She was initially admitted to the hospital due to hematochezia in 04/2024, which led to a diagnosis of inflammatory bowel disease and ulcerative colitis following a scan. Her hospital stay lasted slightly over a week. Upon discharge, she was able to attend a few medical appointments and undergo lab work. However, approximately 1.5 weeks post-discharge, her condition deteriorated to the point where she was unable to drive or perform daily activities independently, necessitating reliance on family members. Within 2 to 3 weeks, she developed severe abdominal pain, prompting her to seek care at the emergency room. She had emergency surgery and was subsequently admitted to the ICU for 17 days. During her hospital stay, she was unable to ambulate and was not given anticoagulants due to her ulcer. After discharge from the hospital, she was moved to [facility] facility where she has been up until October 9th. She states that after being at the facility for a couple of weeks she  began to notice swelling, pain and redness in her left upper leg. An ultrasound was completed at the facility confirmed a DVT. She reports no current pain in the area but notes that the swelling has not subsided. She experiences numbness in her legs when standing for extended periods. She wears compression stockings during the day but not at night. She reports no redness in her legs but notes that they turn blue during showers. She has been on Eliquis since.   Since being home, she reports feeling better both mentally and physically, attributing this improvement to family support. She has been using a walker and receives home health care, including physical therapy twice a week, occupational therapy once a week, and nursing care once a week.  She was prescribed pantoprazole  40 mg twice daily at [facility] but has been taking omeprazole  at home.  She has been producing excessive mucus but reports no recent illness, fever, or other symptoms. No chest pain, SOB. Color changes from clear to yellow. Has been trying to increase fluids.    FAMILY HISTORY Her mother had factor 5.  Review of Systems  Constitutional:  Positive for fatigue. Negative for activity change, appetite change, chills and fever.  HENT:  Negative for congestion and rhinorrhea.   Respiratory:  Positive for cough. Negative for chest tightness, shortness of breath and wheezing.   Cardiovascular:  Positive for leg swelling. Negative for chest pain and palpitations.  Gastrointestinal: Negative.   Skin: Negative.   Neurological:  Positive for weakness.     Behavioral Health Screening  Patient Health Questionnaire-2 Score: 0 (10/12/2024 11:23 AM)      Patient's Depression screening is Negative   Depression Plan: Normal/Negative Screening  Objective Blood pressure 110/69, pulse 82, temperature 98.2 F (36.8 C), height 1.575 m (5' 2), weight 54.4 kg (120 lb), SpO2 95%, not currently breastfeeding. Physical Exam   Physical  Exam Constitutional:      Appearance: Normal appearance.  Cardiovascular:     Rate and Rhythm: Normal rate and regular rhythm.     Pulses: Normal pulses.  Heart sounds: Normal heart sounds. No murmur heard. Pulmonary:     Effort: Pulmonary effort is normal. No respiratory distress.     Breath sounds: Normal breath sounds. No stridor. No wheezing or rhonchi.  Musculoskeletal:     Cervical back: Normal range of motion.     Left upper leg: Normal. No swelling, edema or tenderness.  Skin:    General: Skin is warm and dry.  Neurological:     General: No focal deficit present.     Mental Status: She is alert and oriented to person, place, and time.  Psychiatric:        Mood and Affect: Mood normal.        Behavior: Behavior normal.     Medical History: Medical History[1]  Patient Active Problem List   Diagnosis Date Noted  . Gastroesophageal reflux disease 06/06/2024  . Ulcerative (chronic) pancolitis with rectal bleeding 06/06/2024  . Cystocele, unspecified 05/22/2024  . Iron  deficiency anemia 05/22/2024  . Ulcerative colitis 05/20/2024  . Hematochezia 05/19/2024  . 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 Constipation 11/17/2022  . Hypercholesteremia 06/12/2016  . Hypothyroidism 06/12/2016  . Osteopenia 06/12/2016  . Psoriasis 06/12/2016   Current Medications:  Medications Ordered Prior to Encounter[2] Current Medications[3]  Allergies: Allergies[4]   Immunizations:  Immunization History  Administered Date(s) Administered  . Hep A, Unspecified 01/14/1996  . Hepatitis A 10/12/2014  . PPD Test 06/29/2024  . Pfizer SARS-CoV-2 Primary Series 12+ yrs 02/04/2020, 02/28/2020  . TD (Adult), 2 Lf Tetanus Toxoid, Preservative Free 01/14/1996  . TDAP VACCINE (BOOSTRIX,ADACEL) 7Y+ 10/12/2013  . Zoster, Live 04/12/2013    Surgical History- Surgical History[5]  Family history- Family History[6] Social history- Social History[7]   Labs: No results found for this or  any previous visit (from the past week).    Assessment & Plan 1. Iron  deficiency anemia due to chronic blood loss (Primary) - Basic Metabolic Panel - CBC with Differential - Anemia Profile  Labs were collected today for better evaluation and to see where she is at post surgery and rehab stay. Last labs that were collected were in July 2025  2. Deep vein thrombosis (DVT) of femoral vein of left lower extremity, unspecified chronicity - Factor V Leiden DNA Mutation Analysis (Thrombophilia Testing)  On exam, patient does not have any tenderness, pain, swelling or redness in the lower left extremity. She has a family history of Factor 5 and is requesting that we check her today for it. Labs were collected. We discussed how this will help us  to know in the future if its needed to stay Eliquis or not. She is currently wearing compression stockings during the day, I recommended for her to continue doing this.   3. Declining mobility  She was able to get an order from [employer], [place] for a walker and ramp at her home. This was provided to the family during the visit.  4. Weakness      Problem List Items Addressed This Visit       Other   Iron  deficiency anemia - Primary   Relevant Medications   omeprazole  (PriLOSEC) 40 mg DR capsule   Other Relevant Orders   Basic Metabolic Panel   CBC with Differential   Anemia Profile   Other Visit Diagnoses       Deep vein thrombosis (DVT) of femoral vein of left lower extremity, unspecified chronicity       Relevant Orders   Factor V Leiden DNA Mutation Analysis (Thrombophilia  Testing)     Declining mobility         Weakness           There are no Patient Instructions on file for this visit.  No follow-ups on file.  Portions of this note were created using DAX Copilot software. Although proofread, errors may be present.  I have personally spent 30 minutes involved in face-to-face and non-face-to-face activities for this patient on  the day of the visit.  Professional time spent includes the following activities, in addition to those noted in the documentation:  - preparing to see the patient (e.g., review of recent and/or remote lab/imaging/study results, provider notes, and patient messages/phone calls available in current EMR, CareEverywhere, and scanned records) -obtaining and/or reviewing separately obtained history either through past provider notes, patient phone calls, and/or patient's family member(s)/caregiver(s) -performing a medically appropriate examination and/or evaluation -counseling and educating the patient/family/caregiver -ordering medications, tests, or procedures -documenting clinical information in the electronic or other health record -reviewing most up to date studies or expert consensus guidelines for screening/diagnosing/treating pertinent conditions/symptoms -independently interpreting results (not separately reported) and communicating results to the patient/family/caregiver -care coordination (not separately reported) -referring and communicating with other health care professionals (when not separately reported)  Gustav Almarie Pack, FNP 12:45 PM       [1] Past Medical History: Diagnosis Date  . Cataract    I had eye surgery on both eyes in 2023  . Eczema 2014  . Gastroesophageal reflux disease 06/06/2024  . Hypercholesterolemia   . Hypothyroidism   . Ulcerative colitis 05/20/2024  . Varicella   [2] Current Outpatient Medications on File Prior to Visit  Medication Sig Dispense Refill  . apixaban (ELIQUIS) 5 mg tab Take 5 mg by mouth.    . cholestyramine 4 g packet Take 4 g by mouth 2 (two) times a day.    . cholestyramine 4 g packet Take 4 g by mouth daily.    . ferrous gluconate 324 mg (38 mg iron ) tab tablet Take 38 mg of iron  by mouth daily.    . levothyroxine  (SYNTHROID ) 112 mcg tablet TAKE 1 TABLET EVERY MORNING 60 tablet 5  . miscellaneous medical supply misc  Prescription for wheelchair ramp. This is medically necessary for patient. 1 each 0  . omeprazole  (PriLOSEC) 40 mg DR capsule Take 1 capsule (40 mg total) by mouth in the morning. 90 capsule 3  . predniSONE  (DELTASONE ) 10 mg tablet Take 10 mg by mouth 2 (two) times a day.    . rosuvastatin  (CRESTOR ) 5 mg tablet TAKE 1 TABLET EVERY DAY 90 tablet 3  . walker misc Use as directed. Pediatric size requested. 1 each 0  . clobetasoL (TEMOVATE) 0.05 % cream Apply 1 Application topically 2 (two) times a day. (Patient not taking: Reported on 10/12/2024) 60 g 0  . pantoprazole  (PROTONIX ) 40 mg EC tablet Take 1 tablet (40 mg total) by mouth 2 (two) times a day. (Patient not taking: Reported on 10/12/2024) 180 tablet 3   No current facility-administered medications on file prior to visit.  [3]  Current Outpatient Medications:  .  apixaban (ELIQUIS) 5 mg tab, Take 5 mg by mouth., Disp: , Rfl:  .  cholestyramine 4 g packet, Take 4 g by mouth 2 (two) times a day., Disp: , Rfl:  .  cholestyramine 4 g packet, Take 4 g by mouth daily., Disp: , Rfl:  .  ferrous gluconate 324 mg (38 mg iron ) tab tablet, Take 38 mg  of iron  by mouth daily., Disp: , Rfl:  .  levothyroxine  (SYNTHROID ) 112 mcg tablet, TAKE 1 TABLET EVERY MORNING, Disp: 60 tablet, Rfl: 5 .  miscellaneous medical supply misc, Prescription for wheelchair ramp. This is medically necessary for patient., Disp: 1 each, Rfl: 0 .  omeprazole  (PriLOSEC) 40 mg DR capsule, Take 1 capsule (40 mg total) by mouth in the morning., Disp: 90 capsule, Rfl: 3 .  predniSONE  (DELTASONE ) 10 mg tablet, Take 10 mg by mouth 2 (two) times a day., Disp: , Rfl:  .  rosuvastatin  (CRESTOR ) 5 mg tablet, TAKE 1 TABLET EVERY DAY, Disp: 90 tablet, Rfl: 3 .  walker misc, Use as directed. Pediatric size requested., Disp: 1 each, Rfl: 0 .  clobetasoL (TEMOVATE) 0.05 % cream, Apply 1 Application topically 2 (two) times a day. (Patient not taking: Reported on 10/12/2024), Disp: 60 g, Rfl:  0 .  pantoprazole  (PROTONIX ) 40 mg EC tablet, Take 1 tablet (40 mg total) by mouth 2 (two) times a day. (Patient not taking: Reported on 10/12/2024), Disp: 180 tablet, Rfl: 3 [4] No Known Allergies [5] Past Surgical History: Procedure Laterality Date  . EYE SURGERY  2023   Cataract  . MOUTH SURGERY      Procedure: MOUTH SURGERY  [6] Family History Problem Relation Name Age of Onset  . High Cholesterol Mother Cy Brooks   . Rheum arthritis Mother Cy Brooks   . Arthritis Mother Cy Brooks   . Hearing loss Mother Cy Brooks   . Hypertension Mother Cy Brooks   . COPD Brother Elsie Hurst   . Hearing loss Brother Elsie Hurst   . Diabetes Son Kiki Sax   [7] Social History Socioeconomic History  . Marital status: Single  Tobacco Use  . Smoking status: Former  . Smokeless tobacco: Never  Substance and Sexual Activity  . Alcohol use: No  . Drug use: Never  . Sexual activity: Not Currently   Social Drivers of Health   Food Insecurity: Low Risk  (10/12/2024)   Food vital sign   . Within the past 12 months, you worried that your food would run out before you got money to buy more: Never true   . Within the past 12 months, the food you bought just didn't last and you didn't have money to get more: Never true  Transportation Needs: No Transportation Needs (10/12/2024)   Transportation   . In the past 12 months, has lack of reliable transportation kept you from medical appointments, meetings, work or from getting things needed for daily living? : No  Safety: Low Risk  (10/12/2024)   Safety   . How often does anyone, including family and friends, physically hurt you?: Never   . How often does anyone, including family and friends, insult or talk down to you?: Never   . How often does anyone, including family and friends, threaten you with harm?: Never   . How often does anyone, including family and friends, scream or curse at you?: Never  Living Situation: Low Risk  (10/12/2024)    Living Situation   . What is your living situation today?: I have a steady place to live   . Think about the place you live. Do you have problems with any of the following? Choose all that apply:: None/None on this list

## 2024-10-26 NOTE — Telephone Encounter (Signed)
 [phone]   I called [facility], she states the nurse is on the other phone line.  She took a message and will have someone return call  ? What is needed

## 2024-11-07 NOTE — Telephone Encounter (Signed)
 Copied from CRM #[id number]. Topic: Clinical Concerns - Home Health/Living Facility >> Nov 07, 2024  2:12 PM Patryce P wrote: Ted [facility] is calling other request    Include all details related to the request(s) below: Ted is calling from [facility] to report that the patient has a bilateral lower extremities edema plus 1 and 2 on left. Plus 1 on the right leg. She is not sore or has any shortness of breath.  Please advise    Confirm and type the Best Contact Number below:  Patient/caller contact number:  [phone]           [] Home  [] Mobile  [] Work [x] Other   [x] Okay to leave a voicemail   Medication List:  Current Outpatient Medications:  .  apixaban (ELIQUIS) 5 mg tab, Take 1 tablet (5 mg total) by mouth 2 (two) times a day., Disp: 60 tablet, Rfl: 2 .  cholestyramine 4 g packet, Take 4 g by mouth 2 (two) times a day., Disp: , Rfl:  .  cholestyramine 4 g packet, Take 4 g by mouth daily., Disp: , Rfl:  .  clobetasoL (TEMOVATE) 0.05 % cream, Apply 1 Application topically 2 (two) times a day. (Patient not taking: Reported on 10/12/2024), Disp: 60 g, Rfl: 0 .  ferrous gluconate 324 mg (38 mg iron ) tab tablet, Take 38 mg of iron  by mouth daily., Disp: , Rfl:  .  levothyroxine  (SYNTHROID ) 112 mcg tablet, TAKE 1 TABLET EVERY MORNING, Disp: 60 tablet, Rfl: 5 .  miscellaneous medical supply misc, Prescription for wheelchair ramp. This is medically necessary for patient., Disp: 1 each, Rfl: 0 .  pantoprazole  (PROTONIX ) 40 mg EC tablet, Take 1 tablet (40 mg total) by mouth 2 (two) times a day. (Patient not taking: Reported on 10/12/2024), Disp: 180 tablet, Rfl: 3 .  predniSONE  (DELTASONE ) 10 mg tablet, Take 10 mg by mouth 2 (two) times a day., Disp: , Rfl:  .  rosuvastatin  (CRESTOR ) 5 mg tablet, TAKE 1 TABLET EVERY DAY, Disp: 90 tablet, Rfl: 3 .  walker misc, Use as directed. Pediatric size requested., Disp: 1 each, Rfl: 0     Medication Request/Refills:  Pharmacy Information (if applicable)   [] Not Applicable       []  Pharmacy listed  Send Medication Request to:                                                 [] Pharmacy not listed (added to pharmacy list in Epic) Send Medication Request to:      Listed Pharmacies: [id number] - [place], [place] - [address] - PHONE: [phone] - FAX: [phone] [facility] - [place], [place] - [address] - PHONE: [phone] - FAX: [phone] [facility] [address] [place] - [address] - PHONE: [phone] - FAX: [phone]

## 2024-11-09 NOTE — Progress Notes (Signed)
 [facility] Return Visit   Referring Provider Lynwood Laneta ORN, PA-C [address] [place],  [place] [address]  Primary Care Provider Lynwood Laneta ORN, PA-C  Patient Profile: Allison Finley is a 73 y.o. female who is seen in consultation in the [facility] at the request of Dr. Lynwood for evaluation and management of the problem(s) noted below.  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  History of Present Illness 73 year old female with past medical history noteworthy for HLD, hypothyroidism, IDA, DVT on Eliquis who presents to the gastroenterology office for evaluation of history of pyloric channel peptic ulcer complicated by perforation and H. pylori infection 06/2024 as well as pan ulcerative colitis diagnosed 04/2024  Allison Finley is accompanied to the office today by her daughter who is an [employer] and provides history in conjunction with her.  She was previously followed by [name].  I met her briefly in the hospital 06/2024 during inpatient rounding coverage.  Current GI Meds  Prednisone  20 mg p.o. twice daily Pantoprazole  40 mg p.o. twice daily Ferrous sulfate  Vitamin B12  Interval History   Pan ulcerative colitis diagnosed 04/2024 - Admitted to [facility] 04/2024 with severe, watery diarrhea -negative stool studies for infectious pathogens - CT angio showed pancolitis - Flexible sigmoidoscopy -diffuse severe inflammation in the rectosigmoid colon -path showed active chronic colitis  consistent with IBD/UC - Treated with IV Solu-Medrol  with response and transition to prednisone  40 mg orally daily - Given trial of mesalamine 1.5 g daily as an outpatient - Received IV iron  500 mg during inpatient hospitalization  - Based on history, it sounds as though her symptoms not fully remit with steroids and mesalamine - Difficult to tease out from history steroid taper - Readmitted to [name] 06/2024 with perforated gastric ulcer -H. pylori infection and steroid use - see below - Treated with IV Solu-Medrol  while inpatient in July - Received 1 dose of infliximab  5 mg/kg 07/15/2024 while inpatient - Intended plan was for her to continue on infliximab  as maintenance therapy but states that it was not administered while she was in physical rehab after her hospitalization due to cost  - She presents to the office today on prednisone  20 mg p.o. twice daily -no maintenance therapy - Stool consistency improved from prior but still having 4-6 bowel movements per day; no blood or mucus - Does endorse fecal urgency; no tenesmus - Denies abdominal pain or cramping - No extraintestinal manifestations of IBD  - Prednisone  side effects include skin tearing, hair loss, and bruising - Previously tried mesalamine; discontinued due to severe weakness - Large supply of mesalamine remains at home - Diagnosed with DVT while in rehab-not a candidate for Jak inhibitor therapy  - Discussed complexities of her care -disease previously not entirely responsive to steroids or mesalamine, lapse in exposure to anti-TNF potentially resulting in antibodies - Daughter queries if she needs biologic therapy - They would like to reattempt a trial of mesalamine; could be a candidate for Velsipity  Perforated peptic ulcer and H. pylori infection - Admitted to [facility] 06/2024  with severe abdominal pain and perforated pyloric channel ulcer - Status post ex lap and Graham patch repair by Dr. Teresa 07/02/2024 -  Diagnosed with H. pylori infection treated with quadruple therapy - Has not had test of cure - Continues on pantoprazole  40 mg p.o. twice daily and queries if adjustments can be made in her medication  - Denies upper GI symptoms of abdominal pain, nausea, vomiting, dysphagia or odynophagia - Followed up with surgery postop 08/12/2024 -doing well and discharged from care  Nutritional deficiencies and supplementation - Vegetarian diet - Iron  deficiency anemia and protein malnutrition - Taking B12, iron  every other day, vitamin C , vitamin D , magnesium citrate, and zinc  - Concern regarding long-term vitamin B12 deficiency - Lost a significant amount of weight due to severe illness with UC and peptic ulcer disease last spring -previous weight 132 pounds and now 113 pounds - Appetite is significantly improved  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 Otherwise negative.  General Review of Systems  Review of systems is significant for the pertinent positives and negatives as listed per the HPI.  Full ROS is otherwise negative.  Inflammatory Bowel Disease History  - Diagnosed with pan ulcerative colitis 04/2024 after developing diffuse, watery diarrhea during hospital admission to [facility].  CT angio with pancolitis.  Flexible sigmoidoscopy severe diffuse colitis in rectosigmoid colon with biopsies consistent with chronic colitis.  Treated with IV Solu-Medrol  and transitioned to prednisone  and oral mesalamine.  Did not have full remission of symptoms. - Readmitted to [name] 06/2024 for perforated peptic channel ulcer while on high-dose prednisone .  Status post ex lap with [name].  Diagnosed with H. pylori infection and treated with quadruple therapy - Received 1 dose of infliximab  5 mg/kg while inpatient at [facility]  -infusions not continued in the outpatient setting due to cost while at physical rehab - 10/2024 -patient establish care at [facility] with mildly active symptoms while on prednisone  and requests a trial of mesalamine  IBD Medication History IV Solu-Medrol  and prednisone  -disease at least partially responsive to corticosteroids Oral mesalamine -initially ineffective at treating disease Infliximab  -received 1 dose of infliximab  06/2024 while inpatient at [facility] with response -not continued due to cost while at rehab facility  Past Medical History   Past Medical History:  Diagnosis Date   Cystocele, unspecified    Diverticulosis    GERD (gastroesophageal reflux disease)    Hematochezia    Hypothyroid    Inflammatory bowel disease (ulcerative colitis) (HCC) 05/20/2024   Iron  deficiency anemia due to chronic blood loss    Psoriasis      Past Surgical History   Past Surgical History:  Procedure Laterality Date   BONE BIOPSY  05/21/2024   Procedure: BIOPSY, GI;  Surgeon: Legrand Victory LITTIE DOUGLAS, MD;  Location: [facility] ENDOSCOPY;  Service: Gastroenterology;;   CATARACT EXTRACTION  2023   FLEXIBLE SIGMOIDOSCOPY N/A 05/21/2024   Procedure: KINGSTON SIDE;  Surgeon: Legrand Victory LITTIE DOUGLAS, MD;  Location: [facility] ENDOSCOPY;  Service: Gastroenterology;  Laterality: N/A;   LAPAROTOMY N/A 07/01/2024   Procedure: Exploratory laparotomy with [name] repair of perforated peptic ulcer;  Surgeon: Teresa Lonni HERO, MD;  Location: [facility] OR;  Service: General;  Laterality: N/A;     Allergies and Medications   Allergies  Allergen Reactions   Nsaids Other (See Comments)  Perforated peptic ulcer   Bovine (Beef) Protein-Containing Drug Products Other (See Comments)    Vegetarian    Chicken Allergy Other (See Comments)    Vegetarian    Porcine (Pork) Protein-Containing Drug Products Other (See Comments)    vegetarian    Current Meds  Medication Sig   acetaminophen  (TYLENOL ) 500 MG tablet Take 2  tablets (1,000 mg total) by mouth every 6 (six) hours as needed for mild pain (pain score 1-3) or fever.   ascorbic acid  (VITAMIN C ) 500 MG tablet Take 1 tablet (500 mg total) by mouth 2 (two) times daily.   clobetasol cream (TEMOVATE) 0.05 % Apply 1 Application topically daily as needed (psoriasis).   Cyanocobalamin (VITAMIN B-12 PO) Take 1 capsule by mouth daily.   ferrous sulfate  325 (65 FE) MG tablet Take 1 tablet (325 mg total) by mouth 2 (two) times daily with a meal.   fluticasone (FLONASE) 50 MCG/ACT nasal spray Place 2 sprays into both nostrils daily as needed for allergies.   levothyroxine  (SYNTHROID ) 112 MCG tablet Take 112 mcg by mouth daily.   Multiple Vitamin (MULTIVITAMIN WITH MINERALS) TABS tablet Take 1 tablet by mouth daily.   pantoprazole  (PROTONIX ) 40 MG tablet Take 1 tablet (40 mg total) by mouth 2 (two) times daily.   predniSONE  (DELTASONE ) 20 MG tablet Take 1 tablet (20 mg total) by mouth 2 (two) times daily with a meal.   rosuvastatin  (CRESTOR ) 5 MG tablet Take 5 mg by mouth at bedtime.   Zinc  Oxide-Vitamin C  (ZINC  PLUS VITAMIN C  PO) Take 1 tablet by mouth daily.     Family History   Family History  Problem Relation Age of Onset   Hearing loss Mother    Rheum arthritis Mother    Hypertension Brother    Hearing loss Brother    COPD Brother      Social History   Social History   Tobacco Use   Smoking status: Never   Smokeless tobacco: Never  Vaping Use   Vaping status: Never Used  Substance Use Topics   Alcohol use: Never   Drug use: Never   Bana reports that she has never smoked. She has never used smokeless tobacco. She reports that she does not drink alcohol and does not use drugs.  Vital Signs and Physical Examination   Vitals:   11/10/24 1029  BP: 112/60  Pulse: 100   Body mass index is 20.78 kg/m. Weight: 113 lb 9.6 oz (51.5 kg)  General: Thin, sitting quietly in exam chair in no distress Head: Normocephalic and atraumatic Eyes:  Sclerae anicteric, EOMI Ears: Normal auditory acuity Mouth: No deformities or lesions noted Lungs: Clear throughout to auscultation Heart: Regular rate and rhythm; No murmurs, rubs or bruits Abdomen: Soft, non tender and non distended. No masses, hepatosplenomegaly or hernias noted. Normal Bowel sounds Rectal: Deferred Musculoskeletal: Symmetrical with no gross deformities, some weakness with ambulation   Review of Data  The following data was reviewed at the time of this encounter:  Laboratory Studies      Latest Ref Rng & Units 07/10/2024    5:04 AM 07/09/2024    4:09 AM 07/08/2024   10:48 AM  CBC  WBC 4.0 - 10.5 K/uL 7.8  6.7  7.5   Hemoglobin 12.0 - 15.0 g/dL 9.5  9.4  9.4   Hematocrit 36.0 - 46.0 % 30.3  30.4  30.1   Platelets 150 - 400 K/uL 211  225  236     Lab Results  Component Value Date   LIPASE <10 (L) 08/02/2021      Latest Ref Rng & Units 07/10/2024    5:04 AM 07/09/2024    4:09 AM 07/08/2024    4:42 AM  CMP  Glucose 70 - 99 mg/dL 849  849    BUN 8 - 23 mg/dL 10  9    Creatinine 9.55 - 1.00 mg/dL 9.67  <9.69    Sodium 864 - 145 mmol/L 133  133    Potassium 3.5 - 5.1 mmol/L 4.4  3.5  4.4   Chloride 98 - 111 mmol/L 96  96    CO2 22 - 32 mmol/L 29  30    Calcium  8.9 - 10.3 mg/dL 8.1  7.9     Lab Results  Component Value Date   IRON  12 (L) 07/02/2024   TIBC 153 (L) 07/02/2024   FERRITIN 299 07/02/2024   Lab Results  Component Value Date   VITAMINB12 >7,500 (H) 07/02/2024   H. pylori stool antigen + 06/2024  Imaging Studies  Upper GI 07/05/2024 Limited study. No evidence of any significant gastric leak or contrast extravasation. Normal passage of contrast into the duodenum and distal small intestine.  CTAP 07/01/2024 1. Large anterior pyloric channel ulcer with a small amount of nearby intraperitoneal gas along the falciform ligament and porta hepatis suspicious for early perforation of a pyloric channel ulcer. Ascites noted eccentric to the right.  Surgical consultation recommended. 2. Abnormal wall thickening throughout the colon the exception of the cecum, compatible with nonspecific colitis. No pneumatosis of the colon is currently identified. 3. Abnormal ascites particularly in the perihepatic region and right paracolic gutter. Trace edema in the omentum diffusely. 4. Right upper abdominal paracentral hernia containing adipose tissue and edema in between the transverse abdominus and oblique musculature. 5. Substantial cystocele with pelvic floor laxity. Likely vaginal prolapse. Low position of the anorectal junction. 6. Lumbar spondylosis and degenerative disc disease causing left foraminal impingement at L4-5 and L5-S1. 7.  Aortic Atherosclerosis (ICD10-I70.0).  CT angio GI bleed 05/19/2024 1. No evidence of active GI bleed. 2. Colitis of the cecum, ascending colon, and rectosigmoid. Follow-up with colonoscopy after resolution of active inflammation recommended to exclude underlying mass. 3. Constipation. No bowel obstruction. 4. Cystocele.   CTAP 08/02/2021 Changes of mild colonic constipation.   Multiple fluid-filled loops of small bowel without obstructive change. This may represent some mild enteritis.   GI Procedures and Studies  Flexible sigmoidoscopy 05/21/2024 Diffuse severe inflammation characterized by decreased vascularity, friability, edema and mucus was found in the rectum and distal sigmoid colon.  Path: Active chronic colitis consistent with IBD  Colonoscopy in 2022 for positive Cologuard Reported to show diverticula but otherwise normal  Clinical Impression  It is my clinical impression that Ms. Co is a 73 y.o. 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 Harsha initially presented to [facility] 04/2024 with abdominal pain and profuse, watery diarrhea, anemia.  CT angio showed pancolitis.  Stool studies negative for enteric pathogens.  Flexible sigmoidoscopy disclosed severe rectosigmoid inflammation with biopsies consistent with IBD/ulcerative colitis.  She was treated with IV Solu-Medrol  and transitioned to oral prednisone .  Maintenance therapy was initiated with oral mesalamine through Dr. Curtis office.  I  do not have his prior records for review but based upon her report and hospital records it does not sound that she achieved remission of disease.  In July 2025 she was readmitted to [facility] with a perforated peptic channel ulcer requiring ex lap and [name].  She was on high-dose prednisone  at that time and also diagnosed with H. pylori infection by stool antigen which likely contributed to ulcer formation.  She was treated with for H. pylori with bismuth  quadruple therapy.  She has not had a test of cure.  She has continued on pantoprazole  40 mg p.o. twice daily.  During her inpatient hospitalization her ulcerative colitis was managed with IV Solu-Medrol  and she received her first induction dose of infliximab  5 mg/kg on 07/15/2024.  The intention was for her to continue on maintenance therapy with infliximab  through Dr. Curtis office.  Since that time she was admitted to rehab due to significant physical debilitation.  While in rehab she was diagnosed with a DVT and is on Eliquis.  She informs me that infliximab  was not continued due to cost while at her rehab facility and inability to receive infusions at that site.  She therefore has continued on prednisone  20 mg p.o. twice daily as sole management for her IBD.  She has been seen in the surgery office for follow-up of her peptic ulcer 07/2024-doing well from this perspective and has been discharged from their care.  At today's  visit we discussed maintenance therapy for her ulcerative colitis.  She is faring better at this time but it does not sound that she is in full remission based upon her bowel frequency.  She and her daughter expressed a concern to avoid biologic therapy if possible and would like a reattempt at mesalamine therapy.  Given that she appears to be in a more stable place, I agreed to a 3-week trial of mesalamine 1.5 g daily which they have at home.  She is to continue on her current regimen of prednisone  and not taper until her disease is in remission.  If she does achieve remission of symptoms with dose optimized oral mesalamine we can consider judicious and slow steroid taper.  If she does not achieve remission after 3 weeks of mesalamine I would consider possibility of Velsipity or revisiting biologic therapy.  Depending upon her clinical course she may or may not need to revisit anti-TNF therapy but could have antibodies at this point given that there was a lapse in her treatment.  Given her age and other medical issues we could consider the possibility of anti-IL 1223 therapy or Entyvio depending upon her response to corticosteroids.  At some point she will require endoscopic restaging when she appears to be in remission and physically and nutritionally improved.  From a peptic ulcer disease standpoint, she is in need of test of cure for her H. pylori treatment and we will provide her with a stool kit today.  I have advised her to continue on current regimen of pantoprazole  40 mg p.o. twice daily.  Pending H. pylori stool test results we can consider possibly dose reducing her pantoprazole  to once daily.  Advised that she should not stop pantoprazole  until we have completed steroid taper.  Would consider an EGD at the time of future colonoscopy for assessment of her upper GI tract with her history of peptic ulcer disease.  During her inpatient hospitalizations her CT imaging has shown atrophy of the pancreatic  body and tail.  No history of  pancreatitis or EPI.  Consider the possibility that EPI could be contributing to loose stools.  Given time limitations today we did not have the opportunity to fully explore her pancreatic issues but can do so at a future visit.  From a nutritional perspective, I would like to update her nutritional parameters.  She will be seeing her PCP later today and I will provide her with a laboratory prescription to update nutritional labs.  Will consider whether or not she should receive additional iron  infusions in the future based upon lab results.  Plan  Lab slip provided for PCP appointment today: CBC, CMP, ESR, CRP, vitamin B12, folate, iron  panel, vitamin D  Submit stool testing for fecal calprotectin and H. pylori stool antigen Continue prednisone  40 mg orally daily Resume home mesalamine 1.5 g daily-daughter to message our office in 3 weeks to report on response Continue pantoprazole  40 mg p.o. twice daily Continue iron  and vitamin B12 supplementation -further recommendations or need for IV iron  pending lab results Monitor weight and anthropometrics DEXA scan ordered today given frailty and chronic prednisone  use Anticipate future re-staging EGD and colonoscopy when IBD disease activity is in remission and patient has improved from a constitutional perspective At future visit will reevaluate findings of pancreatic atrophy on cross-sectional imaging  IBD Health Maintenance  Vaccinations Influenza: PCV 13: PCV15: PCV 20: PCV 21: PPSV23: COVID19: HAV: HBV:  Shingles: HPV: Tdap:  DEXA Ordered 10/2024  Pap Smear   Eye Exam Recommend 2025/2026 due to steroid use  Skin Exam PRN  Surveillance Colonoscopy Surveillance will not be necessary due to age but will need disease activity restaging colonoscopy in future  Tobacco Use None  Depression Screen    Planned Follow Up 8 weeks  The patient or caregiver verbalized understanding of the material covered, with  no barriers to understanding. All questions were answered. Patient or caregiver is agreeable with the plan outlined above.    It was a pleasure to see Kariss.  If you have any questions or concerns regarding this evaluation, do not hesitate to contact me.  Inocente Hausen, MD [facility]   I spent total of 50 minutes in both face-to-face (30 minutes interview) and non-face-to-face (20 minutes chart review, care coordination, documentation)  activities, excluding procedures performed, for the visit on the date of this encounter.

## 2024-11-10 ENCOUNTER — Encounter: Payer: Self-pay | Admitting: Pediatrics

## 2024-11-10 ENCOUNTER — Ambulatory Visit
Admission: RE | Admit: 2024-11-10 | Discharge: 2024-11-10 | Disposition: A | Source: Ambulatory Visit | Attending: Pediatrics | Admitting: Pediatrics

## 2024-11-10 ENCOUNTER — Ambulatory Visit (INDEPENDENT_AMBULATORY_CARE_PROVIDER_SITE_OTHER): Admitting: Pediatrics

## 2024-11-10 VITALS — BP 112/60 | HR 100 | Ht 62.0 in | Wt 113.6 lb

## 2024-11-10 DIAGNOSIS — K51919 Ulcerative colitis, unspecified with unspecified complications: Secondary | ICD-10-CM | POA: Diagnosis not present

## 2024-11-10 DIAGNOSIS — K279 Peptic ulcer, site unspecified, unspecified as acute or chronic, without hemorrhage or perforation: Secondary | ICD-10-CM

## 2024-11-10 DIAGNOSIS — Z7952 Long term (current) use of systemic steroids: Secondary | ICD-10-CM

## 2024-11-10 DIAGNOSIS — A048 Other specified bacterial intestinal infections: Secondary | ICD-10-CM

## 2024-11-10 DIAGNOSIS — Z13820 Encounter for screening for osteoporosis: Secondary | ICD-10-CM

## 2024-11-10 DIAGNOSIS — K8689 Other specified diseases of pancreas: Secondary | ICD-10-CM

## 2024-11-10 DIAGNOSIS — D509 Iron deficiency anemia, unspecified: Secondary | ICD-10-CM

## 2024-11-10 DIAGNOSIS — R634 Abnormal weight loss: Secondary | ICD-10-CM

## 2024-11-10 NOTE — Patient Instructions (Signed)
 Your provider has requested that you have some labs drawn.  Please provide the orders to your PCP.  Due to recent changes in healthcare laws, you may see the results of your imaging and laboratory studies on MyChart before your provider has had a chance to review them.  We understand that in some cases there may be results that are confusing or concerning to you. Not all laboratory results come back in the same time frame and the provider may be waiting for multiple results in order to interpret others.  Please give us  48 hours in order for your provider to thoroughly review all the results before contacting the office for clarification of your results.    Your provider has requested that you have a Dexa Scan before leaving today. Please go to the basement floor to our Radiology department for the test.   Your provider has ordered Diatherix stool testing for you. You have received a kit from our office today containing all necessary supplies to complete this test. Please carefully read the stool collection instructions provided in the kit before opening the accompanying materials. In addition, be sure there is a label providing your full name and date of birth on the puritan opti-swab tube that is supplied in the kit (if you do not see a label with this information on your test tube, please make us  aware before test collection!). After completing the test, you should secure the purtian tube into the specimen biohazard bag. The [facility] E-Req sheet (including date and time of specimen collection) should be placed into the outside pocket of the specimen biohazard bag and returned to the [facility] lab (basement floor of [facility]) within 3 days of collection. Please make sure to give the specimen to a staff member at the lab. DO NOT leave the specimen on the counter.   If the specimen date and time (can be found in the upper right boxed portion of the sheet) are not  filled out on the E-Req sheet, the test will NOT be performed.   Start home supply of Mesalamine.  Take 4 tablets daily.  Continue current doses of prednisone  and Pantoprazole  until further notice.  Follow up in 2 months.  Thank you for entrusting me with your care and for choosing [facility], Dr. Inocente Hausen  _______________________________________________________  If your blood pressure at your visit was 140/90 or greater, please contact your primary care physician to follow up on this.  _______________________________________________________  If you are age 15 or older, your body mass index should be between 23-30. Your Body mass index is 20.78 kg/m. If this is out of the aforementioned range listed, please consider follow up with your Primary Care Provider.  If you are age 22 or younger, your body mass index should be between 19-25. Your Body mass index is 20.78 kg/m. If this is out of the aformentioned range listed, please consider follow up with your Primary Care Provider.   ________________________________________________________  The [facility] providers would like to encourage you to use MYCHART to communicate with providers for non-urgent requests or questions.  Due to long hold times on the telephone, sending your provider a message by [facility] may be a faster and more efficient way to get a response.  Please allow 48 business hours for a response.  Please remember that this is for non-urgent requests.  _______________________________________________________  [facility] is using a team-based approach to care.  Your team is made up of your  doctor and two to three APPS. Our APPS (Nurse Practitioners and Physician Assistants) work with your physician to ensure care continuity for you. They are fully qualified to address your health concerns and develop a treatment plan. They communicate directly with your gastroenterologist to care for you. Seeing the  Advanced Practice Practitioners on your physician's team can help you by facilitating care more promptly, often allowing for earlier appointments, access to diagnostic testing, procedures, and other specialty referrals.

## 2024-11-11 ENCOUNTER — Ambulatory Visit: Payer: Self-pay | Admitting: Pediatrics

## 2024-11-11 NOTE — Progress Notes (Signed)
 Sodium levels have improved but remains slightly low.  Normal kidney function.  Iron  levels are stable.  Unfortunately blood counts were not checked, so I do not have hemoglobin on file.  This is not urgent, and I will recheck all labs in 1 month when I see you in office.

## 2024-11-15 ENCOUNTER — Other Ambulatory Visit

## 2024-11-15 DIAGNOSIS — K51919 Ulcerative colitis, unspecified with unspecified complications: Secondary | ICD-10-CM

## 2024-11-15 DIAGNOSIS — A048 Other specified bacterial intestinal infections: Secondary | ICD-10-CM

## 2024-11-16 LAB — FERRITIN: Ferritin: 278 ng/mL — ABNORMAL HIGH (ref 15–150)

## 2024-11-16 LAB — CBC WITH DIFFERENTIAL/PLATELET
Basophils Absolute: 0.1 x10E3/uL (ref 0.0–0.2)
Basos: 1 %
EOS (ABSOLUTE): 0 x10E3/uL (ref 0.0–0.4)
Eos: 0 %
Hematocrit: 34.6 % (ref 34.0–46.6)
Hemoglobin: 10.7 g/dL — ABNORMAL LOW (ref 11.1–15.9)
Immature Grans (Abs): 0.3 x10E3/uL — ABNORMAL HIGH (ref 0.0–0.1)
Immature Granulocytes: 4 %
Lymphocytes Absolute: 1.2 x10E3/uL (ref 0.7–3.1)
Lymphs: 16 %
MCH: 29.7 pg (ref 26.6–33.0)
MCHC: 30.9 g/dL — ABNORMAL LOW (ref 31.5–35.7)
MCV: 96 fL (ref 79–97)
Monocytes Absolute: 0.9 x10E3/uL (ref 0.1–0.9)
Monocytes: 12 %
Neutrophils Absolute: 5 x10E3/uL (ref 1.4–7.0)
Neutrophils: 67 %
Platelets: 419 x10E3/uL (ref 150–450)
RBC: 3.6 x10E6/uL — ABNORMAL LOW (ref 3.77–5.28)
RDW: 12.8 % (ref 11.7–15.4)
WBC: 7.5 x10E3/uL (ref 3.4–10.8)

## 2024-11-16 LAB — COMPREHENSIVE METABOLIC PANEL WITH GFR
ALT: 21 IU/L (ref 0–32)
AST: 12 IU/L (ref 0–40)
Albumin: 3.4 g/dL — ABNORMAL LOW (ref 3.8–4.8)
Alkaline Phosphatase: 63 IU/L (ref 49–135)
BUN/Creatinine Ratio: 63 — ABNORMAL HIGH (ref 12–28)
BUN: 20 mg/dL (ref 8–27)
Bilirubin Total: <0.2 mg/dL (ref 0.0–1.2)
CO2: 25 mmol/L (ref 20–29)
Calcium: 9.1 mg/dL (ref 8.7–10.3)
Chloride: 98 mmol/L (ref 96–106)
Creatinine, Ser: 0.32 mg/dL — ABNORMAL LOW (ref 0.57–1.00)
Globulin, Total: 2.4 g/dL (ref 1.5–4.5)
Glucose: 113 mg/dL — ABNORMAL HIGH (ref 70–99)
Potassium: 4.9 mmol/L (ref 3.5–5.2)
Sodium: 134 mmol/L (ref 134–144)
Total Protein: 5.8 g/dL — ABNORMAL LOW (ref 6.0–8.5)
eGFR: 110 mL/min/1.73 (ref >59)

## 2024-11-16 LAB — C-REACTIVE PROTEIN: CRP: 17 mg/L — ABNORMAL HIGH (ref 0–10)

## 2024-11-16 LAB — FOLATE: Folate: >20 ng/mL (ref >3.0)

## 2024-11-16 LAB — SEDIMENTATION RATE: Sed Rate: 23 mm/h (ref 0–40)

## 2024-11-16 LAB — IRON AND TIBC
Iron Saturation: 12 % — ABNORMAL LOW (ref 15–55)
Iron: 29 ug/dL (ref 27–139)
Total Iron Binding Capacity: 245 ug/dL — ABNORMAL LOW (ref 250–450)
UIBC: 216 ug/dL (ref 118–369)

## 2024-11-16 LAB — VITAMIN B12: Vitamin B-12: >2000 pg/mL — ABNORMAL HIGH (ref 232–1245)

## 2024-11-16 LAB — VITAMIN D 25 HYDROXY (VIT D DEFICIENCY, FRACTURES): Vit D, 25-Hydroxy: 24.2 ng/mL — ABNORMAL LOW (ref 30.0–100.0)

## 2024-11-17 ENCOUNTER — Encounter: Payer: Self-pay | Admitting: Pediatrics

## 2024-11-19 ENCOUNTER — Ambulatory Visit: Payer: Self-pay | Admitting: Pediatrics

## 2024-11-21 NOTE — Telephone Encounter (Signed)
 Patient messaged asking us  to send labs to her PCP and per your request shared that  she is taking 62.5 MCG vit D (2500 IUs) She was asking if that was enough.

## 2024-11-23 ENCOUNTER — Encounter: Payer: Self-pay | Admitting: Pediatrics

## 2024-11-23 ENCOUNTER — Other Ambulatory Visit

## 2024-11-23 DIAGNOSIS — A048 Other specified bacterial intestinal infections: Secondary | ICD-10-CM

## 2024-11-23 DIAGNOSIS — K51919 Ulcerative colitis, unspecified with unspecified complications: Secondary | ICD-10-CM

## 2024-11-23 DIAGNOSIS — K279 Peptic ulcer, site unspecified, unspecified as acute or chronic, without hemorrhage or perforation: Secondary | ICD-10-CM

## 2024-11-26 LAB — CALPROTECTIN, FECAL: Calprotectin, Fecal: 3960 ug/g — ABNORMAL HIGH (ref 0–120)

## 2024-11-26 LAB — SPECIMEN STATUS REPORT

## 2024-12-07 ENCOUNTER — Other Ambulatory Visit (HOSPITAL_COMMUNITY): Payer: Self-pay | Admitting: Pediatrics

## 2024-12-07 ENCOUNTER — Telehealth (HOSPITAL_COMMUNITY): Payer: Self-pay | Admitting: Pharmacist

## 2024-12-07 ENCOUNTER — Telehealth: Payer: Self-pay | Admitting: Pediatrics

## 2024-12-07 ENCOUNTER — Other Ambulatory Visit: Payer: Self-pay | Admitting: Pediatrics

## 2024-12-07 NOTE — Telephone Encounter (Signed)
 Baseline labs for infliximab  treatment plan  TB skin  test negative on 07/01/2024 (s/p two indeterminate TB gold results)  Hepatitis Panel Hepatitis B surface Ab, Hepatitis B Ag - negative on 06/06/24 Hepatitis C antibody negative on 11/02/2020  Chest x-ray: 07/01/2024 - Minimal left basilar subsegmental atelectasis.  Sherry Pennant, PharmD, MPH, BCPS, CPP Clinical Pharmacist

## 2024-12-07 NOTE — Telephone Encounter (Signed)
 Attempted to reach patient, no answer or voicemail. Sent a clinical cytogeneticist message requesting her preferred infusion site. Advised once site is chosen, orders will be sent and they will obtain insurance authorization and make appointments.

## 2024-12-07 NOTE — Telephone Encounter (Signed)
 Inbound call from patient requesting to speak to the nurse in regards to weather she is needing to make the appointment for the infusion or if we are going to make the appointment for her. Patient is also requesting that we send her to an infusion center that accepts [employer]. Please advise.

## 2024-12-07 NOTE — Telephone Encounter (Signed)
 Dr Suzann- I communicated with Allison Finley through MyChart. She is fine using [facility] center.  They do accept her insurance. (I am new with this,  do you put in the orders?  Assuming infusion center then takes over with insurance and scheduling?) Thank you- Damien

## 2024-12-07 NOTE — Telephone Encounter (Signed)
 Spoke with patient.  Advised orders have been sent to [facility] infusion center. Advised patient to allow 2 weeks for them to call her.  If she has not heard from them after the holidays, to reach out. Telephone number and address provided.

## 2024-12-09 ENCOUNTER — Telehealth: Payer: Self-pay | Admitting: Pharmacy Technician

## 2024-12-09 NOTE — Telephone Encounter (Signed)
 Dr. Suzann / Devki  The Remicade  has been approved and patient will be scheduled as soon as possible.  Auth Submission: APPROVED Site of care: Site of care: [facility] Payer: [employer] MEDICARE Medication & CPT/J Code(s) submitted: Remicade  (Infliximab ) J1745 Diagnosis Code: K51.00 Route of submission (phone, fax, portal):  Phone # Fax # Auth type: Buy/Bill PB Units/visits requested: 5MK/KG 300MG  DAY0, DAY14, DAY42, THEN Q8WKS Reference number: [id number] Approval from: 12/07/24 to 12/21/25

## 2024-12-12 LAB — COMPREHENSIVE METABOLIC PANEL (CC13)

## 2024-12-26 ENCOUNTER — Ambulatory Visit (INDEPENDENT_AMBULATORY_CARE_PROVIDER_SITE_OTHER)

## 2024-12-26 VITALS — BP 105/70 | HR 90 | Temp 97.8°F | Resp 16 | Ht 62.0 in | Wt 126.6 lb

## 2024-12-26 DIAGNOSIS — K5100 Ulcerative (chronic) pancolitis without complications: Secondary | ICD-10-CM

## 2024-12-26 MED ORDER — DIPHENHYDRAMINE HCL 25 MG PO CAPS
25.0000 mg | ORAL_CAPSULE | Freq: Once | ORAL | Status: AC
  Administered 2024-12-26: 25 mg via ORAL
  Filled 2024-12-26: qty 1

## 2024-12-26 MED ORDER — SODIUM CHLORIDE 0.9 % IV SOLN
5.0000 mg/kg | Freq: Once | INTRAVENOUS | Status: AC
  Administered 2024-12-26: 300 mg via INTRAVENOUS
  Filled 2024-12-26: qty 30

## 2024-12-26 MED ORDER — ACETAMINOPHEN 325 MG PO TABS
650.0000 mg | ORAL_TABLET | Freq: Once | ORAL | Status: AC
  Administered 2024-12-26: 650 mg via ORAL
  Filled 2024-12-26: qty 2

## 2024-12-26 MED ORDER — METHYLPREDNISOLONE SODIUM SUCC 40 MG IJ SOLR
40.0000 mg | Freq: Once | INTRAMUSCULAR | Status: AC
  Administered 2024-12-26: 40 mg via INTRAVENOUS
  Filled 2024-12-26: qty 1

## 2024-12-26 NOTE — Progress Notes (Signed)
 Diagnosis:  Ulcerative Colitis  Provider:  Lonna Coder MD  Procedure: IV Infusion  IV Type: Peripheral, IV Location: L Forearm  Remicade  (Infliximab ), Dose: 300 mg  Infusion Start Time: 1157  Infusion Stop Time: 1414  Post Infusion IV Care: Observation period completed and Peripheral IV Discontinued  Discharge: Condition: Good, Destination: Home . AVS Declined  Performed by:  Leita FORBES Miles, LPN

## 2025-01-03 ENCOUNTER — Encounter (HOSPITAL_BASED_OUTPATIENT_CLINIC_OR_DEPARTMENT_OTHER): Payer: Self-pay | Admitting: Physical Therapy

## 2025-01-09 ENCOUNTER — Ambulatory Visit: Admitting: *Deleted

## 2025-01-09 VITALS — BP 127/83 | HR 84 | Temp 98.2°F | Resp 18 | Ht 62.0 in | Wt 125.4 lb

## 2025-01-09 DIAGNOSIS — K5100 Ulcerative (chronic) pancolitis without complications: Secondary | ICD-10-CM | POA: Diagnosis not present

## 2025-01-09 MED ORDER — ACETAMINOPHEN 325 MG PO TABS
650.0000 mg | ORAL_TABLET | Freq: Once | ORAL | Status: AC
  Administered 2025-01-09: 650 mg via ORAL
  Filled 2025-01-09: qty 2

## 2025-01-09 MED ORDER — SODIUM CHLORIDE 0.9 % IV SOLN
5.0000 mg/kg | Freq: Once | INTRAVENOUS | Status: AC
  Administered 2025-01-09: 300 mg via INTRAVENOUS
  Filled 2025-01-09: qty 30

## 2025-01-09 MED ORDER — ALBUTEROL SULFATE HFA 108 (90 BASE) MCG/ACT IN AERS: 2.0000 | INHALATION_SPRAY | Freq: Once | RESPIRATORY_TRACT | Status: DC | PRN

## 2025-01-09 MED ORDER — METHYLPREDNISOLONE SODIUM SUCC 40 MG IJ SOLR
40.0000 mg | Freq: Once | INTRAMUSCULAR | Status: AC
  Administered 2025-01-09: 40 mg via INTRAVENOUS
  Filled 2025-01-09: qty 1

## 2025-01-09 MED ORDER — SODIUM CHLORIDE 0.9 % IV SOLN: Freq: Once | INTRAVENOUS | Status: AC | PRN

## 2025-01-09 MED ORDER — EPINEPHRINE 0.3 MG/0.3ML IJ SOAJ: 0.3000 mg | Freq: Once | INTRAMUSCULAR | Status: DC | PRN

## 2025-01-09 MED ORDER — FAMOTIDINE IN NACL 20-0.9 MG/50ML-% IV SOLN: 20.0000 mg | Freq: Once | INTRAVENOUS | Status: DC | PRN

## 2025-01-09 MED ORDER — METHYLPREDNISOLONE SODIUM SUCC 125 MG IJ SOLR
125.0000 mg | Freq: Once | INTRAMUSCULAR | Status: AC | PRN
  Administered 2025-01-09: 125 mg via INTRAVENOUS

## 2025-01-09 MED ORDER — DIPHENHYDRAMINE HCL 25 MG PO CAPS
25.0000 mg | ORAL_CAPSULE | Freq: Once | ORAL | Status: AC
  Administered 2025-01-09: 25 mg via ORAL
  Filled 2025-01-09: qty 1

## 2025-01-09 MED ORDER — DIPHENHYDRAMINE HCL 50 MG/ML IJ SOLN
50.0000 mg | Freq: Once | INTRAMUSCULAR | Status: AC | PRN
  Administered 2025-01-09: 50 mg via INTRAVENOUS

## 2025-01-09 NOTE — Progress Notes (Addendum)
 "  Chief Complaint:follow-up Primary GI Doctor:Dr. Suzann  HPI: Patient Profile: Allison Finley is a 74 y.o. female who is seen in consultation in the [facility] at the request of Dr. Lynwood for evaluation and management of the problem(s) noted below.   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 Suzann.  She was diagnosed with pan UC 06/2024 by [name] and given 1 dose of infliximab  in the hospital over the summer.  Infliximab  was unfortunately not continued for several months because she was in [facility] rehab and they could not facilitate getting her to infusion.   Dr. Suzann took over her care in November 2025 -fecal Cal was still very elevated despite being on prednisone  40 mg orally daily for many months.   She resumed infliximab  with pre-meds -she got 2 doses 12/26/2024 and again yesterday 01/09/2025.  Infusion center contacted Dr. Suzann yesterday reporting a reaction - Patient is here for her 2nd dose of Remicade  and at Rate (2nd highest) pt started experiencing itching all over/Rash all over back stomach/tongue feels tingling. Vitals signs stable. We have given bolus NS/Benadryl  50mg  IVP/ Solumedrol 125mg .   Symptoms started to improve after receiving hypersensitivity medications.  Infusion was not resumed.    Interval History 74 year old female with past medical history noteworthy for HLD, hypothyroidism, IDA, DVT on Eliquis who presents to the gastroenterology office for follow-up of history of pyloric channel peptic ulcer complicated by perforation and H. pylori infection 06/2024 as well as pan  ulcerative colitis diagnosed 04/2024.  Accompanied by her daughter.  Pan ulcerative colitis diagnosed 04/2024 - Admitted to [facility] 04/2024 with severe, watery diarrhea -negative stool studies for infectious pathogens - CT angio showed pancolitis - Flexible sigmoidoscopy -diffuse severe inflammation in the rectosigmoid colon -path showed active chronic colitis consistent with IBD/UC - Treated with IV Solu-Medrol  with response and transition to prednisone  40 mg orally daily - Given trial of mesalamine 1.5 g daily as an outpatient - Received IV iron  500 mg during inpatient hospitalization   - Based on history, it sounds as though her symptoms not fully remit with steroids and mesalamine - Difficult to tease out from history steroid taper - Readmitted to [name] 06/2024 with perforated gastric ulcer -H. pylori infection and steroid use - see below - Treated with IV Solu-Medrol  while inpatient in July - Received 1 dose of infliximab  5 mg/kg 07/15/2024 while inpatient - Intended plan was for her to continue on infliximab  as maintenance therapy but states that it was not administered while she was in physical rehab after her hospitalization due to cost   -Patient confirmed above information about reaction to infliximab  at infusion center yesterday. Reports she is feeling better today. The rash has resolved. -Patient reports prior to partial infusion yesterday she was having runny diarrhea with occasional red stool mixed in about 25% of the time.  -She would have up to 6-7 BM's per day.  -She does note some urgency.  -She reports her stools were formed yesterday and today.  - She presents to the office today on prednisone  20 mg p.o. twice daily  - No extraintestinal manifestations of IBD   - Prednisone  side effects include  skin tearing, hair loss, and bruising - Previously tried mesalamine; discontinued due to severe weakness - Large supply of mesalamine remains at home - Diagnosed with DVT  while in rehab-not a candidate for Jak inhibitor therapy   - Discussed complexities of her care -disease previously not entirely responsive to steroids or mesalamine, lapse in exposure to anti-TNF potentially resulting in antibodies - Discussed with patient and daughter today about alternative biological therapies.    Perforated peptic ulcer and H. pylori infection - Admitted to [name] 06/2024 with severe abdominal pain and perforated pyloric channel ulcer - Status post ex lap and Graham patch repair by Dr. Teresa 07/02/2024 - Diagnosed with H. pylori infection treated with quadruple therapy - Negative test for cure - Continues on pantoprazole  40 mg p.o. twice daily while on steroids   - Denies upper GI symptoms of abdominal pain, nausea, vomiting, dysphagia or odynophagia - Followed up with surgery postop 08/12/2024 -doing well and discharged from care   Nutritional deficiencies and supplementation - Vegetarian diet - Iron  deficiency anemia and protein malnutrition, expresses concerns with recent blood in stool. 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  CT angio with pancolitis.  Flexible  sigmoidoscopy severe diffuse colitis in rectosigmoid colon with biopsies consistent with chronic colitis.  Treated with IV Solu-Medrol  and transitioned to prednisone  and oral mesalamine.  Did not have full remission of symptoms. - Readmitted to [name] 06/2024 for perforated peptic channel ulcer while on high-dose prednisone .  Status post ex lap with [name].  Diagnosed with H. pylori infection and treated with quadruple therapy - Received 1 dose of infliximab  5 mg/kg while inpatient at [facility] -infusions not continued in the outpatient setting due to cost while at physical rehab - 10/2024 -patient establish care at [facility] with mildly active symptoms while on prednisone  and requests a trial of mesalamine   IBD Medication History IV Solu-Medrol  and prednisone  -disease at least partially responsive to corticosteroids Oral mesalamine -initially ineffective at treating disease Infliximab  -received 1 dose of infliximab  06/2024 while inpatient at [facility] with response -not continued due to cost while at rehab facility   Past Medical History:  Diagnosis Date   Cystocele, unspecified    Diverticulosis    GERD (gastroesophageal reflux disease)    Hematochezia    Hypothyroid    Inflammatory bowel disease (ulcerative colitis) (HCC) 05/20/2024   Iron  deficiency anemia due to chronic blood loss    Psoriasis     Past Surgical History:  Procedure Laterality Date   BONE BIOPSY  05/21/2024   Procedure: BIOPSY, GI;  Surgeon: Legrand Victory LITTIE DOUGLAS, MD;  Location[facility] ENDOSCOPY;  Service: Gastroenterology;;   CATARACT EXTRACTION  2023   FLEXIBLE SIGMOIDOSCOPY N/A 05/21/2024   Procedure: KINGSTON SIDE;  Surgeon: Legrand Victory LITTIE DOUGLAS, MD;  Location: [facility] ENDOSCOPY;  Service: Gastroenterology;  Laterality: N/A;   LAPAROTOMY N/A 07/01/2024   Procedure: Exploratory laparotomy with [name] repair of perforated peptic ulcer;  Surgeon: Teresa Lonni HERO, MD;  Location: [facility] OR;  Service:  General;  Laterality: N/A;    Current Outpatient Medications  Medication Sig Dispense Refill   apixaban (ELIQUIS) 5 MG TABS tablet Take 5 mg by mouth 2 (two) times daily.     ascorbic acid  (VITAMIN C ) 500 MG tablet Take 1 tablet (500 mg total) by mouth 2 (two) times daily.     Cyanocobalamin (VITAMIN B-12 PO) Take 1 capsule by mouth daily.     ferrous sulfate  325 (65 FE) MG tablet Take 1 tablet (325 mg total) by mouth 2 (two) times daily with a meal.     furosemide  (LASIX ) 20 MG tablet Take 10 mg by mouth 2 (two) times daily.     levothyroxine  (SYNTHROID ) 112 MCG tablet Take 112 mcg by mouth daily.     Magnesium 250 MG CAPS Take 1 capsule by mouth at bedtime.     Multiple Vitamin (MULTIVITAMIN WITH MINERALS) TABS tablet Take 1 tablet by mouth daily.     predniSONE  (DELTASONE ) 20 MG tablet Take 1 tablet (20 mg total) by mouth 2 (two) times daily with a meal. (Patient taking differently: Take 10 mg by mouth 2 (two) times daily with a meal.)     rosuvastatin  (CRESTOR ) 5 MG tablet Take 5 mg by mouth at bedtime.     vitamin E 200 UNIT capsule Take 200 Units by mouth daily.     Zinc  Oxide-Vitamin C  (ZINC  PLUS VITAMIN C  PO) Take 1 tablet by mouth daily.     Vitamin D , Ergocalciferol , (DRISDOL ) 1.25 MG (50000 UNIT) CAPS capsule Take 1 capsule (50,000 Units total) by mouth every 7 (seven) days. 5 capsule 0   No current facility-administered medications for this visit.    Allergies as of 01/10/2025 - Review Complete 01/10/2025  Allergen Reaction Noted   Nsaids Other (See Comments) 07/02/2024   Bovine (beef) protein-containing drug products Other (See Comments) 05/19/2024   Chicken allergy Other (See Comments) 05/19/2024   Porcine (pork) protein-containing drug products Other (See Comments) 05/19/2024    Family History  Problem Relation Age of Onset   Hearing loss Mother    Rheum arthritis Mother    Hypertension Brother    Hearing loss Brother    COPD Brother     Review of Systems:     Constitutional: No weight loss, fever, chills, weakness or fatigue HEENT: Eyes: No change in vision               Ears, Nose, Throat:  No change in hearing or congestion Skin: No rash or itching Cardiovascular: No chest pain, chest pressure or palpitations   Respiratory: No SOB or cough Gastrointestinal: See HPI and otherwise negative Genitourinary: No dysuria or change in urinary frequency Neurological: No headache, dizziness or syncope Musculoskeletal: No new muscle or joint pain Hematologic: No bleeding or bruising Psychiatric: No history of depression or anxiety    Physical Exam:  Vital signs: BP 108/66   Pulse 70   Ht 5' 2 (1.575 m)   Wt 128 lb (58.1 kg)   LMP  (LMP Unknown)   BMI 23.41 kg/m   Constitutional:   Pleasant  female appears to be in NAD, Well developed, Well  nourished, alert and cooperative Eyes:   PEERL, EOMI. No icterus. Conjunctiva pink. Neck:  Supple Throat: Oral cavity and pharynx without inflammation, swelling or lesion.  Respiratory: Respirations even and unlabored. Lungs clear to auscultation bilaterally.   No wheezes, crackles, or rhonchi.  Cardiovascular: Normal S1, S2. Regular rate and rhythm. No peripheral edema, cyanosis or pallor.  Gastrointestinal:  Soft, nondistended, nontender. No rebound or guarding. Normal bowel sounds. No appreciable masses or hepatomegaly. Rectal:  Not performed.  Msk:  uses walker Neurologic:  Alert and  oriented x4;  grossly normal neurologically.  Skin:   Dry and intact without significant lesions or rashes.  RELEVANT LABS AND IMAGING: CBC    Latest Ref Rng & Units 11/15/2024    3:20 PM 07/10/2024    5:04 AM 07/09/2024    4:09 AM  CBC  WBC 3.4 - 10.8 x10E3/uL 7.5  7.8  6.7   Hemoglobin 11.1 - 15.9 g/dL 89.2  9.5  9.4   Hematocrit 34.0 - 46.6 % 34.6  30.3  30.4   Platelets 150 - 450 x10E3/uL 419  211  225      CMP     Latest Ref Rng & Units 11/15/2024    3:20 PM 07/10/2024    5:04 AM 07/09/2024    4:09 AM   CMP  Glucose 70 - 99 mg/dL 886  849  849   BUN 8 - 27 mg/dL 20  10  9    Creatinine 0.57 - 1.00 mg/dL 9.67  9.67  <9.69   Sodium 134 - 144 mmol/L 134  133  133   Potassium 3.5 - 5.2 mmol/L 4.9  4.4  3.5   Chloride 96 - 106 mmol/L 98  96  96   CO2 20 - 29 mmol/L 25  29  30    Calcium  8.7 - 10.3 mg/dL 9.1  8.1  7.9   Total Protein 6.0 - 8.5 g/dL 5.8     Total Bilirubin 0.0 - 1.2 mg/dL <9.7     Alkaline Phos 49 - 135 IU/L 63     AST 0 - 40 IU/L 12     ALT 0 - 32 IU/L 21     12/25 labs show: CBC- RBC 3.82, Hgb 11.2, Hct 34.6, CMP- Creat 0.33, BUN 16, Total protein 6.0,, ESR-23, CRP-10.3, vitamin B12-1316, folate->24.8, iron -62,ferritin-50, vitamin D  -17.4  fecal calprotectin-3,960  11/23/24 H. pylori stool antigen -negative  10/2024 DEXA scan: Assessment: Patient has OSTEOPOROSIS according to the [facility] classification for osteoporosis (see below). Fracture risk: high Comments: the technical quality of the study is good Evaluation for secondary causes should be considered if clinically indicated.  Recommend optimizing calcium  (1200 mg/day) and vitamin D  (800 IU/day).  Treatment is indicated. Followup: Repeat BMD is appropriate after 2 years.   H. pylori stool antigen + 06/2024   Imaging Studies  Upper GI 07/05/2024 Limited study. No evidence of any significant gastric leak or contrast extravasation. Normal passage of contrast into the duodenum and distal small intestine.   CTAP 07/01/2024 1. Large anterior pyloric channel ulcer with a small amount of nearby intraperitoneal gas along the falciform ligament and porta hepatis suspicious for early perforation of a pyloric channel ulcer. Ascites noted eccentric to the right. Surgical consultation recommended. 2. Abnormal wall thickening throughout the colon the exception of the cecum, compatible with nonspecific colitis. No pneumatosis of the colon is currently identified. 3. Abnormal ascites particularly in the perihepatic region and  right paracolic gutter. Trace edema in the omentum diffusely.  4. Right upper abdominal paracentral hernia containing adipose tissue and edema in between the transverse abdominus and oblique musculature. 5. Substantial cystocele with pelvic floor laxity. Likely vaginal prolapse. Low position of the anorectal junction. 6. Lumbar spondylosis and degenerative disc disease causing left foraminal impingement at L4-5 and L5-S1. 7.  Aortic Atherosclerosis (ICD10-I70.0).   CT angio GI bleed 05/19/2024 1. No evidence of active GI bleed. 2. Colitis of the cecum, ascending colon, and rectosigmoid. Follow-up with colonoscopy after resolution of active inflammation recommended to exclude underlying mass. 3. Constipation. No bowel obstruction. 4. Cystocele.   CTAP 08/02/2021 Changes of mild colonic constipation.   Multiple fluid-filled loops of small bowel without obstructive change. This Carmellia Kreisler represent some mild enteritis.   GI Procedures and Studies  Flexible sigmoidoscopy 05/21/2024 Diffuse severe inflammation characterized by decreased vascularity, friability, edema and mucus was found in the rectum and distal sigmoid colon.   Path: Active chronic colitis consistent with IBD   Colonoscopy in 2022 for positive Cologuard Reported to show diverticula but otherwise normal  Assessment: It is my clinical impression that Allison Finley is a 74 y.o. 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  CT angio showed pancolitis.   Stool studies negative for enteric pathogens.  Flexible sigmoidoscopy disclosed severe rectosigmoid inflammation with biopsies consistent with IBD/ulcerative colitis.  She was treated with IV Solu-Medrol  and transitioned to oral prednisone .  Maintenance therapy was initiated with oral mesalamine through Dr. Curtis office.  I do not have his prior records for review but based upon her report and hospital records it does not sound that she achieved remission of disease.   In July 2025 she was readmitted to [facility] with a perforated peptic channel ulcer requiring ex lap and [name].  She was on high-dose prednisone  at that time and also diagnosed with H. pylori infection by stool antigen which likely contributed to ulcer formation.  She was treated with for H. pylori with bismuth  quadruple therapy.  We tested her for cure which was negative.  She has continued on pantoprazole  40 mg p.o. twice daily.  During her inpatient hospitalization her ulcerative colitis was managed with IV Solu-Medrol  and she received her first induction dose of infliximab  5 mg/kg on 07/15/2024.  The intention was for her to continue on maintenance therapy with infliximab  through Dr. Curtis office.  Since that time she was admitted to rehab due to significant physical debilitation. While in rehab she was diagnosed with a DVT and is on Eliquis. She informs me that infliximab  was not continued due to cost while at her rehab facility and inability to receive infusions at that site. She therefore has continued on prednisone  20 mg p.o. twice daily as sole management for her IBD. She has been seen in the surgery office for follow-up of her peptic ulcer 07/2024-doing well from this perspective and has been discharged from their care.   Fecal calprotectin was ordered and elevated at 3960 indicating active inflammation in the intestine despite being on steroids.  Patient was started on mesalamine Mesalamine ER 0.375 GM 4 tablets po daily with  no  improvement in her symptoms. Dr. Suzann decided to resumed the infliximab  infusions that she had initially started in July. She resumed infliximab  with pre-meds -she got 2 doses 12/26/2024 and again yesterday 01/09/2025. Patient had reaction to the dose yesterday about half way through the infusion. Pt started experiencing itching all over/Rash all over back stomach/tongue feels tingling. Symptoms started to improve after receiving hypersensitivity medications.  Infusion was not resumed. Suspect she has developed immunogenicity due to drug holiday.   We discussed switching to a new biologic therapy such as Entyvio or Anti-IL 23 therapy. Answered all of the patient and daughters concerns. Daughter is a [employer] and could help with injections at home.   She has a history of DVT so not considering Jak inhibitors.  DEXA scan ordered given frailty and chronic prednisone  use which showed osteoporosis. As per recommendations on the report, made sure patient is optimizing her intake of calcium  1200 mg daily and vitamin D  800 international units daily. Vitamin D  increased due to low vitamin D  level. Pt taking 5,000 units weekly, reports she is running out of prescription, refilled.   From a peptic ulcer disease standpoint,  test of cure for her H. pylori treatment was negative.   I have advised her to decrease the pantoprazole  40 mg p.o. to once daily.  Advised that she should not stop pantoprazole  until we have completed steroid taper.  Would consider an EGD at the time of future colonoscopy for assessment of her upper GI tract with her history of peptic ulcer disease.   During her inpatient hospitalizations her CT imaging has shown atrophy of the pancreatic body and tail. No history of pancreatitis or EPI. Consider the possibility that EPI could be contributing to loose stools. Given time limitations today we did not have the opportunity to fully explore her pancreatic issues but can do so at a future visit.      Plan: 1. Check CBC, CMP  2. At this juncture I would recommend: Anti-IL 23 therapy ([place], Tremfya or Skyrizi) or Entyvio  3. Continue prednisone  40 mg orally daily  4. Continue OTC Calcium  1200 mg daily and vitamin D  5,000units international units daily.  5. Repeat BMD is appropriate after 2 years. 6. Monitor weight and anthropometrics  7. Anticipate future re-staging EGD and colonoscopy when IBD disease activity is in remission and patient has improved from a constitutional perspective 8. At future visit will reevaluate findings of pancreatic atrophy on cross-sectional imaging 9. Follow-up in 3 months with Dr. Suzann per patient request   IBD Health Maintenance  Vaccinations Influenza: PCV 13: PCV15: PCV 20: PCV 21: PPSV23: COVID19: HAV: HBV:  Shingles: HPV: Tdap:  DEXA Ordered 10/2024  Pap Smear    Eye Exam Recommend 2025/2026 due to steroid use  Skin Exam PRN  Surveillance Colonoscopy Surveillance will not be necessary due to age but will need disease activity restaging colonoscopy in future  Tobacco Use None  Depression Screen        Thank you for the courtesy of this consult. Please call me with any questions or concerns.   Allison Delawder, FNP-C [facility] 01/10/2025, 12:11 PM  Cc: Lynwood Laneta ORN, PA-C  I have reviewed the clinic note as outlined by Cathryne Beal, NP and agree with the assessment, plan and medical decision making.  Allison Finley returns to the office today for follow-up of pan ulcerative colitis.  She resumed infliximab  infusions with premedications but unfortunately had a hypersensitivity reaction with rash and tingling in her  throat the day prior to her clinic visit.  Infusion was discontinued and symptoms improved with hypersensitivity protocol medications.  Suspect she has developed immunogenicity given labs and infusions over the summer.  Given the severity of her inflammation, I think she would be best transition to anti-IL 23  therapy.  Entyvio would be a secondary consideration.  Humira/Simponi less likely to have beneficial effect in UC at this severity.  Unfortunately, Jak inhibitor therapy is not an option given her history of recent DVT.  Pending further information regarding insurance coverage further decisions can be made.  Inocente Hausen, MD   "

## 2025-01-09 NOTE — Progress Notes (Signed)
 Diagnosis:  Ulcerative Colitis  Provider:  Mannam, Praveen MD  Procedure: IV Infusion  IV Type: Peripheral, IV Location: L Forearm  , Remicade  (Infliximab ), Dose: 300 mg  Infusion Start Time: 1204 pm  Infusion Stop Time: 1350 pm  Patient complained of rash all across abdomen, back and hands, Remicade  stopped immediately, IV NS started @ 999, Benadryl  50 mg IV given, Solumetrol  125 mg IV given 1350 pm B/P 117/77, 98.2  107 HR  16  Resp      O2 sat 91% 1402 pm B/P 117/78            96 HR    20  Resp     O2 sat 93% 1420 pm B/P  122/84           95 HR    18  Resp     O2 sat 96% Physician was notified, patient waited in observation 30 minutes Rash completely resolved on back and abd, Physician okayed to discharge, will follow up with provider tomorrow 01/20  Post Infusion IV Care: Observation period completed and Peripheral IV Discontinued  Discharge: Condition: Good, Destination: Home . AVS Declined  Performed by:  Trudy Lamarr LABOR, RN

## 2025-01-10 ENCOUNTER — Encounter (HOSPITAL_BASED_OUTPATIENT_CLINIC_OR_DEPARTMENT_OTHER): Payer: Self-pay | Admitting: Physical Therapy

## 2025-01-10 ENCOUNTER — Telehealth: Payer: Self-pay

## 2025-01-10 ENCOUNTER — Ambulatory Visit: Admitting: Gastroenterology

## 2025-01-10 ENCOUNTER — Ambulatory Visit (HOSPITAL_BASED_OUTPATIENT_CLINIC_OR_DEPARTMENT_OTHER): Attending: Family Medicine | Admitting: Physical Therapy

## 2025-01-10 ENCOUNTER — Other Ambulatory Visit: Payer: Self-pay

## 2025-01-10 ENCOUNTER — Ambulatory Visit: Payer: Self-pay | Admitting: Gastroenterology

## 2025-01-10 ENCOUNTER — Other Ambulatory Visit

## 2025-01-10 ENCOUNTER — Encounter: Payer: Self-pay | Admitting: Gastroenterology

## 2025-01-10 VITALS — BP 108/66 | HR 70 | Ht 62.0 in | Wt 128.0 lb

## 2025-01-10 DIAGNOSIS — R2689 Other abnormalities of gait and mobility: Secondary | ICD-10-CM | POA: Diagnosis not present

## 2025-01-10 DIAGNOSIS — Z7409 Other reduced mobility: Secondary | ICD-10-CM | POA: Diagnosis present

## 2025-01-10 DIAGNOSIS — D509 Iron deficiency anemia, unspecified: Secondary | ICD-10-CM | POA: Diagnosis not present

## 2025-01-10 DIAGNOSIS — Z7952 Long term (current) use of systemic steroids: Secondary | ICD-10-CM

## 2025-01-10 DIAGNOSIS — K5730 Diverticulosis of large intestine without perforation or abscess without bleeding: Secondary | ICD-10-CM | POA: Diagnosis not present

## 2025-01-10 DIAGNOSIS — K5100 Ulcerative (chronic) pancolitis without complications: Secondary | ICD-10-CM

## 2025-01-10 DIAGNOSIS — Z8619 Personal history of other infectious and parasitic diseases: Secondary | ICD-10-CM

## 2025-01-10 DIAGNOSIS — K8689 Other specified diseases of pancreas: Secondary | ICD-10-CM

## 2025-01-10 DIAGNOSIS — K625 Hemorrhage of anus and rectum: Secondary | ICD-10-CM | POA: Diagnosis not present

## 2025-01-10 DIAGNOSIS — K259 Gastric ulcer, unspecified as acute or chronic, without hemorrhage or perforation: Secondary | ICD-10-CM | POA: Diagnosis not present

## 2025-01-10 DIAGNOSIS — Z86718 Personal history of other venous thrombosis and embolism: Secondary | ICD-10-CM

## 2025-01-10 DIAGNOSIS — R195 Other fecal abnormalities: Secondary | ICD-10-CM

## 2025-01-10 DIAGNOSIS — M6281 Muscle weakness (generalized): Secondary | ICD-10-CM | POA: Insufficient documentation

## 2025-01-10 DIAGNOSIS — K51919 Ulcerative colitis, unspecified with unspecified complications: Secondary | ICD-10-CM

## 2025-01-10 DIAGNOSIS — Z7901 Long term (current) use of anticoagulants: Secondary | ICD-10-CM

## 2025-01-10 DIAGNOSIS — E559 Vitamin D deficiency, unspecified: Secondary | ICD-10-CM | POA: Diagnosis not present

## 2025-01-10 DIAGNOSIS — R531 Weakness: Secondary | ICD-10-CM | POA: Diagnosis present

## 2025-01-10 DIAGNOSIS — R634 Abnormal weight loss: Secondary | ICD-10-CM

## 2025-01-10 LAB — BASIC METABOLIC PANEL WITH GFR
BUN: 17 mg/dL (ref 6–23)
CO2: 31 meq/L (ref 19–32)
Calcium: 9.2 mg/dL (ref 8.4–10.5)
Chloride: 100 meq/L (ref 96–112)
Creatinine, Ser: 0.38 mg/dL — ABNORMAL LOW (ref 0.40–1.20)
GFR: 99.19 mL/min
Glucose, Bld: 163 mg/dL — ABNORMAL HIGH (ref 70–99)
Potassium: 4.5 meq/L (ref 3.5–5.1)
Sodium: 136 meq/L (ref 135–145)

## 2025-01-10 LAB — CBC WITH DIFFERENTIAL/PLATELET
Basophils Absolute: 0 K/uL (ref 0.0–0.1)
Basophils Relative: 0.1 % (ref 0.0–3.0)
Eosinophils Absolute: 0 K/uL (ref 0.0–0.7)
Eosinophils Relative: 0.1 % (ref 0.0–5.0)
HCT: 31.8 % — ABNORMAL LOW (ref 36.0–46.0)
Hemoglobin: 10.6 g/dL — ABNORMAL LOW (ref 12.0–15.0)
Lymphocytes Relative: 21.9 % (ref 12.0–46.0)
Lymphs Abs: 1.8 K/uL (ref 0.7–4.0)
MCHC: 33.2 g/dL (ref 30.0–36.0)
MCV: 90.1 fl (ref 78.0–100.0)
Monocytes Absolute: 1.6 K/uL — ABNORMAL HIGH (ref 0.1–1.0)
Monocytes Relative: 19.1 % — ABNORMAL HIGH (ref 3.0–12.0)
Neutro Abs: 4.8 K/uL (ref 1.4–7.7)
Neutrophils Relative %: 58.8 % (ref 43.0–77.0)
Platelets: 403 K/uL — ABNORMAL HIGH (ref 150.0–400.0)
RBC: 3.53 Mil/uL — ABNORMAL LOW (ref 3.87–5.11)
RDW: 14.4 % (ref 11.5–15.5)
WBC: 8.2 K/uL (ref 4.0–10.5)

## 2025-01-10 MED ORDER — VITAMIN D (ERGOCALCIFEROL) 1.25 MG (50000 UNIT) PO CAPS: 50000.0000 [IU] | ORAL_CAPSULE | ORAL | 0 refills | Status: AC

## 2025-01-10 NOTE — Patient Instructions (Addendum)
 At this juncture Dr. Suzann would recommend:  1. Anti-IL 23 therapy (Omvoh, Tremfya or Skyrizi) or 2. Entyvio   Can go online and look up information on these medications.  We will find out from insurance what is covered.   Your provider has requested that you go to the basement level for lab work before leaving today. Press B on the elevator. The lab is located at the first door on the left as you exit the elevator.  _______________________________________________________  If your blood pressure at your visit was 140/90 or greater, please contact your primary care physician to follow up on this.  _______________________________________________________  If you are age 46 or older, your body mass index should be between 23-30. Your Body mass index is 23.41 kg/m. If this is out of the aforementioned range listed, please consider follow up with your Primary Care Provider.  If you are age 73 or younger, your body mass index should be between 19-25. Your Body mass index is 23.41 kg/m. If this is out of the aformentioned range listed, please consider follow up with your Primary Care Provider.   ________________________________________________________  The [facility] providers would like to encourage you to use MYCHART to communicate with providers for non-urgent requests or questions.  Due to long hold times on the telephone, sending your provider a message by [facility] may be a faster and more efficient way to get a response.  Please allow 48 business hours for a response.  Please remember that this is for non-urgent requests.  _______________________________________________________  [facility] is using a team-based approach to care.  Your team is made up of your doctor and two to three APPS. Our APPS (Nurse Practitioners and Physician Assistants) work with your physician to ensure care continuity for you. They are fully qualified to address your health concerns and develop a  treatment plan. They communicate directly with your gastroenterologist to care for you. Seeing the Advanced Practice Practitioners on your physician's team can help you by facilitating care more promptly, often allowing for earlier appointments, access to diagnostic testing, procedures, and other specialty referrals.   Thank you for trusting me with your gastrointestinal care. Deanna May, FNP-C

## 2025-01-10 NOTE — Therapy (Signed)
 " OUTPATIENT PHYSICAL THERAPY LOWER EXTREMITY EVALUATION   Patient Name: Allison Finley MRN: [id number] DOB:1951/10/19, 74 y.o., female Today's Date: 01/10/2025  END OF SESSION:  PT End of Session - 01/10/25 1429     Visit Number 1    Number of Visits 16    Date for Recertification  03/07/25    Authorization Type [employer] MEDICARE    Progress Note Due on Visit 10    PT Start Time 1429    PT Stop Time 1512    PT Time Calculation (min) 43 min    Activity Tolerance Patient tolerated treatment well    Behavior During Therapy WFL for tasks assessed/performed          Past Medical History:  Diagnosis Date   Cystocele, unspecified    Diverticulosis    GERD (gastroesophageal reflux disease)    Hematochezia    Hypothyroid    Inflammatory bowel disease (ulcerative colitis) (HCC) 05/20/2024   Iron  deficiency anemia due to chronic blood loss    Psoriasis    Past Surgical History:  Procedure Laterality Date   BONE BIOPSY  05/21/2024   Procedure: BIOPSY, GI;  Surgeon: Legrand Victory LITTIE DOUGLAS, MD;  Location: [facility];  Service: Gastroenterology;;   CATARACT EXTRACTION  2023   FLEXIBLE SIGMOIDOSCOPY N/A 05/21/2024   Procedure: KINGSTON SIDE;  Surgeon: Legrand Victory LITTIE DOUGLAS, MD;  Location: [facility];  Service: Gastroenterology;  Laterality: N/A;   LAPAROTOMY N/A 07/01/2024   Procedure: Exploratory laparotomy with [name] repair of perforated peptic ulcer;  Surgeon: Teresa Lonni HERO, MD;  Location[facility];  Service: General;  Laterality: N/A;   Patient Active Problem List   Diagnosis Date Noted   Chronic pancolonic ulcerative colitis (HCC) 07/09/2024   H. pylori infection 07/09/2024   Protein-calorie malnutrition, severe 07/05/2024   Gastric perforation, acute 07/02/2024   Iron  deficiency anemia due to chronic blood loss 05/22/2024   Cystocele, unspecified 05/22/2024   Inflammatory bowel disease (ulcerative colitis) (HCC) 05/20/2024   Hematochezia  05/19/2024    PCP: Lynwood Laneta ORN, PA-C  REFERRING PROVIDER: Lynwood Laneta ORN, PA-C  REFERRING DIAG: Z74.09 (ICD-10-CM) - Other reduced mobility R53.1 (ICD-10-CM) - Weakness  THERAPY DIAG:  Muscle weakness (generalized)  Other abnormalities of gait and mobility  Rationale for Evaluation and Treatment: Rehabilitation  ONSET DATE: May 2025  SUBJECTIVE:   SUBJECTIVE STATEMENT: Patient states 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 Has been getting stronger since. Wants to work on steps and transition to cane. 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   PERTINENT HISTORY: Osteopenia, HLD, psoriasis, colitis, hx DVT PAIN:  Are you having pain? No  PRECAUTIONS: None  WEIGHT BEARING RESTRICTIONS: No  FALLS:  Has patient fallen in last 6 months? Yes. Number of falls 2 at rehab  PLOF: Independent  PATIENT GOALS: work on steps and transition to cane, improve strength   OBJECTIVE: (objective measures from initial evaluation unless otherwise dated)  PATIENT SURVEYS:  LEFS  Extreme difficulty/unable (0), Quite a bit of difficulty (1), Moderate difficulty (2), Little difficulty (3), No difficulty (4) Survey date:  01/10/25  Any of your usual work, housework or school activities 3  2. Usual hobbies, recreational or sporting activities 1  3. Getting into/out of the bath 3  4. Walking between rooms 4  5. Putting on socks/shoes  4  6. Squatting  2  7. Lifting an object, like a bag of groceries from the floor 3  8. Performing light activities around your home 3  9. Performing heavy activities around your home 1  10. Getting into/out of a car 2  11. Walking 2 blocks 1  12. Walking 1 mile 0  13. Going up/down 10 stairs (1 flight) 0  14. Standing for 1 hour 1  15.  sitting for 1 hour 4  16. Running on even ground 0  17. Running on uneven ground 0   18. Making sharp turns while running fast 0  19. Hopping  0  20. Rolling over in bed 4  Score total:  38/80     COGNITION: Overall cognitive status: Within functional limits for tasks assessed     SENSATION: WFL   POSTURE: rounded shoulders and forward head  PALPATION: Mild LE edema  LOWER EXTREMITY ROM: WFL for tasks assessed  Active ROM Right eval Left eval  Hip flexion    Hip extension    Hip abduction    Hip adduction    Hip internal rotation    Hip external rotation    Knee flexion    Knee extension    Ankle dorsiflexion    Ankle plantarflexion    Ankle inversion    Ankle eversion     (Blank rows = not tested) *= pain/symptoms  LOWER EXTREMITY MMT:  MMT Right eval Left eval  Hip flexion 3+ 3+  Hip extension    Hip abduction    Hip adduction    Hip internal rotation    Hip external rotation    Knee flexion 4- 4-  Knee extension 4 4  Ankle dorsiflexion 4- 4-  Ankle plantarflexion    Ankle inversion    Ankle eversion     (Blank rows = not tested) *= pain/symptoms   FUNCTIONAL TESTS:  5 times sit to stand: 42.95 seconds with heavy UE support, labored, bilateral LE valgus, requires several attempts with most reps Stairs:  7 inch, heavy UE support, labored, unsteady, requires bilateral UE support on 1 rail.   GAIT: Distance walked: 100 feet Assistive device utilized: Single point cane and Walker - 2 wheeled Level of assistance: Modified independence Comments: good cadence with RW, mildly slower with SPC, no LOB but increased time taken   TODAY'S TREATMENT:                                                                                                                              DATE:  01/10/25 Eval, education and HEP    PATIENT EDUCATION:  Education details: Patient educated on exam findings, POC, scope of PT, HEP, relevant anatomy and biomechanics. Person educated: Patient Education method: Explanation, Demonstration, and  Handouts Education comprehension: verbalized understanding, returned demonstration, verbal cues required, and tactile cues required  HOME EXERCISE PROGRAM: Access Code: [email or web address] URL: [email or web address] Date: 01/10/2025 Prepared by: Abad Manard  Exercises - Sit to Stand  - 1 x daily - 7 x weekly - 3 sets - 5 reps - Standing March with Counter Support  - 1 x daily - 7 x weekly - 3 sets - 10 reps - Seated Long Arc Quad  - 1 x daily - 7 x weekly - 2 sets - 10 reps - 5 second hold  ASSESSMENT:  CLINICAL IMPRESSION: Patient a 74 y.o. y.o. female who was seen today for physical therapy evaluation and treatment for weakness and mobility deficits. Patient presents with pain limited deficits in bilateral LE strength, ROM, endurance, activity tolerance, and functional mobility with ADL. Patient is having to modify and restrict ADL as indicated by outcome measure score as well as subjective information and objective measures which is affecting overall participation. Patient will benefit from skilled physical therapy in order to improve function and reduce impairment.  OBJECTIVE IMPAIRMENTS: Abnormal gait, decreased activity tolerance, decreased balance, decreased endurance, decreased mobility, difficulty walking, decreased ROM, decreased strength, increased muscle spasms, impaired flexibility, improper body mechanics, and pain  ACTIVITY LIMITATIONS: lifting, bending, standing, squatting, stairs, transfers, locomotion level, and caring for others  PARTICIPATION LIMITATIONS: meal prep, cleaning, laundry, shopping, community activity, and yard work  PERSONAL FACTORS: Age, Fitness, Time since onset of injury/illness/exacerbation, and 3+ comorbidities: Osteopenia, HLD, psoriasis, colitis, hx DVT are also affecting patient's functional outcome.   REHAB POTENTIAL: Good  CLINICAL DECISION MAKING: Evolving/moderate complexity  EVALUATION COMPLEXITY: Moderate   GOALS: Goals  reviewed with patient? Yes  SHORT TERM GOALS: Target date: 02/07/2025    Patient will be independent with HEP in order to improve functional outcomes. Baseline: Goal status: INITIAL  2.  Patient will report at least 25% improvement in symptoms for improved quality of life. Baseline: Goal status: INITIAL    LONG TERM GOALS: Target date: 03/07/2025    Patient will report at least 75% improvement in symptoms for improved quality of life. Baseline:  Goal status: INITIAL  2.  Patient will improve LEFS score by at least 18 points in order to indicate improved tolerance to activity. Baseline:  Goal status: INITIAL  3.  Patient will be able to navigate stairs with reciprocal pattern without compensation in order to demonstrate improved LE strength. Baseline:  Goal status: INITIAL  4.  Patient will demonstrate grade of 5/5 MMT grade in all tested musculature as evidence of improved strength to assist with stair ambulation and gait.   Baseline:  Goal status: INITIAL  5.  Patient will be able to complete 5x STS in under 11.4 seconds in order to reduce the risk of falls. Baseline:  Goal status: INITIAL     PLAN:  PT FREQUENCY: 1-2x/week  PT DURATION: 8 weeks  PLANNED INTERVENTIONS: 97164- PT Re-evaluation, 97110-Therapeutic exercises, 97530- Therapeutic activity, [id number]- Neuromuscular re-education, 97535- Self Care, [id number]- Manual therapy, [id number]- Gait training, [phone]- Orthotic Fit/training, [phone]- Canalith repositioning, [id number]- Aquatic Therapy, [phone]- Splinting, [phone]- Wound care (first 20 sq cm), 97598- Wound care (each additional 20 sq cm)Patient/Family education, Balance training, Stair training, Taping, Dry Needling, Joint mobilization, Joint manipulation, Spinal manipulation, Spinal mobilization, Scar mobilization, and DME instructions.  PLAN FOR NEXT SESSION: progress LE strength, functional strength with Steps and STS from lower surfaces as able, balance, gait training with  SPC as able   Prentice GORMAN Stains, PT, DPT 01/10/2025, 3:15 PM  "

## 2025-01-10 NOTE — Telephone Encounter (Signed)
Please see note below.   Please review and advise

## 2025-01-10 NOTE — Telephone Encounter (Signed)
-----   Message from [facility], NP sent at 01/09/2025  3:17 PM EST ----- Pod B- Can we find out what her insurance covers for Ulcerative colitis please.  Was on infliximab  - had reactive to infusion x2- stopped and needs to start something different.   She has a history of DVT so not considering Jak inhibitors   Thanks   [place], NP

## 2025-01-12 ENCOUNTER — Other Ambulatory Visit (HOSPITAL_COMMUNITY): Payer: Self-pay

## 2025-01-12 NOTE — Telephone Encounter (Signed)
 Looks like Humira is covered. However, since patient has Medicare, she has a high co-pay but she is eligible for the Medicare Prescription Payment Plan Program. She can call her insurance for more information and to enroll. I can submit for assistance as well.   Please send messages to the RX PRIOR AUTH TEAM pool in the future to prevent delay in patient care

## 2025-01-13 NOTE — Addendum Note (Signed)
 Addended by: LAYMAN MARCEY BRAVO on: 01/13/2025 09:38 AM   Modules accepted: Orders

## 2025-01-16 ENCOUNTER — Inpatient Hospital Stay

## 2025-01-16 ENCOUNTER — Inpatient Hospital Stay: Admitting: Oncology

## 2025-01-17 NOTE — Telephone Encounter (Signed)
 Inbound call from patient requesting an update regarding prior auth for medication. Please advise, thank you

## 2025-01-20 ENCOUNTER — Other Ambulatory Visit (HOSPITAL_COMMUNITY): Payer: Self-pay

## 2025-01-20 NOTE — Telephone Encounter (Signed)
 Skyrizi 360MG  and Tremfya 200MG  are covered. Again, her co-pay is high ($1,850.80) because she has not reached her Medicare out of pocket cost. She can enroll in the Medicare Prescription Payment Plan Program by calling her insurance.

## 2025-01-23 ENCOUNTER — Other Ambulatory Visit (HOSPITAL_COMMUNITY): Payer: Self-pay

## 2025-01-24 ENCOUNTER — Encounter: Payer: Self-pay | Admitting: Pediatrics

## 2025-01-25 ENCOUNTER — Ambulatory Visit (HOSPITAL_BASED_OUTPATIENT_CLINIC_OR_DEPARTMENT_OTHER): Admitting: Physical Therapy

## 2025-01-25 ENCOUNTER — Encounter (HOSPITAL_BASED_OUTPATIENT_CLINIC_OR_DEPARTMENT_OTHER): Payer: Self-pay | Admitting: Physical Therapy

## 2025-01-25 DIAGNOSIS — R2689 Other abnormalities of gait and mobility: Secondary | ICD-10-CM

## 2025-01-25 DIAGNOSIS — M6281 Muscle weakness (generalized): Secondary | ICD-10-CM

## 2025-01-25 NOTE — Therapy (Signed)
 " OUTPATIENT PHYSICAL THERAPY LOWER EXTREMITY TREATMENT    Patient Name: Allison Finley MRN: [id number] DOB:1951-02-12, 74 y.o., female Today's Date: 01/25/2025  END OF SESSION:  PT End of Session - 01/25/25 0836     Visit Number 2    Number of Visits 16    Date for Recertification  03/07/25    Authorization Type [employer]    Authorization Time Period 16 visits 01/10/25 to 03/18/25    Authorization - Visit Number 2    Authorization - Number of Visits 16    Progress Note Due on Visit 10    PT Start Time 0842    PT Stop Time 0924    PT Time Calculation (min) 42 min    Activity Tolerance Patient tolerated treatment well    Behavior During Therapy [facility] for tasks assessed/performed           Past Medical History:  Diagnosis Date   Cystocele, unspecified    Diverticulosis    GERD (gastroesophageal reflux disease)    Hematochezia    Hypothyroid    Inflammatory bowel disease (ulcerative colitis) (HCC) 05/20/2024   Iron  deficiency anemia due to chronic blood loss    Psoriasis    Past Surgical History:  Procedure Laterality Date   BONE BIOPSY  05/21/2024   Procedure: BIOPSY, GI;  Surgeon: Legrand Victory LITTIE DOUGLAS, MD;  Location: [facility];  Service: Gastroenterology;;   CATARACT EXTRACTION  2023   FLEXIBLE SIGMOIDOSCOPY N/A 05/21/2024   Procedure: KINGSTON SIDE;  Surgeon: Legrand Victory LITTIE DOUGLAS, MD;  Location: [facility];  Service: Gastroenterology;  Laterality: N/A;   LAPAROTOMY N/A 07/01/2024   Procedure: Exploratory laparotomy with [name] repair of perforated peptic ulcer;  Surgeon: Teresa Lonni HERO, MD;  Location: [facility];  Service: General;  Laterality: N/A;   Patient Active Problem List   Diagnosis Date Noted   Chronic pancolonic ulcerative colitis (HCC) 07/09/2024   H. pylori infection 07/09/2024   Protein-calorie malnutrition, severe 07/05/2024   Gastric perforation, acute 07/02/2024   Iron  deficiency anemia due to chronic blood loss  05/22/2024   Cystocele, unspecified 05/22/2024   Inflammatory bowel disease (ulcerative colitis) (HCC) 05/20/2024   Hematochezia 05/19/2024    PCP: Lynwood Laneta ORN, PA-C  REFERRING PROVIDER: Lynwood Laneta ORN, PA-C  REFERRING DIAG: Z74.09 (ICD-10-CM) - Other reduced mobility R53.1 (ICD-10-CM) - Weakness  THERAPY DIAG:  Muscle weakness (generalized)  Other abnormalities of gait and mobility  Rationale for Evaluation and Treatment: Rehabilitation  ONSET DATE: May 2025  SUBJECTIVE:   SUBJECTIVE STATEMENT:  Things are going pretty well, still waiting on approval for med for inflammation for IBS. Have been doing well with HEP so far.    EVAL: Patient states 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 Has been getting stronger since. Wants to work on steps and transition to cane. 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   PERTINENT HISTORY: Osteopenia, HLD, psoriasis, colitis, hx DVT PAIN:  Are you having pain? No 0/10, mostly just fatigued   PRECAUTIONS: None  WEIGHT BEARING RESTRICTIONS: No  FALLS:  Has patient fallen in last 6 months? Yes. Number of falls 2 at rehab  PLOF: Independent  PATIENT GOALS: work on steps and transition to cane, improve strength   OBJECTIVE: (objective measures from initial evaluation unless otherwise dated)  PATIENT SURVEYS:  LEFS  Extreme difficulty/unable (0), Quite a bit of difficulty (1), Moderate difficulty (2), Little difficulty (3), No difficulty (4) Survey date:  01/10/25  Any of your usual work, housework or school activities 3  2. Usual hobbies, recreational or sporting activities 1  3. Getting into/out of the bath 3  4. Walking between rooms 4  5. Putting on socks/shoes 4  6. Squatting  2  7. Lifting an object, like a bag of groceries from the floor 3  8. Performing light activities around your  home 3  9. Performing heavy activities around your home 1  10. Getting into/out of a car 2  11. Walking 2 blocks 1  12. Walking 1 mile 0  13. Going up/down 10 stairs (1 flight) 0  14. Standing for 1 hour 1  15.  sitting for 1 hour 4  16. Running on even ground 0  17. Running on uneven ground 0  18. Making sharp turns while running fast 0  19. Hopping  0  20. Rolling over in bed 4  Score total:  38/80     COGNITION: Overall cognitive status: Within functional limits for tasks assessed     SENSATION: WFL   POSTURE: rounded shoulders and forward head  PALPATION: Mild LE edema  LOWER EXTREMITY ROM: WFL for tasks assessed  Active ROM Right eval Left eval  Hip flexion    Hip extension    Hip abduction    Hip adduction    Hip internal rotation    Hip external rotation    Knee flexion    Knee extension    Ankle dorsiflexion    Ankle plantarflexion    Ankle inversion    Ankle eversion     (Blank rows = not tested) *= pain/symptoms  LOWER EXTREMITY MMT:  MMT Right eval Left eval  Hip flexion 3+ 3+  Hip extension    Hip abduction    Hip adduction    Hip internal rotation    Hip external rotation    Knee flexion 4- 4-  Knee extension 4 4  Ankle dorsiflexion 4- 4-  Ankle plantarflexion    Ankle inversion    Ankle eversion     (Blank rows = not tested) *= pain/symptoms   FUNCTIONAL TESTS:  5 times sit to stand: 42.95 seconds with heavy UE support, labored, bilateral LE valgus, requires several attempts with most reps Stairs:  7 inch, heavy UE support, labored, unsteady, requires bilateral UE support on 1 rail.   GAIT: Distance walked: 100 feet Assistive device utilized: Single point cane and Walker - 2 wheeled Level of assistance: Modified independence Comments: good cadence with RW, mildly slower with SPC, no LOB but increased time taken   TODAY'S TREATMENT:                                                                                                                               DATE:   01/25/25  Nustep L4-->3x6 minutes seat 7, all four extremities for w/u and conditioning, rest PRN Walking with 2# each ankle + RW 217ft, SOB and increased WOB noted   LAQs 2# 2x10 B Seated clams yellow TB x15  Standing hip ABD yellow TB 2x10 B  Wide tandem blue foam 4x30 seconds alternating  Narrow BOS blue foam 3x30 seconds  STS with blue foam pad under feet/ high mat table and trying to minimize use of UEs x10    01/10/25 Eval, education and HEP    PATIENT EDUCATION:  Education details: Patient educated on exam findings, POC, scope of PT, HEP, relevant anatomy and biomechanics. Person educated: Patient Education method: Explanation, Demonstration, and Handouts Education comprehension: verbalized understanding, returned demonstration, verbal cues required, and tactile cues required  HOME EXERCISE PROGRAM: Access Code: [email or web address] URL: [email or web address] Date: 01/10/2025 Prepared by: Prentice Zaunegger  Exercises - Sit to Stand  - 1 x daily - 7 x weekly - 3 sets - 5 reps - Standing March with Counter Support  - 1 x daily - 7 x weekly - 3 sets - 10 reps - Seated Long Arc Quad  - 1 x daily - 7 x weekly - 2 sets - 10 reps - 5 second hold  ASSESSMENT:  CLINICAL IMPRESSION:  Arrived today doing well, we focused on a healthy mix of functional strength, conditioning, and balance training today. Generally attempted for RPE 5-6/10 this visit, seemed to tolerate all interventions well but limited by ongoing fatigue today. Still having quite a bit of difficulty with sit to stand transfers but able to complete with extended time/extra effort. Will continue to progress as tolerated.     EVAL: Patient a 74 y.o. y.o. female who was seen today for physical therapy evaluation and treatment for weakness and mobility deficits. Patient presents with pain limited deficits in bilateral LE strength, ROM, endurance, activity tolerance, and  functional mobility with ADL. Patient is having to modify and restrict ADL as indicated by outcome measure score as well as subjective information and objective measures which is affecting overall participation. Patient will benefit from skilled physical therapy in order to improve function and reduce impairment.  OBJECTIVE IMPAIRMENTS: Abnormal gait, decreased activity tolerance, decreased balance, decreased endurance, decreased mobility, difficulty walking, decreased ROM, decreased strength, increased muscle spasms, impaired flexibility, improper body mechanics, and pain  ACTIVITY LIMITATIONS: lifting, bending, standing, squatting, stairs, transfers, locomotion level, and caring for others  PARTICIPATION LIMITATIONS: meal prep, cleaning, laundry, shopping, community activity, and yard work  PERSONAL FACTORS: Age, Fitness, Time since onset of injury/illness/exacerbation, and 3+ comorbidities: Osteopenia, HLD, psoriasis, colitis, hx DVT are also affecting patient's functional outcome.   REHAB POTENTIAL: Good  CLINICAL DECISION MAKING: Evolving/moderate complexity  EVALUATION COMPLEXITY: Moderate   GOALS: Goals reviewed with patient? Yes  SHORT TERM GOALS: Target date: 02/07/2025    Patient will be independent with HEP in order to improve functional outcomes. Baseline: Goal status: INITIAL  2.  Patient will report at least 25% improvement in symptoms for improved quality of life. Baseline: Goal status: INITIAL    LONG TERM GOALS: Target date: 03/07/2025    Patient will report at least 75% improvement in symptoms for improved quality of life. Baseline:  Goal status: INITIAL  2.  Patient will improve LEFS score by at least 18 points in order to indicate improved tolerance to activity. Baseline:  Goal status: INITIAL  3.  Patient will be able to navigate stairs with reciprocal pattern without compensation in order to demonstrate improved  LE strength. Baseline:  Goal status:  INITIAL  4.  Patient will demonstrate grade of 5/5 MMT grade in all tested musculature as evidence of improved strength to assist with stair ambulation and gait.   Baseline:  Goal status: INITIAL  5.  Patient will be able to complete 5x STS in under 11.4 seconds in order to reduce the risk of falls. Baseline:  Goal status: INITIAL     PLAN:  PT FREQUENCY: 1-2x/week  PT DURATION: 8 weeks  PLANNED INTERVENTIONS: 97164- PT Re-evaluation, 97110-Therapeutic exercises, 97530- Therapeutic activity, 97112- Neuromuscular re-education, 97535- Self Care, [id number]- Manual therapy, [phone]- Gait training, [phone]- Orthotic Fit/training, [phone]- Aquatic Therapy, Patient/Family education, Balance training, Stair training, Taping, Dry Needling, Joint mobilization, Joint manipulation, Spinal manipulation, Spinal mobilization, Scar mobilization, and DME instructions.  01/25/25- removed cannalith repositioning, splinting, wound care codes- not approved in insurance auth   PLAN FOR NEXT SESSION: progress LE strength, functional strength with Steps and STS from lower surfaces as able, progress balance, gait training with SPC as able  Josette Rough, PT, DPT 01/25/25 9:26 AM  "

## 2025-01-26 ENCOUNTER — Other Ambulatory Visit (HOSPITAL_COMMUNITY): Payer: Self-pay

## 2025-01-26 NOTE — Telephone Encounter (Signed)
 Patient has been submitted and application is currently pending.

## 2025-02-06 ENCOUNTER — Ambulatory Visit

## 2025-02-07 ENCOUNTER — Encounter (HOSPITAL_BASED_OUTPATIENT_CLINIC_OR_DEPARTMENT_OTHER): Payer: Self-pay | Admitting: Physical Therapy

## 2025-02-07 ENCOUNTER — Inpatient Hospital Stay

## 2025-02-07 ENCOUNTER — Inpatient Hospital Stay: Admitting: Oncology

## 2025-02-09 ENCOUNTER — Encounter (HOSPITAL_BASED_OUTPATIENT_CLINIC_OR_DEPARTMENT_OTHER): Admitting: Physical Therapy

## 2025-02-21 ENCOUNTER — Encounter (HOSPITAL_BASED_OUTPATIENT_CLINIC_OR_DEPARTMENT_OTHER): Admitting: Physical Therapy

## 2025-02-23 ENCOUNTER — Encounter (HOSPITAL_BASED_OUTPATIENT_CLINIC_OR_DEPARTMENT_OTHER): Admitting: Physical Therapy

## 2025-02-27 ENCOUNTER — Encounter (HOSPITAL_BASED_OUTPATIENT_CLINIC_OR_DEPARTMENT_OTHER): Admitting: Physical Therapy

## 2025-03-02 ENCOUNTER — Encounter (HOSPITAL_BASED_OUTPATIENT_CLINIC_OR_DEPARTMENT_OTHER): Admitting: Physical Therapy

## 2025-03-06 ENCOUNTER — Encounter (HOSPITAL_BASED_OUTPATIENT_CLINIC_OR_DEPARTMENT_OTHER): Admitting: Physical Therapy

## 2025-03-08 ENCOUNTER — Encounter (HOSPITAL_BASED_OUTPATIENT_CLINIC_OR_DEPARTMENT_OTHER): Admitting: Physical Therapy

## 2025-03-08 ENCOUNTER — Ambulatory Visit: Admitting: Obstetrics

## 2025-03-13 ENCOUNTER — Encounter (HOSPITAL_BASED_OUTPATIENT_CLINIC_OR_DEPARTMENT_OTHER): Admitting: Physical Therapy

## 2025-03-15 ENCOUNTER — Encounter (HOSPITAL_BASED_OUTPATIENT_CLINIC_OR_DEPARTMENT_OTHER): Admitting: Physical Therapy

## 2025-04-03 ENCOUNTER — Ambulatory Visit
